# Patient Record
Sex: Female | Born: 1980 | Race: White | Hispanic: No | Marital: Single | Smoking: Never smoker
Health system: Southern US, Community
[De-identification: ages and names within clinical notes are randomized; demographics above are authoritative.]

## PROBLEM LIST (undated history)

## (undated) DIAGNOSIS — Z973 Presence of spectacles and contact lenses: Secondary | ICD-10-CM

## (undated) DIAGNOSIS — T753XXA Motion sickness, initial encounter: Secondary | ICD-10-CM

## (undated) DIAGNOSIS — Z800 Family history of malignant neoplasm of digestive organs: Secondary | ICD-10-CM

## (undated) DIAGNOSIS — Z9889 Other specified postprocedural states: Secondary | ICD-10-CM

## (undated) DIAGNOSIS — E282 Polycystic ovarian syndrome: Secondary | ICD-10-CM

## (undated) DIAGNOSIS — B019 Varicella without complication: Secondary | ICD-10-CM

## (undated) DIAGNOSIS — R519 Headache, unspecified: Secondary | ICD-10-CM

## (undated) DIAGNOSIS — R112 Nausea with vomiting, unspecified: Secondary | ICD-10-CM

## (undated) DIAGNOSIS — J45909 Unspecified asthma, uncomplicated: Secondary | ICD-10-CM

## (undated) DIAGNOSIS — R17 Unspecified jaundice: Secondary | ICD-10-CM

## (undated) DIAGNOSIS — C801 Malignant (primary) neoplasm, unspecified: Secondary | ICD-10-CM

## (undated) DIAGNOSIS — J301 Allergic rhinitis due to pollen: Secondary | ICD-10-CM

## (undated) DIAGNOSIS — T7840XA Allergy, unspecified, initial encounter: Secondary | ICD-10-CM

## (undated) DIAGNOSIS — M719 Bursopathy, unspecified: Secondary | ICD-10-CM

## (undated) DIAGNOSIS — Z8489 Family history of other specified conditions: Secondary | ICD-10-CM

## (undated) HISTORY — DX: Allergic rhinitis due to pollen: J30.1

## (undated) HISTORY — DX: Varicella without complication: B01.9

## (undated) HISTORY — PX: TONSILLECTOMY AND ADENOIDECTOMY: SUR1326

## (undated) HISTORY — DX: Polycystic ovarian syndrome: E28.2

## (undated) HISTORY — PX: MYRINGOTOMY WITH TUBE PLACEMENT: SHX5663

## (undated) HISTORY — DX: Family history of malignant neoplasm of digestive organs: Z80.0

## (undated) HISTORY — DX: Bursopathy, unspecified: M71.9

## (undated) HISTORY — DX: Allergy, unspecified, initial encounter: T78.40XA

## (undated) HISTORY — DX: Unspecified asthma, uncomplicated: J45.909

## (undated) HISTORY — DX: Unspecified jaundice: R17

---

## 2004-02-16 HISTORY — PX: EYE SURGERY: SHX253

## 2009-03-17 ENCOUNTER — Emergency Department: Payer: Self-pay | Admitting: Emergency Medicine

## 2013-01-01 ENCOUNTER — Ambulatory Visit: Payer: Self-pay | Admitting: Unknown Physician Specialty

## 2015-04-16 ENCOUNTER — Ambulatory Visit: Payer: No Typology Code available for payment source | Admitting: Nurse Practitioner

## 2015-04-18 ENCOUNTER — Ambulatory Visit (INDEPENDENT_AMBULATORY_CARE_PROVIDER_SITE_OTHER): Payer: No Typology Code available for payment source | Admitting: Nurse Practitioner

## 2015-04-18 ENCOUNTER — Encounter: Payer: Self-pay | Admitting: Nurse Practitioner

## 2015-04-18 VITALS — BP 102/72 | HR 70 | Temp 97.9°F | Ht 67.25 in | Wt 157.5 lb

## 2015-04-18 DIAGNOSIS — M7071 Other bursitis of hip, right hip: Secondary | ICD-10-CM

## 2015-04-18 DIAGNOSIS — R6889 Other general symptoms and signs: Secondary | ICD-10-CM

## 2015-04-18 DIAGNOSIS — E282 Polycystic ovarian syndrome: Secondary | ICD-10-CM | POA: Diagnosis not present

## 2015-04-18 DIAGNOSIS — R5382 Chronic fatigue, unspecified: Secondary | ICD-10-CM

## 2015-04-18 DIAGNOSIS — Z23 Encounter for immunization: Secondary | ICD-10-CM

## 2015-04-18 DIAGNOSIS — Z7689 Persons encountering health services in other specified circumstances: Secondary | ICD-10-CM

## 2015-04-18 DIAGNOSIS — Z1211 Encounter for screening for malignant neoplasm of colon: Secondary | ICD-10-CM

## 2015-04-18 DIAGNOSIS — J4520 Mild intermittent asthma, uncomplicated: Secondary | ICD-10-CM

## 2015-04-18 DIAGNOSIS — Z7189 Other specified counseling: Secondary | ICD-10-CM

## 2015-04-18 LAB — CBC WITH DIFFERENTIAL/PLATELET
BASOS PCT: 0.7 % (ref 0.0–3.0)
Basophils Absolute: 0.1 10*3/uL (ref 0.0–0.1)
EOS PCT: 0.4 % (ref 0.0–5.0)
Eosinophils Absolute: 0 10*3/uL (ref 0.0–0.7)
HCT: 41.8 % (ref 36.0–46.0)
Hemoglobin: 14.2 g/dL (ref 12.0–15.0)
LYMPHS ABS: 1.6 10*3/uL (ref 0.7–4.0)
Lymphocytes Relative: 18.7 % (ref 12.0–46.0)
MCHC: 34 g/dL (ref 30.0–36.0)
MCV: 87.1 fl (ref 78.0–100.0)
MONOS PCT: 5.4 % (ref 3.0–12.0)
Monocytes Absolute: 0.5 10*3/uL (ref 0.1–1.0)
NEUTROS ABS: 6.4 10*3/uL (ref 1.4–7.7)
NEUTROS PCT: 74.8 % (ref 43.0–77.0)
PLATELETS: 272 10*3/uL (ref 150.0–400.0)
RBC: 4.79 Mil/uL (ref 3.87–5.11)
RDW: 13 % (ref 11.5–15.5)
WBC: 8.5 10*3/uL (ref 4.0–10.5)

## 2015-04-18 LAB — COMPREHENSIVE METABOLIC PANEL
ALT: 12 U/L (ref 0–35)
AST: 15 U/L (ref 0–37)
Albumin: 4.8 g/dL (ref 3.5–5.2)
Alkaline Phosphatase: 41 U/L (ref 39–117)
BUN: 8 mg/dL (ref 6–23)
CHLORIDE: 107 meq/L (ref 96–112)
CO2: 23 meq/L (ref 19–32)
Calcium: 9.4 mg/dL (ref 8.4–10.5)
Creatinine, Ser: 0.72 mg/dL (ref 0.40–1.20)
GFR: 98.77 mL/min (ref >60.00)
GLUCOSE: 69 mg/dL — AB (ref 70–99)
POTASSIUM: 4 meq/L (ref 3.5–5.1)
SODIUM: 139 meq/L (ref 135–145)
Total Bilirubin: 0.9 mg/dL (ref 0.2–1.2)
Total Protein: 6.6 g/dL (ref 6.0–8.3)

## 2015-04-18 LAB — TSH: TSH: 1.48 u[IU]/mL (ref 0.35–4.50)

## 2015-04-18 LAB — VITAMIN B12: Vitamin B-12: 143 pg/mL — ABNORMAL LOW (ref 211–911)

## 2015-04-18 LAB — T4, FREE: Free T4: 0.7 ng/dL (ref 0.60–1.60)

## 2015-04-18 NOTE — Patient Instructions (Addendum)
Welcome to [facility]! Nice to meet you.   We will follow up in 3 months OR sooner if needed after labs.

## 2015-04-18 NOTE — Progress Notes (Signed)
Patient ID: Jackie Miller, female    DOB: 1980-04-11  Age: 35 y.o. MRN: [id number]  CC: No chief complaint on file.   HPI Jackie Miller presents for establishing care CC of fatigue and intolerance to cold.   1) New Pt Info:   Immunizations- Tdap and Flu   Pap- 01/15/2015 Dr. Glennon Mac as [facility]   2) Chronic Problems-  Asthma- uses inhaler as needed  PCOS- OCP  Hay Fever- seasonal   Colon screening 2013 Dr. Vira Agar 5-7 yrs to return, no polyps  Right hip bursitis- meloxicam, had an injection    3) Acute Problems-  Last few years, feels cold all of the time, hates the cold weather  Feels very fatigued 7-9 hours on avg sleep, not a restful sleep   PHQ-2 negative   Steadily gaining weight over last 3 years 20-30 lbs     History Jackie Miller has a past medical history of Asthma; Chicken pox; Jaundice; Hay fever; PCOS (polycystic ovarian syndrome); and Bursitis.   She has past surgical history that includes Tonsillectomy and adenoidectomy; Eye surgery (Bilateral, 2006); and Myringotomy with tube placement.   Her family history includes Breast cancer in her maternal grandmother; Colon cancer (age of onset: 93) in her mother; Hypertension in her father; Lung cancer in her paternal grandfather.She reports that she has never smoked. She has never used smokeless tobacco. She reports that she drinks alcohol. She reports that she does not use illicit drugs.  No outpatient prescriptions prior to visit.   No facility-administered medications prior to visit.    ROS Review of Systems  Constitutional: Positive for appetite change and fatigue. Negative for fever, chills, diaphoresis, activity change and unexpected weight change.  Eyes: Negative for visual disturbance.  Respiratory: Negative for chest tightness and shortness of breath.   Cardiovascular: Negative for chest pain.  Gastrointestinal: Negative for nausea, vomiting and diarrhea.  Endocrine: Positive for cold intolerance.  Neurological:  Negative for headaches.  Psychiatric/Behavioral: Positive for sleep disturbance. Negative for suicidal ideas. The patient is nervous/anxious.     Objective:  BP 102/72 mmHg  Pulse 70  Temp(Src) 97.9 F (36.6 C) (Oral)  Ht 5' 7.25" (1.708 m)  Wt 157 lb 8 oz (71.442 kg)  BMI 24.49 kg/m2  SpO2 99%  LMP 03/25/2015  Physical Exam  Constitutional: She is oriented to person, place, and time. She appears well-developed and well-nourished. No distress.  HENT:  Head: Normocephalic and atraumatic.  Right Ear: External ear normal.  Left Ear: External ear normal.  Eyes: Conjunctivae and EOM are normal. Pupils are equal, round, and reactive to light. Right eye exhibits no discharge. Left eye exhibits no discharge. No scleral icterus.  Cardiovascular: Normal rate, regular rhythm and normal heart sounds.  Exam reveals no gallop and no friction rub.   No murmur heard. Pulmonary/Chest: Effort normal and breath sounds normal. No respiratory distress. She has no wheezes. She has no rales. She exhibits no tenderness.  Neurological: She is alert and oriented to person, place, and time. No cranial nerve deficit. She exhibits normal muscle tone. Coordination normal.  Skin: Skin is warm and dry. No rash noted. She is not diaphoretic.  Psychiatric: She has a normal mood and affect. Her behavior is normal. Judgment and thought content normal.   Assessment & Plan:   Diagnoses and all orders for this visit:  Chronic fatigue -     Comprehensive metabolic panel -     123456 -     CBC with Differential/Platelet -  TSH -     T4, free  Need for prophylactic vaccination with combined diphtheria-tetanus-pertussis (DTP) vaccine -     Tdap vaccine greater than or equal to 7yo IM  Encounter to establish care  PCOS (polycystic ovarian syndrome)  Intolerance to cold -     Comprehensive metabolic panel -     123456 -     CBC with Differential/Platelet -     TSH -     T4, free  Asthma, mild intermittent,  uncomplicated  Special screening for malignant neoplasms, colon  Bursitis of right hip  Other orders -     Flu Vaccine QUAD 36+ mos IM   I am having Jackie Miller maintain her meloxicam, montelukast, PROAIR HFA, and Norgestimate-Eth Estradiol (MONONESSA PO).  Meds ordered this encounter  Medications  . meloxicam (MOBIC) 15 MG tablet    Sig:   . montelukast (SINGULAIR) 10 MG tablet    Sig:   . PROAIR HFA 108 (90 Base) MCG/ACT inhaler    Sig: as needed.  Donnetta Hail Estradiol (MONONESSA PO)    Sig: Take by mouth.     Follow-up: Return in about 3 months (around 07/19/2015) for Follow up.

## 2015-04-18 NOTE — Progress Notes (Signed)
Pre visit review using our clinic review tool, if applicable. No additional management support is needed unless otherwise documented below in the visit note. 

## 2015-04-21 ENCOUNTER — Telehealth: Payer: Self-pay | Admitting: *Deleted

## 2015-04-21 NOTE — Telephone Encounter (Signed)
Patient requested lab results Pt contact [phone]

## 2015-04-22 NOTE — Telephone Encounter (Signed)
Notified pt of test results, pt verbalized understanding and appreciation. Pt was also scheduled for nurse visit for first b 12 injection 04/23/15

## 2015-04-23 ENCOUNTER — Ambulatory Visit (INDEPENDENT_AMBULATORY_CARE_PROVIDER_SITE_OTHER): Payer: No Typology Code available for payment source | Admitting: Surgical

## 2015-04-23 DIAGNOSIS — E538 Deficiency of other specified B group vitamins: Secondary | ICD-10-CM | POA: Diagnosis not present

## 2015-04-23 MED ORDER — CYANOCOBALAMIN 1000 MCG/ML IJ SOLN
1000.0000 ug | Freq: Once | INTRAMUSCULAR | Status: AC
  Administered 2015-04-23: 1000 ug via INTRAMUSCULAR

## 2015-04-23 NOTE — Progress Notes (Signed)
Patient given B 12 injection in left deltoid. Patient tolerated well.

## 2015-04-27 DIAGNOSIS — Z23 Encounter for immunization: Secondary | ICD-10-CM | POA: Insufficient documentation

## 2015-04-27 DIAGNOSIS — R6889 Other general symptoms and signs: Secondary | ICD-10-CM | POA: Insufficient documentation

## 2015-04-27 DIAGNOSIS — M7071 Other bursitis of hip, right hip: Secondary | ICD-10-CM | POA: Insufficient documentation

## 2015-04-27 DIAGNOSIS — E282 Polycystic ovarian syndrome: Secondary | ICD-10-CM | POA: Insufficient documentation

## 2015-04-27 DIAGNOSIS — J45909 Unspecified asthma, uncomplicated: Secondary | ICD-10-CM | POA: Insufficient documentation

## 2015-04-27 DIAGNOSIS — R5382 Chronic fatigue, unspecified: Secondary | ICD-10-CM | POA: Insufficient documentation

## 2015-04-27 DIAGNOSIS — Z1211 Encounter for screening for malignant neoplasm of colon: Secondary | ICD-10-CM | POA: Insufficient documentation

## 2015-04-27 NOTE — Assessment & Plan Note (Signed)
Stable currently. Has not had to use proair HFA recently, uses singulair daily

## 2015-04-27 NOTE — Assessment & Plan Note (Signed)
Recent intolerance to cold  Feels cold often despite others feeling "normal temp"  Checking labs today

## 2015-04-27 NOTE — Assessment & Plan Note (Signed)
New onset, but chronic over the last few years. Getting adequate sleep and not due to depression Pt would like lab work Checking thyroid and B12

## 2015-04-27 NOTE — Assessment & Plan Note (Signed)
Made UTD on Tdap today

## 2015-04-27 NOTE — Assessment & Plan Note (Signed)
Stable currently. Pt was on mobic and had an injection in the past. No recent flare

## 2015-04-27 NOTE — Assessment & Plan Note (Signed)
Colon cancer in family hx Pt has been screened in 2013 by Dr. Vira Agar and will return in 5-7 yrs Denies polyps

## 2015-04-27 NOTE — Assessment & Plan Note (Signed)
Discussed acute and chronic issues. Reviewed health maintenance measures, PFSHx, and immunizations. Obtain records from previous facility.   

## 2015-04-27 NOTE — Assessment & Plan Note (Signed)
Stable. Pt is on OCP. Has Dr. Glennon Mac at [facility].GYN and will keep

## 2015-04-30 ENCOUNTER — Other Ambulatory Visit: Payer: Self-pay

## 2015-04-30 ENCOUNTER — Ambulatory Visit (INDEPENDENT_AMBULATORY_CARE_PROVIDER_SITE_OTHER): Payer: No Typology Code available for payment source

## 2015-04-30 DIAGNOSIS — E538 Deficiency of other specified B group vitamins: Secondary | ICD-10-CM | POA: Diagnosis not present

## 2015-04-30 MED ORDER — CYANOCOBALAMIN 1000 MCG/ML IJ SOLN: 1000.0000 ug | INTRAMUSCULAR | Status: DC

## 2015-04-30 MED ORDER — CYANOCOBALAMIN 1000 MCG/ML IJ SOLN
1000.0000 ug | Freq: Once | INTRAMUSCULAR | Status: AC
  Administered 2015-04-30: 1000 ug via INTRAMUSCULAR

## 2015-04-30 MED ORDER — "SYRINGE/NEEDLE (DISP) 25G X 1"" 3 ML MISC": Status: DC

## 2015-04-30 NOTE — Progress Notes (Signed)
Patient came in for her b12 injection.  Received in her Right deltoid.  Patient tolerated well.   Patient is requesting to have her b12 injections to be given at home with her family member that is a Therapist, sports. Ordered supplies and meds per her request, when would you like her to return for labs?  Please advise.

## 2015-05-01 ENCOUNTER — Other Ambulatory Visit: Payer: Self-pay | Admitting: Nurse Practitioner

## 2015-05-01 DIAGNOSIS — E538 Deficiency of other specified B group vitamins: Secondary | ICD-10-CM

## 2015-05-01 MED ORDER — CYANOCOBALAMIN 1000 MCG/ML IJ SOLN: 1000.0000 ug | Freq: Once | INTRAMUSCULAR | Status: DC

## 2015-05-01 MED ORDER — "SYRINGE/NEEDLE (DISP) 25G X 1"" 3 ML MISC": 1.0000 | Status: DC

## 2015-05-01 MED ORDER — CYANOCOBALAMIN 1000 MCG/ML IJ SOLN: 1000.0000 ug | INTRAMUSCULAR | Status: DC

## 2015-05-01 MED ORDER — "SYRINGE/NEEDLE (DISP) 25G X 1"" 3 ML MISC": Status: DC

## 2015-05-01 NOTE — Addendum Note (Signed)
Addended by: Lurlean Nanny on: 05/01/2015 09:33 AM   Modules accepted: Orders

## 2015-05-01 NOTE — Addendum Note (Signed)
Addended by: Lurlean Nanny on: 05/01/2015 10:08 AM   Modules accepted: Orders

## 2015-05-01 NOTE — Telephone Encounter (Signed)
Rx sent through [facility]  

## 2015-05-01 NOTE — Telephone Encounter (Signed)
Rx faxed

## 2015-05-01 NOTE — Progress Notes (Signed)
Note reviewed for B12 injection by T. Roman, Therapist, sports  Pt's 2nd B12 of 3 weekly. Will start Monthly at home and repeat labs in 12 weeks from start. Will have CMA call and set up labs for that time period.   Lorane Gell, NP-C  05/01/2015 1309

## 2015-05-07 ENCOUNTER — Ambulatory Visit: Payer: No Typology Code available for payment source

## 2015-07-09 ENCOUNTER — Other Ambulatory Visit: Payer: No Typology Code available for payment source

## 2015-08-08 ENCOUNTER — Other Ambulatory Visit (INDEPENDENT_AMBULATORY_CARE_PROVIDER_SITE_OTHER): Payer: Managed Care, Other (non HMO)

## 2015-08-08 ENCOUNTER — Encounter (INDEPENDENT_AMBULATORY_CARE_PROVIDER_SITE_OTHER): Payer: Self-pay

## 2015-08-08 DIAGNOSIS — E538 Deficiency of other specified B group vitamins: Secondary | ICD-10-CM

## 2015-08-08 LAB — VITAMIN B12: VITAMIN B 12: 146 pg/mL — AB (ref 211–911)

## 2015-08-29 ENCOUNTER — Telehealth: Payer: Self-pay | Admitting: *Deleted

## 2015-08-29 MED ORDER — CYANOCOBALAMIN 1000 MCG/ML IJ SOLN: 1000.0000 ug | Freq: Once | INTRAMUSCULAR | Status: DC

## 2015-08-29 NOTE — Telephone Encounter (Signed)
Please advise thanks,

## 2015-08-29 NOTE — Telephone Encounter (Signed)
Patient has been doing monthly at home and had initially done 3 week shots.  What would you like her to do now, she will continue to complete home injections.

## 2015-08-29 NOTE — Telephone Encounter (Signed)
Sent refill to pharmacy, spoke with patient and scheduled recheck of labs, thanks

## 2015-08-29 NOTE — Telephone Encounter (Signed)
Patient requested her b12 lab result from 08-08-15

## 2015-08-29 NOTE — Telephone Encounter (Signed)
Once a week for 4 weeks. Then recheck.

## 2015-08-29 NOTE — Telephone Encounter (Signed)
Result low. May need increase in frequency of B12. Will need appt.

## 2015-09-18 ENCOUNTER — Telehealth: Payer: Self-pay | Admitting: *Deleted

## 2015-09-18 MED ORDER — CYANOCOBALAMIN 1000 MCG/ML IJ SOLN: INTRAMUSCULAR | 11 refills | Status: DC

## 2015-09-18 NOTE — Telephone Encounter (Signed)
Resent rx

## 2015-09-18 NOTE — Telephone Encounter (Signed)
Patient stated that her B12 injection Rx could not be filled by the pharmacy due to incorrect dosage. Pt was advised to have injection once a week for 4 weeks, her Rx was for once a month. She requested a Rx sent over to [facility]  Pt contact [phone]

## 2015-09-24 ENCOUNTER — Other Ambulatory Visit: Payer: Managed Care, Other (non HMO)

## 2015-10-15 ENCOUNTER — Telehealth: Payer: Self-pay

## 2015-10-15 DIAGNOSIS — E538 Deficiency of other specified B group vitamins: Secondary | ICD-10-CM

## 2015-10-15 NOTE — Telephone Encounter (Signed)
Pt coming for labs 10/16/15. Need future orders placed. Looks like maybe for B12 check. 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

## 2015-10-16 ENCOUNTER — Other Ambulatory Visit: Payer: Managed Care, Other (non HMO)

## 2015-10-16 NOTE — Telephone Encounter (Signed)
Lab ordered.

## 2015-10-16 NOTE — Telephone Encounter (Signed)
Place order for B12, Dx B12 deficiency.

## 2015-10-23 ENCOUNTER — Encounter (INDEPENDENT_AMBULATORY_CARE_PROVIDER_SITE_OTHER): Payer: Self-pay

## 2015-10-23 ENCOUNTER — Other Ambulatory Visit (INDEPENDENT_AMBULATORY_CARE_PROVIDER_SITE_OTHER): Payer: Managed Care, Other (non HMO)

## 2015-10-23 DIAGNOSIS — E538 Deficiency of other specified B group vitamins: Secondary | ICD-10-CM

## 2015-10-23 LAB — VITAMIN B12: Vitamin B-12: >1500 pg/mL — ABNORMAL HIGH (ref 211–911)

## 2015-10-30 ENCOUNTER — Telehealth: Payer: Self-pay | Admitting: *Deleted

## 2015-10-30 NOTE — Telephone Encounter (Signed)
Pt was given a B12 injection the day before lab appointment that was the reason it was high. Pt is receiving injections at home.

## 2015-10-30 NOTE — Telephone Encounter (Signed)
Please do not have them test that close to injection.

## 2015-10-30 NOTE — Telephone Encounter (Signed)
Patient requested lab results Pt contact [phone]

## 2015-11-14 ENCOUNTER — Telehealth: Payer: Self-pay | Admitting: Family Medicine

## 2015-11-14 NOTE — Telephone Encounter (Signed)
Pt lvm stating that she wants to be referred to an endocrinologist at [facility].No referral in. Please advise.

## 2015-11-17 NOTE — Telephone Encounter (Signed)
Can you please schedule pt to be seen.

## 2015-11-17 NOTE — Telephone Encounter (Signed)
I am unaware of this. Will need an appt.

## 2015-11-17 NOTE — Telephone Encounter (Signed)
Ok. I left pt a message to call the office. Thank you!

## 2015-11-17 NOTE — Telephone Encounter (Signed)
There is nothing in the last note that shows this was talked about. Recommendations on referral?

## 2015-11-18 NOTE — Telephone Encounter (Signed)
Pt called back and is scheduled. Thank you!

## 2015-11-20 ENCOUNTER — Ambulatory Visit: Payer: Managed Care, Other (non HMO) | Admitting: Family Medicine

## 2015-12-05 ENCOUNTER — Ambulatory Visit (INDEPENDENT_AMBULATORY_CARE_PROVIDER_SITE_OTHER): Payer: Managed Care, Other (non HMO) | Admitting: Family Medicine

## 2015-12-05 ENCOUNTER — Encounter: Payer: Self-pay | Admitting: Family Medicine

## 2015-12-05 VITALS — BP 112/82 | HR 104 | Temp 98.0°F | Wt 155.4 lb

## 2015-12-05 DIAGNOSIS — E538 Deficiency of other specified B group vitamins: Secondary | ICD-10-CM

## 2015-12-05 DIAGNOSIS — R6889 Other general symptoms and signs: Secondary | ICD-10-CM

## 2015-12-05 DIAGNOSIS — R5382 Chronic fatigue, unspecified: Secondary | ICD-10-CM

## 2015-12-05 DIAGNOSIS — E282 Polycystic ovarian syndrome: Secondary | ICD-10-CM

## 2015-12-05 NOTE — Assessment & Plan Note (Addendum)
Establish problem, worsening. Etiology remains unclear. Likely component of vitamin B12 deficiency. Etiology of B12 deficiency is unclear. Differential is very broad. No evidence of pharmacologic issues, cardiopulmonary disease, infectious disease. She denies issues with depression. No signs or symptoms to suggest underlying malignancy or rheumatologic process. Possible underlying endocrine disorder. Sending to Endo.

## 2015-12-05 NOTE — Progress Notes (Signed)
Pre visit review using our clinic review tool, if applicable. No additional management support is needed unless otherwise documented below in the visit note. 

## 2015-12-05 NOTE — Progress Notes (Signed)
Subjective:  Patient ID: Jackie Miller, female    DOB: 01-26-1981  Age: 35 y.o. MRN: [id number]  CC: Fatigue, cold intolerance  HPI:  35 year old female presents with the above complaints.  Patient a two-year history of severe fatigue and cold intolerance. Patient has had laboratory workup which was remarkable for vitamin B12 deficiency. Thyroid studies were normal earlier this year. She states that she has a brief improvement in her fatigue the first few days after B12 injection but then it returns. The etiology of her symptoms have been unclear. No reports of fever, chills. She does report that she's been slowly gaining weight. No night sweats. No changes in her bowel or bladder habits. Normal appetite.  Additionally, patient states that she has never had regular menstrual cycles. She states that she does have some mild hirsutism. She has seen her OB/GYN regarding this and was diagnosed with PCOS (she had a negative ultrasound).   Patient is very concerned about her symptoms. She denies issues with depression. Patient desires to see an endocrinologist. She would like to discuss this today.  Social Hx   Social History   Social History  . Marital status: Single    Spouse name: N/A  . Number of children: N/A  . Years of education: N/A   Social History Main Topics  . Smoking status: Never Smoker  . Smokeless tobacco: Never Used  . Alcohol use 0.0 - 0.6 oz/week  . Drug use: No  . Sexual activity: No   Other Topics Concern  . 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  Endocrine: Positive for cold intolerance.  Genitourinary: Positive for menstrual problem.   Objective:  BP 112/82 (BP Location: Right Arm, Patient Position: Sitting, Cuff Size: Normal)   Pulse (!) 104   Temp 98 F (36.7 C) (Oral)   Wt 155 lb 6 oz (70.5 kg)   SpO2 99%    BMI 24.15 kg/m   BP/Weight A999333 [id number]  Systolic BP [id number]  Diastolic BP 82 72  Wt. (Lbs) 155.38 157.5  BMI 24.15 24.49   Physical Exam  Constitutional: She is oriented to person, place, and time. She appears well-developed. No distress.  Neck: Neck supple. No thyromegaly present.  Cardiovascular: Normal rate and regular rhythm.   Pulmonary/Chest: Effort normal. She has no wheezes. She has no rales.  Neurological: She is alert and oriented to person, place, and time.  Psychiatric:  Flat affect.  Vitals reviewed.  Lab Results  Component Value Date   WBC 8.5 04/18/2015   HGB 14.2 04/18/2015   HCT 41.8 04/18/2015   PLT 272.0 04/18/2015   GLUCOSE 69 (L) 04/18/2015   ALT 12 04/18/2015   AST 15 04/18/2015   NA 139 04/18/2015   K 4.0 04/18/2015   CL 107 04/18/2015   CREATININE 0.72 04/18/2015   BUN 8 04/18/2015   CO2 23 04/18/2015   TSH 1.48 04/18/2015   Assessment & Plan:   Problem List Items Addressed This Visit    PCOS (polycystic ovarian syndrome)   Relevant Orders   Ambulatory referral to Endocrinology   Chronic fatigue - Primary    Establish from, worsening. Etiology remains unclear. Likely component of vitamin B12 deficiency. Etiology of B12 deficiency is unclear. Differential is very broad. No evidence of pharmacologic issues, cardiopulmonary disease, infectious  disease. She denies issues with depression. No signs or symptoms to suggest underlying malignancy or rheumatologic process. Possible underlying endocrine disorder. Sending to Endo.       Other Visit Diagnoses    Cold intolerance       Vitamin B12 deficiency          Follow-up: PRN  15 minutes were spent face-to-face with the patient during this encounter and over half of that time was spent discussing potential etiologies for her symptoms, work up by primary care and referral.  [place]

## 2015-12-05 NOTE — Patient Instructions (Signed)
We will call with the referral.  Call with concerns/issues  Take care  Dr. Lacinda Axon

## 2016-01-28 ENCOUNTER — Ambulatory Visit (INDEPENDENT_AMBULATORY_CARE_PROVIDER_SITE_OTHER): Payer: Managed Care, Other (non HMO) | Admitting: Family Medicine

## 2016-01-28 ENCOUNTER — Encounter: Payer: Self-pay | Admitting: Family Medicine

## 2016-01-28 DIAGNOSIS — J209 Acute bronchitis, unspecified: Secondary | ICD-10-CM | POA: Diagnosis not present

## 2016-01-28 MED ORDER — DOXYCYCLINE HYCLATE 100 MG PO TABS: 100.0000 mg | ORAL_TABLET | Freq: Two times a day (BID) | ORAL | 0 refills | Status: DC

## 2016-01-28 MED ORDER — PREDNISONE 50 MG PO TABS: ORAL_TABLET | ORAL | 0 refills | Status: DC

## 2016-01-28 MED ORDER — HYDROCOD POLST-CPM POLST ER 10-8 MG/5ML PO SUER: 5.0000 mL | Freq: Two times a day (BID) | ORAL | 0 refills | Status: DC | PRN

## 2016-01-28 NOTE — Progress Notes (Signed)
Pre visit review using our clinic review tool, if applicable. No additional management support is needed unless otherwise documented below in the visit note. 

## 2016-01-28 NOTE — Assessment & Plan Note (Signed)
New acute problem. Treating with tussionex and Prednisone. PRN Albuterol. Advised that this is likely viral and she will not likely need antibiotics. I did give her a prescription for doxycycline if she fails to improve or worsens. She is in agreement to not take this unless truly needed.

## 2016-01-28 NOTE — Patient Instructions (Signed)
Acute bronchitis. Likely viral.  Take the medications.  Avoid the antibiotic unless you worsen or fail to improve.  Take care  Dr. Lacinda Axon

## 2016-01-28 NOTE — Progress Notes (Signed)
Subjective:  Patient ID: Jackie Miller, female    DOB: May 02, 1980  Age: 35 y.o. MRN: [id number]  CC: Cough  HPI:  35 year old female with a history of asthma presents with the above complaint.  Patient reports a two-week history of cough. Cough is dry/nonproductive. Cough is severe. She reports associated shortness of breath. She has been using albuterol with some brief improvement. No fevers or chills. No other associated symptoms. No other complaints or concerns at this time. 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  Social Hx   Social History   Social History  . Marital status: Single    Spouse name: N/A  . Number of children: N/A  . Years of education: N/A   Social History Main Topics  . Smoking status: Never Smoker  . Smokeless tobacco: Never Used  . Alcohol use 0.0 - 0.6 oz/week  . Drug use: No  . Sexual activity: No   Other Topics Concern  . 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  Respiratory: Positive for cough and shortness of breath.    Objective:  BP 105/64 (BP Location: Left Arm, Patient Position: Sitting, Cuff Size: Normal)   Pulse 73   Temp 97.8 F (36.6 C) (Oral)   Resp 14   Wt 156 lb 8 oz (71 kg)   SpO2 100%   BMI 24.33 kg/m   BP/Weight 01/28/2016 A999333 [id number]  Systolic BP 123456 [id number] A999333  Diastolic BP 64 82 72  Wt. (Lbs) 156.5 155.38 157.5  BMI 24.33 24.15 24.49    Physical Exam  Constitutional: She is oriented to person, place, and time. She appears well-developed. No distress.  Cardiovascular: Normal rate and regular rhythm.   Pulmonary/Chest: Effort normal and breath sounds normal. She has no wheezes.  Neurological: She is alert and oriented to person, place, and time.  Psychiatric:  Flat affect.  Vitals reviewed.  Lab Results  Component  Value Date   WBC 8.5 04/18/2015   HGB 14.2 04/18/2015   HCT 41.8 04/18/2015   PLT 272.0 04/18/2015   GLUCOSE 69 (L) 04/18/2015   ALT 12 04/18/2015   AST 15 04/18/2015   NA 139 04/18/2015   K 4.0 04/18/2015   CL 107 04/18/2015   CREATININE 0.72 04/18/2015   BUN 8 04/18/2015   CO2 23 04/18/2015   TSH 1.48 04/18/2015    Assessment & Plan:   Problem List Items Addressed This Visit    Acute bronchitis    New acute problem. Treating with tussionex and Prednisone. PRN Albuterol. Advised that this is likely viral and she will not likely need antibiotics. I did give her a prescription for doxycycline if she fails to improve or worsens. She is in agreement to not take this unless truly needed.         Meds ordered this encounter  Medications  . chlorpheniramine-HYDROcodone (TUSSIONEX PENNKINETIC ER) 10-8 MG/5ML SUER    Sig: Take 5 mLs by mouth every 12 (twelve) hours as needed.    Dispense:  115 mL    Refill:  0  . predniSONE (DELTASONE) 50 MG tablet    Sig: 1 tablet daily x 5 days.    Dispense:  5 tablet    Refill:  0  . doxycycline (VIBRA-TABS) 100  MG tablet    Sig: Take 1 tablet (100 mg total) by mouth 2 (two) times daily.    Dispense:  14 tablet    Refill:  0    Follow-up: PRN  [place]

## 2016-02-25 DIAGNOSIS — E282 Polycystic ovarian syndrome: Secondary | ICD-10-CM | POA: Insufficient documentation

## 2016-03-15 ENCOUNTER — Encounter: Payer: Self-pay | Admitting: Emergency Medicine

## 2016-03-15 ENCOUNTER — Emergency Department
Admission: EM | Admit: 2016-03-15 | Discharge: 2016-03-15 | Disposition: A | Discharge status: Home / Self Care | Payer: Managed Care, Other (non HMO) | Attending: Emergency Medicine | Admitting: Emergency Medicine

## 2016-03-15 DIAGNOSIS — J45909 Unspecified asthma, uncomplicated: Secondary | ICD-10-CM | POA: Insufficient documentation

## 2016-03-15 DIAGNOSIS — K529 Noninfective gastroenteritis and colitis, unspecified: Secondary | ICD-10-CM | POA: Diagnosis not present

## 2016-03-15 DIAGNOSIS — R112 Nausea with vomiting, unspecified: Secondary | ICD-10-CM | POA: Diagnosis present

## 2016-03-15 DIAGNOSIS — Z791 Long term (current) use of non-steroidal anti-inflammatories (NSAID): Secondary | ICD-10-CM | POA: Insufficient documentation

## 2016-03-15 LAB — COMPREHENSIVE METABOLIC PANEL
ALBUMIN: 4.3 g/dL (ref 3.5–5.0)
ALT: 13 U/L — ABNORMAL LOW (ref 14–54)
ANION GAP: 7 (ref 5–15)
AST: 20 U/L (ref 15–41)
Alkaline Phosphatase: 44 U/L (ref 38–126)
BILIRUBIN TOTAL: 1.6 mg/dL — AB (ref 0.3–1.2)
BUN: 7 mg/dL (ref 6–20)
CO2: 23 mmol/L (ref 22–32)
Calcium: 8.9 mg/dL (ref 8.9–10.3)
Chloride: 107 mmol/L (ref 101–111)
Creatinine, Ser: 0.73 mg/dL (ref 0.44–1.00)
GFR calc Af Amer: >60 mL/min (ref >60)
GFR calc non Af Amer: >60 mL/min (ref >60)
Glucose, Bld: 109 mg/dL — ABNORMAL HIGH (ref 65–99)
POTASSIUM: 4.1 mmol/L (ref 3.5–5.1)
Sodium: 137 mmol/L (ref 135–145)
TOTAL PROTEIN: 6.8 g/dL (ref 6.5–8.1)

## 2016-03-15 LAB — URINALYSIS, COMPLETE (UACMP) WITH MICROSCOPIC
BILIRUBIN URINE: NEGATIVE
Glucose, UA: NEGATIVE mg/dL
HGB URINE DIPSTICK: NEGATIVE
Ketones, ur: NEGATIVE mg/dL
Leukocytes, UA: NEGATIVE
NITRITE: NEGATIVE
Protein, ur: 30 mg/dL — AB
Specific Gravity, Urine: 1.02 (ref 1.005–1.030)
pH: 9 — ABNORMAL HIGH (ref 5.0–8.0)

## 2016-03-15 LAB — CBC
HEMATOCRIT: 39.8 % (ref 35.0–47.0)
Hemoglobin: 14 g/dL (ref 12.0–16.0)
MCH: 29.9 pg (ref 26.0–34.0)
MCHC: 35.2 g/dL (ref 32.0–36.0)
MCV: 85.1 fL (ref 80.0–100.0)
Platelets: 199 10*3/uL (ref 150–440)
RBC: 4.68 MIL/uL (ref 3.80–5.20)
RDW: 12.8 % (ref 11.5–14.5)
WBC: 10.8 10*3/uL (ref 3.6–11.0)

## 2016-03-15 LAB — LIPASE, BLOOD: Lipase: 27 U/L (ref 11–51)

## 2016-03-15 LAB — POCT PREGNANCY, URINE: PREG TEST UR: NEGATIVE

## 2016-03-15 MED ORDER — ONDANSETRON HCL 4 MG PO TABS: 4.0000 mg | ORAL_TABLET | Freq: Three times a day (TID) | ORAL | 0 refills | Status: DC | PRN

## 2016-03-15 MED ORDER — SODIUM CHLORIDE 0.9 % IV BOLUS (SEPSIS)
1000.0000 mL | Freq: Once | INTRAVENOUS | Status: AC
  Administered 2016-03-15: 1000 mL via INTRAVENOUS

## 2016-03-15 MED ORDER — ONDANSETRON 4 MG PO TBDP
4.0000 mg | ORAL_TABLET | Freq: Once | ORAL | Status: AC | PRN
  Administered 2016-03-15: 4 mg via ORAL
  Filled 2016-03-15: qty 1

## 2016-03-15 MED ORDER — ONDANSETRON HCL 4 MG/2ML IJ SOLN
4.0000 mg | Freq: Once | INTRAMUSCULAR | Status: AC
  Administered 2016-03-15: 4 mg via INTRAVENOUS
  Filled 2016-03-15: qty 2

## 2016-03-15 NOTE — ED Notes (Signed)
Iv dc'ed.  D/c inst to pt 

## 2016-03-15 NOTE — ED Provider Notes (Signed)
[facility] Emergency Department Provider Note  ____________________________________________   I have reviewed the triage vital signs and the nursing notes.   HISTORY  Chief Complaint Emesis and Abdominal Pain   History limited by: Not Limited   HPI Jackie Miller is a 36 y.o. female who presents to the emergency department today because of concerns for vomiting and diarrhea. Her symptoms started this morning. She has had multiple episodes of diarrhea and vomiting. Greater than 10 for both. Both of been nonbloody. It was initially accompanied by some abdominal discomfort which was generalized throughout her abdomen. She denies any current abdominal pain. She denies any fevers.   Past Medical History:  Diagnosis Date  . Asthma   . Bursitis   . Chicken pox   . Hay fever   . Jaundice    as a baby  . PCOS (polycystic ovarian syndrome)     Patient Active Problem List   Diagnosis Date Noted  . Acute bronchitis 01/28/2016  . Chronic fatigue 04/27/2015  . PCOS (polycystic ovarian syndrome) 04/27/2015  . 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 Asthma 04/27/2015    Past Surgical History:  Procedure Laterality Date  . EYE SURGERY Bilateral 2006   lasik's  . MYRINGOTOMY WITH TUBE PLACEMENT    . TONSILLECTOMY AND ADENOIDECTOMY      Prior to Admission medications   Medication Sig Start Date End Date Taking? Authorizing Provider  chlorpheniramine-HYDROcodone (TUSSIONEX PENNKINETIC ER) 10-8 MG/5ML SUER Take 5 mLs by mouth every 12 (twelve) hours as needed. 01/28/16   Coral Spikes, DO  cyanocobalamin (,VITAMIN B-12,) 1000 MCG/ML injection 1000mg  IM once a week for four weeks then every 30days there after. 09/18/15   Coral Spikes, DO  doxycycline (VIBRA-TABS) 100 MG tablet Take 1 tablet (100 mg total) by mouth 2 (two) times daily. 01/28/16   Coral Spikes, DO  meloxicam (MOBIC) 15 MG tablet  10/03/14   Historical Provider, MD  montelukast (SINGULAIR) 10 MG tablet   10/03/14   Historical Provider, MD  Norgestimate-Eth Estradiol (MONONESSA PO) Take by mouth.    Historical Provider, MD  predniSONE (DELTASONE) 50 MG tablet 1 tablet daily x 5 days. 01/28/16   Jayce G Cook, DO  PROAIR HFA 108 [phone] Base) MCG/ACT inhaler as needed. 01/27/15   Historical Provider, MD  SYRINGE-NEEDLE, DISP, 3 ML 25G X 1" 3 ML MISC Inject 1 each into the muscle once a week. 05/01/15   Rubbie Battiest, NP    Allergies Amoxicillin and Bacitracin  Family History  Problem Relation Age of Onset  . Colon cancer Mother 69  . Hypertension Father   . Breast cancer Maternal Grandmother   . Lung cancer Paternal Grandfather     Social History Social History  Substance Use Topics  . Smoking status: Never Smoker  . Smokeless tobacco: Never Used  . Alcohol use 0.0 - 0.6 oz/week    Review of Systems  Constitutional: Negative for fever. Cardiovascular: Negative for chest pain. Respiratory: Negative for shortness of breath. Gastrointestinal: Positive for vomiting and diarrhea. Genitourinary: Negative for dysuria. Musculoskeletal: Negative for back pain. Skin: Negative for rash. Neurological: Negative for headaches, focal weakness or numbness.  10-point ROS otherwise negative.  ____________________________________________   PHYSICAL EXAM:  VITAL SIGNS: ED Triage Vitals  Enc Vitals Group     BP 03/15/16 1451 122/71     Pulse Rate 03/15/16 1451 (!) 113     Resp 03/15/16 1451 18     Temp 03/15/16  1451 99 F (37.2 C)     Temp Source 03/15/16 1451 Oral     SpO2 03/15/16 1451 100 %     Weight 03/15/16 1454 155 lb (70.3 kg)     Height 03/15/16 1454 5\' 7"  (1.702 m)     Head Circumference --      Peak Flow --      Pain Score 03/15/16 1454 4     Pain Loc --      Pain Edu? --      Excl. in [place]? --      Constitutional: Alert and oriented. Well appearing and in no distress. Eyes: Conjunctivae are normal. Normal extraocular movements. ENT   Head: Normocephalic and  atraumatic.   Nose: No congestion/rhinnorhea.   Mouth/Throat: Mucous membranes are moist.   Neck: No stridor. Hematological/Lymphatic/Immunilogical: No cervical lymphadenopathy. Cardiovascular: Normal rate, regular rhythm.  No murmurs, rubs, or gallops.  Respiratory: Normal respiratory effort without tachypnea nor retractions. Breath sounds are clear and equal bilaterally. No wheezes/rales/rhonchi. Gastrointestinal: Soft and non tender. No rebound. No guarding.  Genitourinary: Deferred Musculoskeletal: Normal range of motion in all extremities. No lower extremity edema. Neurologic:  Normal speech and language. No gross focal neurologic deficits are appreciated.  Skin:  Skin is warm, dry and intact. No rash noted. Psychiatric: Mood and affect are normal. Speech and behavior are normal. Patient exhibits appropriate insight and judgment.  ____________________________________________    LABS (pertinent positives/negatives)  Labs Reviewed  COMPREHENSIVE METABOLIC PANEL - Abnormal; Notable for the following:       Result Value   Glucose, Bld 109 (*)    ALT 13 (*)    Total Bilirubin 1.6 (*)    All other components within normal limits  URINALYSIS, COMPLETE (UACMP) WITH MICROSCOPIC - Abnormal; Notable for the following:    Color, Urine YELLOW (*)    APPearance CLEAR (*)    pH 9.0 (*)    Protein, ur 30 (*)    Bacteria, UA RARE (*)    Squamous Epithelial / LPF 0-5 (*)    All other components within normal limits  LIPASE, BLOOD  CBC  POC URINE PREG, ED  POCT PREGNANCY, URINE     ____________________________________________   EKG  None  ____________________________________________    RADIOLOGY  None  ____________________________________________   PROCEDURES  Procedures  ____________________________________________   INITIAL IMPRESSION / ASSESSMENT AND PLAN / ED COURSE  Pertinent labs & imaging results that were available during my care of the patient  were reviewed by me and considered in my medical decision making (see chart for details).  Patient with nausea, vomiting and diarrhea. Blood work without any concerning leukocytosis or elevated creatinine. Think likely gastroenteritis. Will give medication for nausea.   ____________________________________________   FINAL CLINICAL IMPRESSION(S) / ED DIAGNOSES  Final diagnoses:  Gastroenteritis     Note: This dictation was prepared with Dragon dictation. Any transcriptional errors that result from this process are unintentional     Nance Pear, MD 03/15/16 1806

## 2016-03-15 NOTE — ED Triage Notes (Signed)
Patient to ER for c/o N/V/D for last 12 hours.

## 2016-03-15 NOTE — ED Notes (Signed)
Pt states I feel better .  Pt drinking sprite now.  Family with pt.

## 2016-03-15 NOTE — ED Notes (Signed)
Pt reports abd pain with vomiting and diarrhea that began this morning.  Intermittent abd pain.  No back pain.  Denies urinary sx.  No vag bleeding .  Pt alert.  Family with pt.

## 2016-03-15 NOTE — Discharge Instructions (Signed)
Please seek medical attention for any high fevers, chest pain, shortness of breath, change in behavior, persistent vomiting, bloody stool or any other new or concerning symptoms.  

## 2016-05-17 ENCOUNTER — Telehealth: Payer: Self-pay | Admitting: Family Medicine

## 2016-05-18 ENCOUNTER — Ambulatory Visit (INDEPENDENT_AMBULATORY_CARE_PROVIDER_SITE_OTHER): Payer: Managed Care, Other (non HMO) | Admitting: Family

## 2016-05-18 ENCOUNTER — Encounter: Payer: Self-pay | Admitting: Family

## 2016-05-18 VITALS — BP 118/76 | HR 71 | Temp 98.0°F | Ht 67.0 in | Wt 160.2 lb

## 2016-05-18 DIAGNOSIS — J209 Acute bronchitis, unspecified: Secondary | ICD-10-CM | POA: Diagnosis not present

## 2016-05-18 MED ORDER — HYDROCOD POLST-CPM POLST ER 10-8 MG/5ML PO SUER: 5.0000 mL | Freq: Every evening | ORAL | 0 refills | Status: DC | PRN

## 2016-05-18 MED ORDER — PREDNISONE 10 MG PO TABS: ORAL_TABLET | ORAL | 0 refills | Status: DC

## 2016-05-18 MED ORDER — BENZONATATE 100 MG PO CAPS: 100.0000 mg | ORAL_CAPSULE | Freq: Two times a day (BID) | ORAL | 0 refills | Status: DC | PRN

## 2016-05-18 NOTE — Assessment & Plan Note (Signed)
Working diagnosis of viral URI x 10 days. No acute respiratory distress. Afebrile. Patient and I agreed upon conservative therapy at this time with symptom management, close observation, and delayed antibiotic treatment. Due to history of asthma and prior responses to prednisone, short  Prednisone course given as well.

## 2016-05-18 NOTE — Progress Notes (Signed)
Subjective:    Patient ID: Jackie Miller, female    DOB: [birth year], 36 y.o.   MRN: [id number]  CC: Jackie Miller is a 36 y.o. female who presents today for an acute visit.    HPI: CC: dry cough x 10 days, worse.  Cough worse at night. Endorses clear runny nose and chest tightness with cough. No CP, wheezing, sob, fever, body aches, sinus pain, Ha, vision changes.   Tyelonol cold and mucinex without relief. Inhaler doesn't offer relief.    h/o asthma      HISTORY:  Past Medical History:  Diagnosis Date  . Asthma   . Bursitis   . Chicken pox   . Hay fever   . Jaundice    as a baby  . PCOS (polycystic ovarian syndrome)    Past Surgical History:  Procedure Laterality Date  . EYE SURGERY Bilateral 2006   lasik's  . MYRINGOTOMY WITH TUBE PLACEMENT    . TONSILLECTOMY AND ADENOIDECTOMY     Family History  Problem Relation Age of Onset  . Colon cancer Mother 67  . Hypertension Father   . Breast cancer Maternal Grandmother   . Lung cancer Paternal Grandfather     Allergies: Amoxicillin and Bacitracin Current Outpatient Prescriptions on File Prior to Visit  Medication Sig Dispense Refill  . meloxicam (MOBIC) 15 MG tablet     . montelukast (SINGULAIR) 10 MG tablet     . PROAIR HFA 108 (90 Base) MCG/ACT inhaler as needed.     No current facility-administered medications on file prior to visit.     Social History  Substance Use Topics  . Smoking status: Never Smoker  . Smokeless tobacco: Never Used  . Alcohol use 0.0 - 0.6 oz/week    Review of Systems  Constitutional: Negative for chills and fever.  HENT: Positive for congestion.   Respiratory: Positive for cough and chest tightness. Negative for shortness of breath and wheezing.   Cardiovascular: Negative for chest pain and palpitations.  Gastrointestinal: Negative for nausea and vomiting.      Objective:    BP 118/76   Pulse 71   Temp 98 F (36.7 C) (Oral)   Ht 5\' 7"  (1.702 m)   Wt 160 lb 3.2 oz (72.7  kg)   SpO2 99%   BMI 25.09 kg/m    Physical Exam  Constitutional: She appears well-developed and well-nourished.  HENT:  Head: Normocephalic and atraumatic.  Right Ear: Hearing, tympanic membrane, external ear and ear canal normal. No drainage, swelling or tenderness. No foreign bodies. Tympanic membrane is not erythematous and not bulging. No middle ear effusion. No decreased hearing is noted.  Left Ear: Hearing, tympanic membrane, external ear and ear canal normal. No drainage, swelling or tenderness. No foreign bodies. Tympanic membrane is not erythematous and not bulging.  No middle ear effusion. No decreased hearing is noted.  Nose: Nose normal. No rhinorrhea. Right sinus exhibits no maxillary sinus tenderness and no frontal sinus tenderness. Left sinus exhibits no maxillary sinus tenderness and no frontal sinus tenderness.  Mouth/Throat: Uvula is midline, oropharynx is clear and moist and mucous membranes are normal. No oropharyngeal exudate, posterior oropharyngeal edema, posterior oropharyngeal erythema or tonsillar abscesses.  Eyes: Conjunctivae are normal.  Cardiovascular: Regular rhythm, normal heart sounds and normal pulses.   Pulmonary/Chest: Effort normal and breath sounds normal. She has no wheezes. She has no rhonchi. She has no rales.  Lymphadenopathy:       Head (right side): No  submental, no submandibular, no tonsillar, no preauricular, no posterior auricular and no occipital adenopathy present.       Head (left side): No submental, no submandibular, no tonsillar, no preauricular, no posterior auricular and no occipital adenopathy present.    She has no cervical adenopathy.  Neurological: She is alert.  Skin: Skin is warm and dry.  Psychiatric: She has a normal mood and affect. Her speech is normal and behavior is normal. Thought content normal.  Vitals reviewed.  Patient felt significantly better after albuterol treatment. Lung sounds clear and increased       Assessment & Plan:   Problem List Items Addressed This Visit      Respiratory   Acute bronchitis - Primary    Working diagnosis of viral URI x 10 days. No acute respiratory distress. Afebrile. Patient and I agreed upon conservative therapy at this time with symptom management, close observation, and delayed antibiotic treatment. Due to history of asthma and prior responses to prednisone, short  Prednisone course given as well.        Relevant Medications   chlorpheniramine-HYDROcodone (TUSSIONEX PENNKINETIC ER) 10-8 MG/5ML SUER   benzonatate (TESSALON) 100 MG capsule   predniSONE (DELTASONE) 10 MG tablet        I have discontinued Ms. Kreger's Norgestimate-Eth Estradiol (MONONESSA PO), SYRINGE-NEEDLE (DISP) 3 ML, cyanocobalamin, chlorpheniramine-HYDROcodone, predniSONE, doxycycline, and ondansetron. I am also having her maintain her meloxicam, montelukast, PROAIR HFA, metFORMIN, and bromocriptine.   Meds ordered this encounter  Medications  . metFORMIN (GLUCOPHAGE-XR) 500 MG 24 hr tablet    Sig: Take by mouth.  . bromocriptine (PARLODEL) 2.5 MG tablet    Sig: Take by mouth.    Return precautions given.   Risks, benefits, and alternatives of the medications and treatment plan prescribed today were discussed, and patient expressed understanding.   Education regarding symptom management and diagnosis given to patient on AVS.  Continue to follow with Coral Spikes, DO for routine health maintenance.   Jackie Miller and I agreed with plan.   Mable Paris, FNP

## 2016-05-18 NOTE — Patient Instructions (Signed)
Suspect viral  Prednisone taper  Tessalon as needed for DAY time cough  Tussionex as needed for NIGHT time cough  Keep using inhaler  Mucinex if congestion is thick  Let me know if not better   Acute Bronchitis, Adult Acute bronchitis is sudden (acute) swelling of the air tubes (bronchi) in the lungs. Acute bronchitis causes these tubes to fill with mucus, which can make it hard to breathe. It can also cause coughing or wheezing. In adults, acute bronchitis usually goes away within 2 weeks. A cough caused by bronchitis may last up to 3 weeks. Smoking, allergies, and asthma can make the condition worse. Repeated episodes of bronchitis may cause further lung problems, such as chronic obstructive pulmonary disease (COPD). What are the causes? This condition can be caused by germs and by substances that irritate the lungs, including:  Cold and flu viruses. This condition is most often caused by the same virus that causes a cold.  Bacteria.  Exposure to tobacco smoke, dust, fumes, and air pollution. What increases the risk? This condition is more likely to develop in people who:  Have close contact with someone with acute bronchitis.  Are exposed to lung irritants, such as tobacco smoke, dust, fumes, and vapors.  Have a weak immune system.  Have a respiratory condition such as asthma. What are the signs or symptoms? Symptoms of this condition include:  A cough.  Coughing up clear, yellow, or green mucus.  Wheezing.  Chest congestion.  Shortness of breath.  A fever.  Body aches.  Chills.  A sore throat. How is this diagnosed? This condition is usually diagnosed with a physical exam. During the exam, your health care provider may order tests, such as chest X-rays, to rule out other conditions. He or she may also:  Test a sample of your mucus for bacterial infection.  Check the level of oxygen in your blood. This is done to check for pneumonia.  Do a chest X-ray  or lung function testing to rule out pneumonia and other conditions.  Perform blood tests. Your health care provider will also ask about your symptoms and medical history. How is this treated? Most cases of acute bronchitis clear up over time without treatment. Your health care provider may recommend:  Drinking more fluids. Drinking more makes your mucus thinner, which may make it easier to breathe.  Taking a medicine for a fever or cough.  Taking an antibiotic medicine.  Using an inhaler to help improve shortness of breath and to control a cough.  Using a cool mist vaporizer or humidifier to make it easier to breathe. Follow these instructions at home: Medicines   Take over-the-counter and prescription medicines only as told by your health care provider.  If you were prescribed an antibiotic, take it as told by your health care provider. Do not stop taking the antibiotic even if you start to feel better. General instructions   Get plenty of rest.  Drink enough fluids to keep your urine clear or pale yellow.  Avoid smoking and secondhand smoke. Exposure to cigarette smoke or irritating chemicals will make bronchitis worse. If you smoke and you need help quitting, ask your health care provider. Quitting smoking will help your lungs heal faster.  Use an inhaler, cool mist vaporizer, or humidifier as told by your health care provider.  Keep all follow-up visits as told by your health care provider. This is important. How is this prevented? To lower your risk of getting this condition again:  Wash your hands often with soap and water. If soap and water are not available, use hand sanitizer.  Avoid contact with people who have cold symptoms.  Try not to touch your hands to your mouth, nose, or eyes.  Make sure to get the flu shot every year. Contact a health care provider if:  Your symptoms do not improve in 2 weeks of treatment. Get help right away if:  You cough up  blood.  You have chest pain.  You have severe shortness of breath.  You become dehydrated.  You faint or keep feeling like you are going to faint.  You keep vomiting.  You have a severe headache.  Your fever or chills gets worse. This information is not intended to replace advice given to you by your health care provider. Make sure you discuss any questions you have with your health care provider. Document Released: 03/11/2004 Document Revised: 08/27/2015 Document Reviewed: 07/23/2015 Elsevier Interactive Patient Education  2017 [employer].

## 2016-09-20 ENCOUNTER — Ambulatory Visit
Admission: RE | Admit: 2016-09-20 | Payer: Managed Care, Other (non HMO) | Source: Ambulatory Visit | Admitting: Unknown Physician Specialty

## 2016-09-20 ENCOUNTER — Encounter: Admission: RE | Payer: Self-pay | Source: Ambulatory Visit

## 2016-09-20 SURGERY — COLONOSCOPY WITH PROPOFOL
Anesthesia: General

## 2016-09-22 ENCOUNTER — Encounter: Payer: Self-pay | Admitting: Family Medicine

## 2016-09-22 ENCOUNTER — Ambulatory Visit (INDEPENDENT_AMBULATORY_CARE_PROVIDER_SITE_OTHER): Payer: Managed Care, Other (non HMO) | Admitting: Family Medicine

## 2016-09-22 DIAGNOSIS — J209 Acute bronchitis, unspecified: Secondary | ICD-10-CM

## 2016-09-22 MED ORDER — HYDROCOD POLST-CPM POLST ER 10-8 MG/5ML PO SUER: 5.0000 mL | Freq: Two times a day (BID) | ORAL | 0 refills | Status: DC | PRN

## 2016-09-22 MED ORDER — PREDNISONE 50 MG PO TABS: ORAL_TABLET | ORAL | 0 refills | Status: DC

## 2016-09-22 MED ORDER — AZITHROMYCIN 250 MG PO TABS: ORAL_TABLET | ORAL | 0 refills | Status: DC

## 2016-09-22 MED ORDER — PROAIR HFA 108 (90 BASE) MCG/ACT IN AERS: 2.0000 | INHALATION_SPRAY | Freq: Four times a day (QID) | RESPIRATORY_TRACT | 1 refills | Status: DC | PRN

## 2016-09-22 NOTE — Progress Notes (Signed)
Subjective:  Patient ID: Jackie Miller, female    DOB: 04/15/1980  Age: 36 y.o. MRN: [id number]  CC: Cough, chest tightness  HPI:  36 year old female with a history of asthma and bronchitis presents with the above complaints.  Patient reports that she has been sick for the past 2 weeks. She's had severe cough and chest tightness. Worse at night. No associated fever. No chills. No shortness of breath. She is use over-the-counter Mucinex with no improvement. No no relieving factors. No other associated symptoms. No other complaints or concerns this time.  Social Hx   Social History   Social History  . Marital status: Single    Spouse name: N/A  . Number of children: N/A  . Years of education: N/A   Social History Main Topics  . Smoking status: Never Smoker  . Smokeless tobacco: Never Used  . Alcohol use 0.0 - 0.6 oz/week  . Drug use: No  . Sexual activity: No   Other Topics Concern  . None   Social History Narrative   Therapist, nutritional-    Engineering geologist at [employer]   Caffeine- 1 cup soda     Review of Systems  Constitutional: Negative.   Respiratory: Positive for cough and chest tightness.    Objective:  BP 106/74 (BP Location: Left Arm, Patient Position: Sitting, Cuff Size: Normal)   Pulse 63   Temp 98.1 F (36.7 C) (Oral)   Wt 157 lb (71.2 kg)   SpO2 99%   BMI 24.59 kg/m   BP/Weight 09/22/2016 05/18/2016 8/92/1194  Systolic BP 174 081 448  Diastolic BP 74 76 66  Wt. (Lbs) 157 160.2 155  BMI 24.59 25.09 24.28    Physical Exam  Constitutional: She is oriented to person, place, and time. She appears well-developed. No distress.  HENT:  Head: Normocephalic and atraumatic.  Mouth/Throat: Oropharynx is clear and moist.  Eyes: Conjunctivae are normal. Right eye exhibits no discharge. Left eye exhibits no discharge. No scleral icterus.  Cardiovascular: Normal rate and regular rhythm.   No murmur heard. Pulmonary/Chest: Effort normal and breath sounds  normal. She has no wheezes. She has no rales.  Neurological: She is alert and oriented to person, place, and time.  Psychiatric: She has a normal mood and affect.  Vitals reviewed.   Lab Results  Component Value Date   WBC 10.8 03/15/2016   HGB 14.0 03/15/2016   HCT 39.8 03/15/2016   PLT 199 03/15/2016   GLUCOSE 109 (H) 03/15/2016   ALT 13 (L) 03/15/2016   AST 20 03/15/2016   NA 137 03/15/2016   K 4.1 03/15/2016   CL 107 03/15/2016   CREATININE 0.73 03/15/2016   BUN 7 03/15/2016   CO2 23 03/15/2016   TSH 1.48 04/18/2015    Assessment & Plan:   Problem List Items Addressed This Visit    Acute bronchitis    Patient signs and symptoms consistent with acute bronchitis. Given duration of illness, treating with azithromycin, prednisone, Tussionex. Refilled albuterol.          Meds ordered this encounter  Medications  . azithromycin (ZITHROMAX) 250 MG tablet    Sig: 2 tablets on Day 1, then 1 tablet daily on days 2-5.    Dispense:  6 tablet    Refill:  0  . PROAIR HFA 108 (90 Base) MCG/ACT inhaler    Sig: Inhale 2 puffs into the lungs every 6 (six) hours as needed.    Dispense:  18 g    Refill:  1  . chlorpheniramine-HYDROcodone (TUSSIONEX PENNKINETIC ER) 10-8 MG/5ML SUER    Sig: Take 5 mLs by mouth every 12 (twelve) hours as needed.    Dispense:  115 mL    Refill:  0  . predniSONE (DELTASONE) 50 MG tablet    Sig: 1 tablet daily x 5 days.    Dispense:  5 tablet    Refill:  0    Follow-up: PRN  [place]

## 2016-09-22 NOTE — Patient Instructions (Signed)
Medications as prescribed.  Take care  Dr. Lacinda Axon    Acute Bronchitis, Adult Acute bronchitis is sudden (acute) swelling of the air tubes (bronchi) in the lungs. Acute bronchitis causes these tubes to fill with mucus, which can make it hard to breathe. It can also cause coughing or wheezing. In adults, acute bronchitis usually goes away within 2 weeks. A cough caused by bronchitis may last up to 3 weeks. Smoking, allergies, and asthma can make the condition worse. Repeated episodes of bronchitis may cause further lung problems, such as chronic obstructive pulmonary disease (COPD). What are the causes? This condition can be caused by germs and by substances that irritate the lungs, including:  Cold and flu viruses. This condition is most often caused by the same virus that causes a cold.  Bacteria.  Exposure to tobacco smoke, dust, fumes, and air pollution.  What increases the risk? This condition is more likely to develop in people who:  Have close contact with someone with acute bronchitis.  Are exposed to lung irritants, such as tobacco smoke, dust, fumes, and vapors.  Have a weak immune system.  Have a respiratory condition such as asthma.  What are the signs or symptoms? Symptoms of this condition include:  A cough.  Coughing up clear, yellow, or green mucus.  Wheezing.  Chest congestion.  Shortness of breath.  A fever.  Body aches.  Chills.  A sore throat.  How is this diagnosed? This condition is usually diagnosed with a physical exam. During the exam, your health care provider may order tests, such as chest X-rays, to rule out other conditions. He or she may also:  Test a sample of your mucus for bacterial infection.  Check the level of oxygen in your blood. This is done to check for pneumonia.  Do a chest X-ray or lung function testing to rule out pneumonia and other conditions.  Perform blood tests.  Your health care provider will also ask about  your symptoms and medical history. How is this treated? Most cases of acute bronchitis clear up over time without treatment. Your health care provider may recommend:  Drinking more fluids. Drinking more makes your mucus thinner, which may make it easier to breathe.  Taking a medicine for a fever or cough.  Taking an antibiotic medicine.  Using an inhaler to help improve shortness of breath and to control a cough.  Using a cool mist vaporizer or humidifier to make it easier to breathe.  Follow these instructions at home: Medicines  Take over-the-counter and prescription medicines only as told by your health care provider.  If you were prescribed an antibiotic, take it as told by your health care provider. Do not stop taking the antibiotic even if you start to feel better. General instructions  Get plenty of rest.  Drink enough fluids to keep your urine clear or pale yellow.  Avoid smoking and secondhand smoke. Exposure to cigarette smoke or irritating chemicals will make bronchitis worse. If you smoke and you need help quitting, ask your health care provider. Quitting smoking will help your lungs heal faster.  Use an inhaler, cool mist vaporizer, or humidifier as told by your health care provider.  Keep all follow-up visits as told by your health care provider. This is important. How is this prevented? To lower your risk of getting this condition again:  Wash your hands often with soap and water. If soap and water are not available, use hand sanitizer.  Avoid contact with people who  have cold symptoms.  Try not to touch your hands to your mouth, nose, or eyes.  Make sure to get the flu shot every year.  Contact a health care provider if:  Your symptoms do not improve in 2 weeks of treatment. Get help right away if:  You cough up blood.  You have chest pain.  You have severe shortness of breath.  You become dehydrated.  You faint or keep feeling like you are going  to faint.  You keep vomiting.  You have a severe headache.  Your fever or chills gets worse. This information is not intended to replace advice given to you by your health care provider. Make sure you discuss any questions you have with your health care provider. Document Released: 03/11/2004 Document Revised: 08/27/2015 Document Reviewed: 07/23/2015 Elsevier Interactive Patient Education  2017 [employer].

## 2016-09-22 NOTE — Assessment & Plan Note (Signed)
Patient signs and symptoms consistent with acute bronchitis. Given duration of illness, treating with azithromycin, prednisone, Tussionex. Refilled albuterol.

## 2016-11-15 NOTE — Telephone Encounter (Signed)
error 

## 2016-11-18 ENCOUNTER — Ambulatory Visit (INDEPENDENT_AMBULATORY_CARE_PROVIDER_SITE_OTHER): Payer: 59 | Admitting: Obstetrics and Gynecology

## 2016-11-18 ENCOUNTER — Other Ambulatory Visit: Payer: Self-pay | Admitting: Obstetrics and Gynecology

## 2016-11-18 ENCOUNTER — Encounter: Payer: Self-pay | Admitting: Obstetrics and Gynecology

## 2016-11-18 VITALS — Ht 67.0 in | Wt 155.0 lb

## 2016-11-18 DIAGNOSIS — Z01411 Encounter for gynecological examination (general) (routine) with abnormal findings: Secondary | ICD-10-CM

## 2016-11-18 DIAGNOSIS — Z803 Family history of malignant neoplasm of breast: Secondary | ICD-10-CM | POA: Diagnosis not present

## 2016-11-18 DIAGNOSIS — Z800 Family history of malignant neoplasm of digestive organs: Secondary | ICD-10-CM | POA: Diagnosis not present

## 2016-11-18 DIAGNOSIS — N926 Irregular menstruation, unspecified: Secondary | ICD-10-CM

## 2016-11-18 DIAGNOSIS — E282 Polycystic ovarian syndrome: Secondary | ICD-10-CM | POA: Diagnosis not present

## 2016-11-18 MED ORDER — DHEA 25 MG PO TABS: 25.0000 mg | ORAL_TABLET | Freq: Every day | ORAL | 4 refills | Status: DC

## 2016-11-18 MED ORDER — PROGESTERONE MICRONIZED 200 MG PO CAPS: 200.0000 mg | ORAL_CAPSULE | Freq: Every day | ORAL | 4 refills | Status: DC

## 2016-11-18 NOTE — Progress Notes (Addendum)
Subjective:   Jackie Miller is a 36 y.o. G0P0000 Caucasian female here for a routine well-woman exam.  Patient's last menstrual period was 11/16/2016.    Current complaints: none PCP: Lacinda Axon       does desire labs  Social History: Sexual: heterosexual Marital Status: single Living situation: alone Occupation: Theme park manager at [employer] Tobacco/alcohol: no tobacco use Illicit drugs: no history of illicit drug use  The following portions of the patient's history were reviewed and updated as appropriate: allergies, current medications, past family history, past medical history, past social history, past surgical history and problem list.  Past Medical History Past Medical History:  Diagnosis Date  . Asthma   . Asthma   . Bursitis   . Chicken pox   . Hay fever   . Jaundice    as a baby  . PCOS (polycystic ovarian syndrome)   . PCOS (polycystic ovarian syndrome)     Past Surgical History Past Surgical History:  Procedure Laterality Date  . EYE SURGERY Bilateral 2006   lasik's  . MYRINGOTOMY WITH TUBE PLACEMENT    . TONSILLECTOMY AND ADENOIDECTOMY      Gynecologic History G0P0000  Patient's last menstrual period was 11/16/2016. Contraception: abstinence Last Pap: 2015. Results were: normal   Obstetric History OB History  Gravida Para Term Preterm AB Living  0 0 0 0 0 0  SAB TAB Ectopic Multiple Live Births  0 0 0 0 0        Current Medications Current Outpatient Prescriptions on File Prior to Visit  Medication Sig Dispense Refill  . bromocriptine (PARLODEL) 2.5 MG tablet Take by mouth.    . meloxicam (MOBIC) 15 MG tablet     . montelukast (SINGULAIR) 10 MG tablet     . PROAIR HFA 108 (90 Base) MCG/ACT inhaler Inhale 2 puffs into the lungs every 6 (six) hours as needed. 18 g 1  . predniSONE (DELTASONE) 50 MG tablet 1 tablet daily x 5 days. (Patient not taking: Reported on 11/18/2016) 5 tablet 0   No current facility-administered medications on file prior to  visit.     Review of Systems Patient denies any headaches, blurred vision, shortness of breath, chest pain, abdominal pain, problems with bowel movements, urination, or intercourse. Irregular menses due to PCOS- can't tolerate BCP and metformin makes her sick.  Objective:  Ht 5\' 7"  (1.702 m)   Wt 155 lb (70.3 kg)   LMP 11/16/2016   BMI 24.28 kg/m  Physical Exam  General:  Well developed, well nourished, no acute distress. She is alert and oriented x3. Skin:  Warm and dry Neck:  Midline trachea, no thyromegaly or nodules Cardiovascular: Regular rate and rhythm, no murmur heard Lungs:  Effort normal, all lung fields clear to auscultation bilaterally Breasts:  No dominant palpable mass, retraction, or nipple discharge Abdomen:  Soft, non tender, no hepatosplenomegaly or masses Pelvic:  External genitalia is normal in appearance.  The vagina is normal in appearance. The cervix is bulbous, no CMT.  Thin prep pap is done with HR HPV cotesting. Uterus is felt to be normal size, shape, and contour.  No adnexal masses or tenderness noted. Extremities:  No swelling or varicosities noted Psych:  She has a normal mood and affect  Assessment:   Healthy well-woman exam PCOS with irregular menses Family history of breast cancer in MGM and Maternal cousin Family history of colon cancer in mother  Plan:  Labs obtained, will follow up accordingly. Genetic screening  desired- lab obtained today. Will restart progesterone at least every 3rd month if no meses. To add DHEA-s daily too. Baseline MMG orfdered F/U 1 year for AE, or sooner if needed Colonoscopy due next year  Neeya Prigmore [place], [place]

## 2016-11-18 NOTE — Addendum Note (Signed)
Addended by: Joylene Igo on: 11/18/2016 11:53 AM   Modules accepted: Orders

## 2016-11-18 NOTE — Patient Instructions (Addendum)
Preventive Care 18-39 Years, Female Preventive care refers to lifestyle choices and visits with your health care provider that can promote health and wellness. What does preventive care include?  A yearly physical exam. This is also called an annual well check.  Dental exams once or twice a year.  Routine eye exams. Ask your health care provider how often you should have your eyes checked.  Personal lifestyle choices, including: ? Daily care of your teeth and gums. ? Regular physical activity. ? Eating a healthy diet. ? Avoiding tobacco and drug use. ? Limiting alcohol use. ? Practicing safe sex. ? Taking vitamin and mineral supplements as recommended by your health care provider. What happens during an annual well check? The services and screenings done by your health care provider during your annual well check will depend on your age, overall health, lifestyle risk factors, and family history of disease. Counseling Your health care provider may ask you questions about your:  Alcohol use.  Tobacco use.  Drug use.  Emotional well-being.  Home and relationship well-being.  Sexual activity.  Eating habits.  Work and work Statistician.  Method of birth control.  Menstrual cycle.  Pregnancy history.  Screening You may have the following tests or measurements:  Height, weight, and BMI.  Diabetes screening. This is done by checking your blood sugar (glucose) after you have not eaten for a while (fasting).  Blood pressure.  Lipid and cholesterol levels. These may be checked every 5 years starting at age 38.  Skin check.  Hepatitis C blood test.  Hepatitis B blood test.  Sexually transmitted disease (STD) testing.  BRCA-related cancer screening. This may be done if you have a family history of breast, ovarian, tubal, or peritoneal cancers.  Pelvic exam and Pap test. This may be done every 3 years starting at age 38. Starting at age 30, this may be done  every 5 years if you have a Pap test in combination with an HPV test.  Discuss your test results, treatment options, and if necessary, the need for more tests with your health care provider. Vaccines Your health care provider may recommend certain vaccines, such as:  Influenza vaccine. This is recommended every year.  Tetanus, diphtheria, and acellular pertussis (Tdap, Td) vaccine. You may need a Td booster every 10 years.  Varicella vaccine. You may need this if you have not been vaccinated.  HPV vaccine. If you are 39 or younger, you may need three doses over 6 months.  Measles, mumps, and rubella (MMR) vaccine. You may need at least one dose of MMR. You may also need a second dose.  Pneumococcal 13-valent conjugate (PCV13) vaccine. You may need this if you have certain conditions and were not previously vaccinated.  Pneumococcal polysaccharide (PPSV23) vaccine. You may need one or two doses if you smoke cigarettes or if you have certain conditions.  Meningococcal vaccine. One dose is recommended if you are age 68-21 years and a first-year college student living in a residence hall, or if you have one of several medical conditions. You may also need additional booster doses.  Hepatitis A vaccine. You may need this if you have certain conditions or if you travel or work in places where you may be exposed to hepatitis A.  Hepatitis B vaccine. You may need this if you have certain conditions or if you travel or work in places where you may be exposed to hepatitis B.  Haemophilus influenzae type b (Hib) vaccine. You may need this  if you have certain risk factors.  Talk to your health care provider about which screenings and vaccines you need and how often you need them. This information is not intended to replace advice given to you by your health care provider. Make sure you discuss any questions you have with your health care provider. Document Released: 03/30/2001 Document Revised:  10/22/2015 Document Reviewed: 12/03/2014 Elsevier Interactive Patient Education  2017 [place] you for enrolling in [facility]. Please follow the instructions below to securely access your online medical record. MyChart allows you to send messages to your doctor, view your test results, renew your prescriptions, schedule appointments, and more.  How Do I Sign Up? 1. In your Internet browser, go to [email or web address]. 2. Click on the New  User? link in the Sign In box.  3. Enter your MyChart Access Code exactly as it appears below. You will not need to use this code after you have completed the sign-up process. If you do not sign up before the expiration date, you must request a new code. MyChart Access Code: [email or web address] Expires: 01/02/2017 10:57 AM  4. Enter the last four digits of your Social Security Number (xxxx) and Date of Birth (mm/dd/yyyy) as indicated and click Next. You will be taken to the next sign-up page. 5. Create a MyChart ID. This will be your MyChart login ID and cannot be changed, so think of one that is secure and easy to remember. 6. Create a MyChart password. You can change your password at any time. 7. Enter your Password Reset Question and Answer and click Next. This can be used at a later time if you forget your password.  8. Select your communication preference, and if applicable enter your e-mail address. You will receive e-mail notification when new information is available in MyChart by choosing to receive e-mail notifications and filling in your e-mail. 9. Click Sign In. You can now view your medical record.   Additional Information If you have questions, you can email [email or web address] or call [phone] to talk to our [place] staff. Remember, MyChart is NOT to be used for urgent needs. For medical emergencies, dial 911.

## 2016-11-19 LAB — COMPREHENSIVE METABOLIC PANEL
ALK PHOS: 46 IU/L (ref 39–117)
ALT: 8 IU/L (ref 0–32)
AST: 10 IU/L (ref 0–40)
Albumin/Globulin Ratio: 2.5 — ABNORMAL HIGH (ref 1.2–2.2)
Albumin: 4.5 g/dL (ref 3.5–5.5)
BUN/Creatinine Ratio: 9 (ref 9–23)
BUN: 7 mg/dL (ref 6–20)
Bilirubin Total: 1 mg/dL (ref 0.0–1.2)
CHLORIDE: 106 mmol/L (ref 96–106)
CO2: 21 mmol/L (ref 20–29)
Calcium: 9.2 mg/dL (ref 8.7–10.2)
Creatinine, Ser: 0.77 mg/dL (ref 0.57–1.00)
GFR calc Af Amer: 116 mL/min/{1.73_m2} (ref >59)
GFR calc non Af Amer: 100 mL/min/{1.73_m2} (ref >59)
GLUCOSE: 83 mg/dL (ref 65–99)
Globulin, Total: 1.8 g/dL (ref 1.5–4.5)
Potassium: 4.6 mmol/L (ref 3.5–5.2)
Sodium: 142 mmol/L (ref 134–144)
Total Protein: 6.3 g/dL (ref 6.0–8.5)

## 2016-11-19 LAB — ESTRADIOL: Estradiol: 31.4 pg/mL

## 2016-11-19 LAB — CBC
Hematocrit: 39.2 % (ref 34.0–46.6)
Hemoglobin: 13.3 g/dL (ref 11.1–15.9)
MCH: 29.1 pg (ref 26.6–33.0)
MCHC: 33.9 g/dL (ref 31.5–35.7)
MCV: 86 fL (ref 79–97)
PLATELETS: 321 10*3/uL (ref 150–379)
RBC: 4.57 x10E6/uL (ref 3.77–5.28)
RDW: 13.2 % (ref 12.3–15.4)
WBC: 6 10*3/uL (ref 3.4–10.8)

## 2016-11-19 LAB — TESTOSTERONE, FREE, TOTAL, SHBG
Sex Hormone Binding: 47.9 nmol/L (ref 24.6–122.0)
Testosterone, Free: 2.9 pg/mL (ref 0.0–4.2)
Testosterone: 45 ng/dL (ref 8–48)

## 2016-11-19 LAB — LIPID PANEL
CHOL/HDL RATIO: 2.3 ratio (ref 0.0–4.4)
Cholesterol, Total: 147 mg/dL (ref 100–199)
HDL: 65 mg/dL (ref >39)
LDL Calculated: 71 mg/dL (ref 0–99)
Triglycerides: 53 mg/dL (ref 0–149)
VLDL CHOLESTEROL CAL: 11 mg/dL (ref 5–40)

## 2016-11-19 LAB — THYROID PANEL WITH TSH
FREE THYROXINE INDEX: 1.7 (ref 1.2–4.9)
T3 Uptake Ratio: 26 % (ref 24–39)
T4 TOTAL: 6.7 ug/dL (ref 4.5–12.0)
TSH: 2.77 u[IU]/mL (ref 0.450–4.500)

## 2016-11-19 LAB — HEMOGLOBIN A1C
ESTIMATED AVERAGE GLUCOSE: 91 mg/dL
Hgb A1c MFr Bld: 4.8 % (ref 4.8–5.6)

## 2016-11-19 LAB — DHEA-SULFATE: DHEA SO4: 120.1 ug/dL (ref 57.3–279.2)

## 2016-11-19 LAB — PROGESTERONE: Progesterone: 0.4 ng/mL

## 2016-11-19 LAB — FERRITIN: Ferritin: 72 ng/mL (ref 15–150)

## 2016-11-19 LAB — FOLLICLE STIMULATING HORMONE: FSH: 5.4 m[IU]/mL

## 2016-11-22 ENCOUNTER — Telehealth: Payer: Self-pay | Admitting: *Deleted

## 2016-11-22 LAB — CYTOLOGY - PAP

## 2016-11-22 NOTE — Telephone Encounter (Signed)
-----   Message from Joylene Igo, [place] sent at 11/19/2016  3:17 PM EDT ----- Please let her know all labs are normal

## 2016-11-22 NOTE — Telephone Encounter (Signed)
Mailed info to pt 

## 2017-11-24 ENCOUNTER — Encounter: Payer: 59 | Admitting: Obstetrics and Gynecology

## 2018-01-24 ENCOUNTER — Encounter: Payer: 59 | Admitting: Obstetrics and Gynecology

## 2018-03-30 ENCOUNTER — Encounter: Payer: 59 | Admitting: Obstetrics and Gynecology

## 2018-05-18 ENCOUNTER — Encounter: Payer: Self-pay | Admitting: Obstetrics and Gynecology

## 2018-06-02 ENCOUNTER — Encounter: Payer: Self-pay | Admitting: Obstetrics and Gynecology

## 2018-08-01 ENCOUNTER — Encounter: Payer: Self-pay | Admitting: Obstetrics and Gynecology

## 2018-08-04 ENCOUNTER — Encounter: Payer: Self-pay | Admitting: Obstetrics and Gynecology

## 2018-08-23 ENCOUNTER — Telehealth: Payer: Self-pay

## 2018-08-23 NOTE — Telephone Encounter (Signed)
Coronavirus (COVID-19) Are you at risk?  Are you at risk for the Coronavirus (COVID-19)?  To be considered HIGH RISK for Coronavirus (COVID-19), you have to meet the following criteria:  . Traveled to [place], [place], [place], [place] or [place]; or in the [place] to [place], [place], [place], or [place]; and have fever, cough, and shortness of breath within the last 2 weeks of travel OR . Been in close contact with a person diagnosed with COVID-19 within the last 2 weeks and have fever, cough, and shortness of breath . IF YOU DO NOT MEET THESE CRITERIA, YOU ARE CONSIDERED LOW RISK FOR COVID-19.  What to do if you are HIGH RISK for COVID-19?  Marland Kitchen If you are having a medical emergency, call 911. . Seek medical care right away. Before you go to a doctor's office, urgent care or emergency department, call ahead and tell them about your recent travel, contact with someone diagnosed with COVID-19, and your symptoms. You should receive instructions from your physician's office regarding next steps of care.  . When you arrive at healthcare provider, tell the healthcare staff immediately you have returned from visiting [place], [place], [place], [place] or [place]; or traveled in the [place] to [place], [place], [place], or [place]; in the last two weeks or you have been in close contact with a person diagnosed with COVID-19 in the last 2 weeks.   . Tell the health care staff about your symptoms: fever, cough and shortness of breath. . After you have been seen by a medical provider, you will be either: o Tested for (COVID-19) and discharged home on quarantine except to seek medical care if symptoms worsen, and asked to  - Stay home and avoid contact with others until you get your results (4-5 days)  - Avoid travel on public transportation if possible (such as bus, train, or airplane) or o Sent to the Emergency Department by EMS for evaluation, COVID-19 testing, and possible  admission depending on your condition and test results.  What to do if you are LOW RISK for COVID-19?  Reduce your risk of any infection by using the same precautions used for avoiding the common cold or flu:  Marland Kitchen Wash your hands often with soap and warm water for at least 20 seconds.  If soap and water are not readily available, use an alcohol-based hand sanitizer with at least 60% alcohol.  . If coughing or sneezing, cover your mouth and nose by coughing or sneezing into the elbow areas of your shirt or coat, into a tissue or into your sleeve (not your hands). . Avoid shaking hands with others and consider head nods or verbal greetings only. . Avoid touching your eyes, nose, or mouth with unwashed hands.  . Avoid close contact with people who are Jackie Miller. . Avoid places or events with large numbers of people in one location, like concerts or sporting events. . Carefully consider travel plans you have or are making. . If you are planning any travel outside or inside the [place], visit the CDC's Travelers' Health webpage for the latest health notices. . If you have some symptoms but not all symptoms, continue to monitor at home and seek medical attention if your symptoms worsen. . If you are having a medical emergency, call 911.  08/23/18 SCREENING NEG SLS ADDITIONAL HEALTHCARE OPTIONS FOR PATIENTS  [facility] / e-Visit: [email or web address]         [facility]: [phone]  [facility]: [phone]                   [facility]: [phone]  

## 2018-08-24 ENCOUNTER — Encounter: Payer: Self-pay | Admitting: Obstetrics and Gynecology

## 2018-08-24 ENCOUNTER — Other Ambulatory Visit: Payer: Self-pay

## 2018-08-24 ENCOUNTER — Ambulatory Visit (INDEPENDENT_AMBULATORY_CARE_PROVIDER_SITE_OTHER): Payer: 59 | Admitting: Obstetrics and Gynecology

## 2018-08-24 VITALS — BP 130/77 | HR 92 | Ht 66.0 in | Wt 164.1 lb

## 2018-08-24 DIAGNOSIS — E282 Polycystic ovarian syndrome: Secondary | ICD-10-CM | POA: Diagnosis not present

## 2018-08-24 DIAGNOSIS — Z803 Family history of malignant neoplasm of breast: Secondary | ICD-10-CM

## 2018-08-24 DIAGNOSIS — Z01411 Encounter for gynecological examination (general) (routine) with abnormal findings: Secondary | ICD-10-CM

## 2018-08-24 DIAGNOSIS — E229 Hyperfunction of pituitary gland, unspecified: Secondary | ICD-10-CM | POA: Diagnosis not present

## 2018-08-24 DIAGNOSIS — B379 Candidiasis, unspecified: Secondary | ICD-10-CM

## 2018-08-24 DIAGNOSIS — R7989 Other specified abnormal findings of blood chemistry: Secondary | ICD-10-CM

## 2018-08-24 MED ORDER — TERCONAZOLE 0.4 % VA CREA: 1.0000 | TOPICAL_CREAM | Freq: Every day | VAGINAL | 0 refills | Status: DC

## 2018-08-24 MED ORDER — MEDROXYPROGESTERONE ACETATE 10 MG PO TABS: 10.0000 mg | ORAL_TABLET | Freq: Every day | ORAL | 2 refills | Status: DC

## 2018-08-24 NOTE — Patient Instructions (Signed)
 Preventive Care 21-37 Years Old, Female Preventive care refers to visits with your health care provider and lifestyle choices that can promote health and wellness. This includes:  A yearly physical exam. This may also be called an annual well check.  Regular dental visits and eye exams.  Immunizations.  Screening for certain conditions.  Healthy lifestyle choices, such as eating a healthy diet, getting regular exercise, not using drugs or products that contain nicotine and tobacco, and limiting alcohol use. What can I expect for my preventive care visit? Physical exam Your health care provider will check your:  Height and weight. This may be used to calculate body mass index (BMI), which tells if you are at a healthy weight.  Heart rate and blood pressure.  Skin for abnormal spots. Counseling Your health care provider may ask you questions about your:  Alcohol, tobacco, and drug use.  Emotional well-being.  Home and relationship well-being.  Sexual activity.  Eating habits.  Work and work environment.  Method of birth control.  Menstrual cycle.  Pregnancy history. What immunizations do I need?  Influenza (flu) vaccine  This is recommended every year. Tetanus, diphtheria, and pertussis (Tdap) vaccine  You may need a Td booster every 10 years. Varicella (chickenpox) vaccine  You may need this if you have not been vaccinated. Human papillomavirus (HPV) vaccine  If recommended by your health care provider, you may need three doses over 6 months. Measles, mumps, and rubella (MMR) vaccine  You may need at least one dose of MMR. You may also need a second dose. Meningococcal conjugate (MenACWY) vaccine  One dose is recommended if you are age 19-21 years and a first-year college student living in a residence hall, or if you have one of several medical conditions. You may also need additional booster doses. Pneumococcal conjugate (PCV13) vaccine  You may need  this if you have certain conditions and were not previously vaccinated. Pneumococcal polysaccharide (PPSV23) vaccine  You may need one or two doses if you smoke cigarettes or if you have certain conditions. Hepatitis A vaccine  You may need this if you have certain conditions or if you travel or work in places where you may be exposed to hepatitis A. Hepatitis B vaccine  You may need this if you have certain conditions or if you travel or work in places where you may be exposed to hepatitis B. Haemophilus influenzae type b (Hib) vaccine  You may need this if you have certain conditions. You may receive vaccines as individual doses or as more than one vaccine together in one shot (combination vaccines). Talk with your health care provider about the risks and benefits of combination vaccines. What tests do I need?  Blood tests  Lipid and cholesterol levels. These may be checked every 5 years starting at age 20.  Hepatitis C test.  Hepatitis B test. Screening  Diabetes screening. This is done by checking your blood sugar (glucose) after you have not eaten for a while (fasting).  Sexually transmitted disease (STD) testing.  BRCA-related cancer screening. This may be done if you have a family history of breast, ovarian, tubal, or peritoneal cancers.  Pelvic exam and Pap test. This may be done every 3 years starting at age 21. Starting at age 30, this may be done every 5 years if you have a Pap test in combination with an HPV test. Talk with your health care provider about your test results, treatment options, and if necessary, the need for more   tests. Follow these instructions at home: Eating and drinking   Eat a diet that includes fresh fruits and vegetables, whole grains, lean protein, and low-fat dairy.  Take vitamin and mineral supplements as recommended by your health care provider.  Do not drink alcohol if: ? Your health care provider tells you not to drink. ? You are  pregnant, may be pregnant, or are planning to become pregnant.  If you drink alcohol: ? Limit how much you have to 0-1 drink a day. ? Be aware of how much alcohol is in your drink. In the [place] one drink equals one 12 oz bottle of beer (355 mL), one 5 oz glass of wine (148 mL), or one 1 oz glass of hard liquor (44 mL). Lifestyle  Take daily care of your teeth and gums.  Stay active. Exercise for at least 30 minutes on 5 or more days each week.  Do not use any products that contain nicotine or tobacco, such as cigarettes, e-cigarettes, and chewing tobacco. If you need help quitting, ask your health care provider.  If you are sexually active, practice safe sex. Use a condom or other form of birth control (contraception) in order to prevent pregnancy and STIs (sexually transmitted infections). If you plan to become pregnant, see your health care provider for a preconception visit. What's next?  Visit your health care provider once a year for a well check visit.  Ask your health care provider how often you should have your eyes and teeth checked.  Stay up to date on all vaccines. This information is not intended to replace advice given to you by your health care provider. Make sure you discuss any questions you have with your health care provider. Document Released: 03/30/2001 Document Revised: 10/13/2017 Document Reviewed: 10/13/2017 Elsevier Patient Education  2020 [employer].

## 2018-08-24 NOTE — Progress Notes (Signed)
Subjective:   Jackie Miller is a 38 y.o. G0P0000 Caucasian female here for a routine well-woman exam.  Patient's last menstrual period was 07/30/2018 (exact date).    Current complaints: unsure if PCOS dx accurate or if it is Endometriosis. Pt reports when she does have menses that intense abdominal pain 10/10 on day one with dark bleeding and clots. Pt denies having been taking the progesterone Rx if no menses every 3 months. Discussed the need for a menses every 3 months due to hyperplasia and increased risk associated with it. Pt verbalized understanding. Pt reports that when she does bleed it is only the left side of the tampon that is soaked and the right side remains dry.   External vaginal itching, intermittent for the past 2-3 months. From lower vaginal opening to rectum. Denies changes in soaps, detergents, or vaginal hygiene. Has used vaseline , but has not had relief from use. Denies vaginal odor or abnormal d/c.   PCP: none      Patient does not desire labs  Social History: Sexual: heterosexual Marital Status: single Living situation: alone Occupation: Theme park manager at [employer] Tobacco/alcohol: no tobacco use Illicit drugs: no history of illicit drug use  The following portions of the patient's history were reviewed and updated as appropriate: allergies, current medications, past family history, past medical history, past social history, past surgical history and problem list.  Past Medical History Past Medical History:  Diagnosis Date  . Asthma   . Asthma   . Bursitis   . Chicken pox   . Hay fever   . Jaundice    as a baby  . PCOS (polycystic ovarian syndrome)   . PCOS (polycystic ovarian syndrome)     Past Surgical History Past Surgical History:  Procedure Laterality Date  . EYE SURGERY Bilateral 2006   lasik's  . MYRINGOTOMY WITH TUBE PLACEMENT    . TONSILLECTOMY AND ADENOIDECTOMY      Gynecologic History G0P0000  Patient's last menstrual period was  07/30/2018 (exact date). Contraception: abstinence Last Pap: 11/2016. Results were: normal   Obstetric History OB History  Gravida Para Term Preterm AB Living  0 0 0 0 0 0  SAB TAB Ectopic Multiple Live Births  0 0 0 0 0    Current Medications Current Outpatient Medications on File Prior to Visit  Medication Sig Dispense Refill  . meloxicam (MOBIC) 15 MG tablet     . PROAIR HFA 108 (90 Base) MCG/ACT inhaler Inhale 2 puffs into the lungs every 6 (six) hours as needed. 18 g 1  . bromocriptine (PARLODEL) 2.5 MG tablet Take by mouth.     No current facility-administered medications on file prior to visit.     Review of Systems Patient denies any headaches, blurred vision, shortness of breath, chest pain, abdominal pain, problems with bowel movements, urination, or intercourse.  Objective:  BP 130/77   Pulse 92   Ht 5\' 6"  (1.676 m)   Wt 164 lb 1 oz (74.4 kg)   LMP 07/30/2018 (Exact Date)   BMI 26.48 kg/m  Physical Exam  General:  Well developed, well nourished, no acute distress. She is alert and oriented x3. Skin:  Warm and dry Neck:  Midline trachea, no thyromegaly or nodules Cardiovascular: Regular rate and rhythm, no murmur heard Lungs:  Effort normal, all lung fields clear to auscultation bilaterally Breasts:  No dominant palpable mass, retraction, or nipple discharge Abdomen:  Soft, non tender, no hepatosplenomegaly or masses Pelvic:  External genitalia is normal in appearance except for raised red area between vaginal opening toward rectum. The vagina is normal in appearance. The cervix is bulbous, no CMT.  Thin prep pap is not done. Uterus is felt to be normal size, shape, and contour.  No adnexal masses or tenderness noted. Microscopic wet-mount exam shows hyphae. Extremities:  No swelling or varicosities noted Psych:  She has a normal mood and affect  Assessment:   Healthy well-woman exam PCOS Vaginal yeast infection Irregular menses Family hx of breast  cancer   Plan:  Abdominal US ordered  F/U 1 year for AE , or sooner if  Mammogram needs baseline, ordered, pt to call to schedule Fasting labs ordered for PCOS vs endometriosis differential dx Progesterone reordered to take q3 months if no menses Discussed need for mammogram baseline 2/2 heavy family dx of breast cancer.  Tercanazole cream ordered, discussed tx to apply vaginally nightly and put also on reddened itching area up to 7 days.    Silvestre Mesi, [place], CNM

## 2018-08-29 ENCOUNTER — Other Ambulatory Visit: Payer: Self-pay

## 2018-08-29 ENCOUNTER — Other Ambulatory Visit: Payer: 59

## 2018-08-29 ENCOUNTER — Ambulatory Visit (INDEPENDENT_AMBULATORY_CARE_PROVIDER_SITE_OTHER): Payer: 59

## 2018-08-29 DIAGNOSIS — E282 Polycystic ovarian syndrome: Secondary | ICD-10-CM

## 2018-08-29 DIAGNOSIS — R5382 Chronic fatigue, unspecified: Secondary | ICD-10-CM

## 2018-08-29 DIAGNOSIS — R7989 Other specified abnormal findings of blood chemistry: Secondary | ICD-10-CM

## 2018-08-30 LAB — COMPREHENSIVE METABOLIC PANEL
ALT: 8 IU/L (ref 0–32)
AST: 10 IU/L (ref 0–40)
Albumin/Globulin Ratio: 2.6 — ABNORMAL HIGH (ref 1.2–2.2)
Albumin: 4.5 g/dL (ref 3.8–4.8)
Alkaline Phosphatase: 45 IU/L (ref 39–117)
BUN/Creatinine Ratio: 8 — ABNORMAL LOW (ref 9–23)
BUN: 7 mg/dL (ref 6–20)
Bilirubin Total: 0.5 mg/dL (ref 0.0–1.2)
CO2: 20 mmol/L (ref 20–29)
Calcium: 9.3 mg/dL (ref 8.7–10.2)
Chloride: 107 mmol/L — ABNORMAL HIGH (ref 96–106)
Creatinine, Ser: 0.9 mg/dL (ref 0.57–1.00)
GFR calc Af Amer: 94 mL/min/{1.73_m2} (ref >59)
GFR calc non Af Amer: 82 mL/min/{1.73_m2} (ref >59)
Globulin, Total: 1.7 g/dL (ref 1.5–4.5)
Glucose: 80 mg/dL (ref 65–99)
Potassium: 4.6 mmol/L (ref 3.5–5.2)
Sodium: 142 mmol/L (ref 134–144)
Total Protein: 6.2 g/dL (ref 6.0–8.5)

## 2018-08-30 LAB — THYROID PANEL WITH TSH
Free Thyroxine Index: 1.7 (ref 1.2–4.9)
T3 Uptake Ratio: 25 % (ref 24–39)
T4, Total: 6.6 ug/dL (ref 4.5–12.0)
TSH: 2.28 u[IU]/mL (ref 0.450–4.500)

## 2018-08-30 LAB — PROGESTERONE: Progesterone: 8.5 ng/mL

## 2018-08-30 LAB — PROLACTIN: Prolactin: 31 ng/mL — ABNORMAL HIGH (ref 4.8–23.3)

## 2018-08-30 LAB — LIPID PANEL
Chol/HDL Ratio: 2.7 ratio (ref 0.0–4.4)
Cholesterol, Total: 151 mg/dL (ref 100–199)
HDL: 56 mg/dL (ref >39)
LDL Calculated: 86 mg/dL (ref 0–99)
Triglycerides: 47 mg/dL (ref 0–149)
VLDL Cholesterol Cal: 9 mg/dL (ref 5–40)

## 2018-08-30 LAB — CBC
Hematocrit: 40.2 % (ref 34.0–46.6)
Hemoglobin: 13.7 g/dL (ref 11.1–15.9)
MCH: 29.8 pg (ref 26.6–33.0)
MCHC: 34.1 g/dL (ref 31.5–35.7)
MCV: 87 fL (ref 79–97)
Platelets: 305 10*3/uL (ref 150–450)
RBC: 4.6 x10E6/uL (ref 3.77–5.28)
RDW: 12 % (ref 11.7–15.4)
WBC: 7.2 10*3/uL (ref 3.4–10.8)

## 2018-08-30 LAB — HEMOGLOBIN A1C
Est. average glucose Bld gHb Est-mCnc: 94 mg/dL
Hgb A1c MFr Bld: 4.9 % (ref 4.8–5.6)

## 2018-08-30 LAB — ESTRADIOL: Estradiol: 113.1 pg/mL

## 2018-08-30 LAB — DHEA-SULFATE: DHEA-SO4: 132.5 ug/dL (ref 57.3–279.2)

## 2018-08-30 LAB — TESTOSTERONE: Testosterone: 30 ng/dL (ref 8–48)

## 2018-08-30 LAB — INSULIN, RANDOM: INSULIN: 11 u[IU]/mL (ref 2.6–24.9)

## 2018-10-18 ENCOUNTER — Encounter: Payer: Self-pay | Admitting: Ophthalmology

## 2018-10-18 ENCOUNTER — Other Ambulatory Visit
Admission: RE | Admit: 2018-10-18 | Discharge: 2018-10-18 | Disposition: A | Discharge status: Home / Self Care | Payer: 59 | Source: Ambulatory Visit | Attending: Ophthalmology | Admitting: Ophthalmology

## 2018-10-18 ENCOUNTER — Other Ambulatory Visit: Payer: Self-pay

## 2018-10-18 DIAGNOSIS — Z20828 Contact with and (suspected) exposure to other viral communicable diseases: Secondary | ICD-10-CM | POA: Insufficient documentation

## 2018-10-18 DIAGNOSIS — Z01812 Encounter for preprocedural laboratory examination: Secondary | ICD-10-CM | POA: Diagnosis present

## 2018-10-18 LAB — SARS CORONAVIRUS 2 (TAT 6-24 HRS): SARS Coronavirus 2: NEGATIVE

## 2019-02-23 ENCOUNTER — Other Ambulatory Visit: Payer: Self-pay

## 2019-02-23 ENCOUNTER — Ambulatory Visit (INDEPENDENT_AMBULATORY_CARE_PROVIDER_SITE_OTHER): Payer: 59 | Admitting: Internal Medicine

## 2019-02-23 VITALS — Ht 67.0 in | Wt 164.0 lb

## 2019-02-23 DIAGNOSIS — Z800 Family history of malignant neoplasm of digestive organs: Secondary | ICD-10-CM | POA: Diagnosis not present

## 2019-02-23 DIAGNOSIS — Z1211 Encounter for screening for malignant neoplasm of colon: Secondary | ICD-10-CM

## 2019-02-23 DIAGNOSIS — J4520 Mild intermittent asthma, uncomplicated: Secondary | ICD-10-CM | POA: Diagnosis not present

## 2019-02-23 MED ORDER — PROAIR HFA 108 (90 BASE) MCG/ACT IN AERS: 2.0000 | INHALATION_SPRAY | Freq: Four times a day (QID) | RESPIRATORY_TRACT | 12 refills | Status: DC | PRN

## 2019-02-23 MED ORDER — MONTELUKAST SODIUM 10 MG PO TABS: 10.0000 mg | ORAL_TABLET | Freq: Every day | ORAL | 3 refills | Status: DC

## 2019-02-23 MED ORDER — FLUTICASONE-SALMETEROL 250-50 MCG/DOSE IN AEPB: 1.0000 | INHALATION_SPRAY | Freq: Two times a day (BID) | RESPIRATORY_TRACT | 12 refills | Status: DC

## 2019-02-23 NOTE — Progress Notes (Signed)
Virtual Visit via Video Note  I connected with Jackie Miller  on 02/23/19 at  4:20 PM EST by a video enabled telemedicine application and verified that I am speaking with the correct person using two identifiers.  Location patient: home Location provider:work or home office Persons participating in the virtual visit: patient, provider  I discussed the limitations of evaluation and management by telemedicine and the availability of in person appointments. The patient expressed understanding and agreed to proceed.   HPI: New patient  1. Asthma with h/o bronchitis. She normally gets bronchitis 7x/yr c/o increased cough chest tightness at times. Prev had been on Advair, pulmicort, singulair at 1 time and wants to resume and needs Rx proair  She has noticed increased sx's with wearing a mask daily at work  2. FH colon cancer mom died age 41 she is overdue for screening colonoscopy and wants to see Dr. Allen Norris  3. PCOS considering new ob/gyn reviewed labs 08/29/18 cmet, lipid, cbc, tsh per [facility] last seen   ROS: See pertinent positives and negatives per HPI. General: wt stable HEENT: normal hearing  CV: no chest pain  Lungs: no sob  GI: no ab pain  GU: PCOS+ MSK: no issues Skin: no issues currently  Neuro: no h/a Psych: mood normal    Past Medical History:  Diagnosis Date  . Asthma   . Asthma   . Bursitis   . Chicken pox   . FH: colon cancer    mom died age 59 yo  . Hay fever   . Jaundice    as a baby  . PCOS (polycystic ovarian syndrome)   . PCOS (polycystic ovarian syndrome)     Past Surgical History:  Procedure Laterality Date  . EYE SURGERY Bilateral 2006   lasik's  . MYRINGOTOMY WITH TUBE PLACEMENT    . TONSILLECTOMY AND ADENOIDECTOMY      Family History  Problem Relation Age of Onset  . Colon cancer Mother 91  . Hypertension Father   . Breast cancer Maternal Grandmother   . Lung cancer Paternal Grandfather   . 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  medroxyPROGESTERone (PROVERA) 10 MG tablet, Take 1 tablet (10 mg total) by mouth daily. Use for ten days, Disp: 10 tablet, Rfl: 2 .  meloxicam (MOBIC) 15 MG tablet, , Disp: , Rfl:  .  PROAIR HFA 108 (90 Base) MCG/ACT inhaler, Inhale 2 puffs into the lungs every 6 (six) hours as needed., Disp: 18 g, Rfl: 12 .  terconazole (TERAZOL 7) 0.4 % vaginal cream, Place 1 applicator vaginally at bedtime., Disp: 45 g, Rfl: 0 .  bromocriptine (PARLODEL) 2.5 MG tablet, Take by mouth., Disp: , Rfl:  .  Fluticasone-Salmeterol (ADVAIR DISKUS) 250-50 MCG/DOSE AEPB, Inhale 1 puff into the lungs 2 (two) times daily. Rinse mouth, Disp: 60 each, Rfl: 12 .  montelukast (SINGULAIR) 10 MG tablet, Take 1 tablet (10 mg total) by mouth at bedtime., Disp: 90 tablet, Rfl: 3  EXAM:  VITALS per patient if applicable:  GENERAL: alert, oriented, appears well and in no acute distress  HEENT: atraumatic, conjunttiva clear, no obvious abnormalities on inspection of external nose and ears  NECK: normal movements of the  head and neck  LUNGS: on inspection no signs of respiratory distress, breathing rate appears normal, no obvious gross SOB, gasping or wheezing  CV: no obvious cyanosis  MS: moves all visible extremities without noticeable abnormality  PSYCH/NEURO: pleasant and cooperative, no obvious depression or anxiety, speech and thought processing grossly intact  ASSESSMENT AND PLAN:  Discussed the following assessment and plan:  Mild intermittent asthmatic bronchitis without complication - Plan: Fluticasone-Salmeterol (ADVAIR DISKUS) 250-50 MCG/DOSE AEPB, montelukast (SINGULAIR) 10 MG tablet, PROAIR HFA 108 (90 Base) MCG/ACT inhaler  Consider asthma and allergy in future if not stable on  these meds  Encounter for screening colonoscopy - Plan: Ambulatory referral to Gastroenterology Family history of colon cancer requiring screening colonoscopy - Plan: Ambulatory referral to Gastroenterology Dr. Allen Norris  HM Declines flu shot  Declines covid 19 vaccine  Tdap utd Pap 11/18/16 neg neg HPV -will need new OB/GYN in future with h/o pcos  Consider derm in the future Dr. Kellie Moor let me know when ready for referral  GI-referred Dr. Allen Norris overdue screening colonoscopy FH mom died age 37 colon cancer former saw Dr. Tiffany Kocher who stated she had long colon and caution with colonoscopies per pt last colonoscopy per pt in 2014 [facility]   Consider HIV, UA and vitamin D in future with labs   -we discussed possible serious and likely etiologies, options for evaluation and workup, limitations of telemedicine visit vs in person visit, treatment, treatment risks and precautions. Pt prefers to treat via telemedicine empirically rather then risking or undertaking an in person visit at this moment. Patient agrees to seek prompt in person care if worsening, new symptoms arise, or if is not improving with treatment.   I discussed the assessment and treatment plan with the patient. The patient was provided an opportunity to ask questions and all were answered. The patient agreed with the plan and demonstrated an understanding of the instructions.   The patient was advised to call back or seek an in-person evaluation if the symptoms worsen or if the condition fails to improve as anticipated.  Time spent 30 minutes  Delorise Jackson, MD

## 2019-02-26 ENCOUNTER — Encounter: Payer: Self-pay | Admitting: Internal Medicine

## 2019-02-26 DIAGNOSIS — Z800 Family history of malignant neoplasm of digestive organs: Secondary | ICD-10-CM | POA: Insufficient documentation

## 2019-02-27 ENCOUNTER — Other Ambulatory Visit: Payer: Self-pay

## 2019-02-27 ENCOUNTER — Telehealth: Payer: Self-pay

## 2019-02-27 DIAGNOSIS — Z1211 Encounter for screening for malignant neoplasm of colon: Secondary | ICD-10-CM

## 2019-02-27 DIAGNOSIS — Z800 Family history of malignant neoplasm of digestive organs: Secondary | ICD-10-CM

## 2019-02-27 MED ORDER — NA SULFATE-K SULFATE-MG SULF 17.5-3.13-1.6 GM/177ML PO SOLN: 1.0000 | Freq: Once | ORAL | 0 refills | Status: AC

## 2019-02-27 NOTE — Telephone Encounter (Signed)
Gastroenterology Pre-Procedure Review  Request Date: Monday 03/26/19 Requesting Physician: Dr. Allen Norris  PATIENT REVIEW QUESTIONS: The patient responded to the following health history questions as indicated:    1. Are you having any GI issues? no 2. Do you have a personal history of Polyps? no 3. Do you have a family history of Colon Cancer or Polyps? yes (mother colon cancer age 38) 40. Diabetes Mellitus? no 5. Joint replacements in the past 12 months?no 6. Major health problems in the past 3 months?no 7. Any artificial heart valves, MVP, or defibrillator?no    MEDICATIONS & ALLERGIES:    Patient reports the following regarding taking any anticoagulation/antiplatelet therapy:   Plavix, Coumadin, Eliquis, Xarelto, Lovenox, Pradaxa, Brilinta, or Effient? no Aspirin? no  Patient confirms/reports the following medications:  Current Outpatient Medications  Medication Sig Dispense Refill  . bromocriptine (PARLODEL) 2.5 MG tablet Take by mouth.    . Fluticasone-Salmeterol (ADVAIR DISKUS) 250-50 MCG/DOSE AEPB Inhale 1 puff into the lungs 2 (two) times daily. Rinse mouth 60 each 12  . medroxyPROGESTERone (PROVERA) 10 MG tablet Take 1 tablet (10 mg total) by mouth daily. Use for ten days 10 tablet 2  . meloxicam (MOBIC) 15 MG tablet     . montelukast (SINGULAIR) 10 MG tablet Take 1 tablet (10 mg total) by mouth at bedtime. 90 tablet 3  . PROAIR HFA 108 (90 Base) MCG/ACT inhaler Inhale 2 puffs into the lungs every 6 (six) hours as needed. 18 g 12  . terconazole (TERAZOL 7) 0.4 % vaginal cream Place 1 applicator vaginally at bedtime. 45 g 0   No current facility-administered medications for this visit.    Patient confirms/reports the following allergies:  Allergies  Allergen Reactions  . Amoxicillin Swelling  . Bacitracin Itching  . Doxycycline Other (See Comments)    Muscloskeletal pain    No orders of the defined types were placed in this encounter.   AUTHORIZATION  INFORMATION Primary Insurance: 1D#: Group #:  Secondary Insurance: 1D#: Group #:  SCHEDULE INFORMATION: Date: 03/26/19 Time: Location:[facility]

## 2019-03-13 ENCOUNTER — Telehealth: Payer: Self-pay

## 2019-03-13 NOTE — Telephone Encounter (Signed)
Returned patients call in regards to rescheduling 03/26/19 colonoscopy.  LVM for her to call back to reschedule.  I also sent her a Pharmacist, community message.  Thanks [employer]

## 2019-03-14 ENCOUNTER — Telehealth: Payer: Self-pay

## 2019-03-14 NOTE — Telephone Encounter (Signed)
Patients call has been returned.  Her colonoscopy has been rescheduled from 02/08 with Vanga to 04/10/19 with Dr. Allen Norris still at [facility].  Patient advised of new COVID Test date 04/06/19.  Kim at [facility] has been informed of date and doctor change.  Referral updated. New instructions sent via mychart.  Thanks [place], [place]

## 2019-04-04 ENCOUNTER — Telehealth: Payer: Self-pay

## 2019-04-04 ENCOUNTER — Telehealth: Payer: Self-pay | Admitting: Gastroenterology

## 2019-04-04 ENCOUNTER — Encounter: Payer: Self-pay | Admitting: Gastroenterology

## 2019-04-04 ENCOUNTER — Other Ambulatory Visit: Payer: Self-pay

## 2019-04-04 MED ORDER — NA SULFATE-K SULFATE-MG SULF 17.5-3.13-1.6 GM/177ML PO SOLN: 1.0000 | Freq: Once | ORAL | 0 refills | Status: AC

## 2019-04-04 NOTE — Telephone Encounter (Signed)
Patient called asking for prep to be called into [facility] today for her to pick up. Please call her once completed.

## 2019-04-04 NOTE — Telephone Encounter (Signed)
Patient notified Manus Gunning has been sent to pharmacy.  Thanks,  [place], [place]

## 2019-04-04 NOTE — Telephone Encounter (Signed)
Patient has been advised of bowel prep change from [facility] to [place] due to insurance.  Pt has been advised to begin her bowel prep between 5pm and 6pm the evening before procedure.  Drink 8 oz every 30 minutes until she completes the entire contents.  Thanks,  [place], [place]

## 2019-04-06 ENCOUNTER — Other Ambulatory Visit: Payer: Self-pay

## 2019-04-06 ENCOUNTER — Other Ambulatory Visit
Admission: RE | Admit: 2019-04-06 | Discharge: 2019-04-06 | Disposition: A | Discharge status: Home / Self Care | Payer: 59 | Source: Ambulatory Visit | Attending: Gastroenterology | Admitting: Gastroenterology

## 2019-04-06 DIAGNOSIS — Z20822 Contact with and (suspected) exposure to covid-19: Secondary | ICD-10-CM | POA: Diagnosis not present

## 2019-04-06 DIAGNOSIS — Z01812 Encounter for preprocedural laboratory examination: Secondary | ICD-10-CM | POA: Insufficient documentation

## 2019-04-07 LAB — SARS CORONAVIRUS 2 (TAT 6-24 HRS): SARS Coronavirus 2: NEGATIVE

## 2019-04-09 NOTE — Discharge Instructions (Signed)
General Anesthesia, Adult, Care After This sheet gives you information about how to care for yourself after your procedure. Your health care provider may also give you more specific instructions. If you have problems or questions, contact your health care provider. What can I expect after the procedure? After the procedure, the following side effects are common:  Pain or discomfort at the IV site.  Nausea.  Vomiting.  Sore throat.  Trouble concentrating.  Feeling cold or chills.  Weak or tired.  Sleepiness and fatigue.  Soreness and body aches. These side effects can affect parts of the body that were not involved in surgery. Follow these instructions at home:  For at least 24 hours after the procedure:  Have a responsible adult stay with you. It is important to have someone help care for you until you are awake and alert.  Rest as needed.  Do not: ? Participate in activities in which you could fall or become injured. ? Drive. ? Use heavy machinery. ? Drink alcohol. ? Take sleeping pills or medicines that cause drowsiness. ? Make important decisions or sign legal documents. ? Take care of children on your own. Eating and drinking  Follow any instructions from your health care provider about eating or drinking restrictions.  When you feel hungry, start by eating small amounts of foods that are soft and easy to digest (bland), such as toast. Gradually return to your regular diet.  Drink enough fluid to keep your urine pale yellow.  If you vomit, rehydrate by drinking water, juice, or clear broth. General instructions  If you have sleep apnea, surgery and certain medicines can increase your risk for breathing problems. Follow instructions from your health care provider about wearing your sleep device: ? Anytime you are sleeping, including during daytime naps. ? While taking prescription pain medicines, sleeping medicines, or medicines that make you drowsy.  Return to  your normal activities as told by your health care provider. Ask your health care provider what activities are safe for you.  Take over-the-counter and prescription medicines only as told by your health care provider.  If you smoke, do not smoke without supervision.  Keep all follow-up visits as told by your health care provider. This is important. Contact a health care provider if:  You have nausea or vomiting that does not get better with medicine.  You cannot eat or drink without vomiting.  You have pain that does not get better with medicine.  You are unable to pass urine.  You develop a skin rash.  You have a fever.  You have redness around your IV site that gets worse. Get help right away if:  You have difficulty breathing.  You have chest pain.  You have blood in your urine or stool, or you vomit blood. Summary  After the procedure, it is common to have a sore throat or nausea. It is also common to feel tired.  Have a responsible adult stay with you for the first 24 hours after general anesthesia. It is important to have someone help care for you until you are awake and alert.  When you feel hungry, start by eating small amounts of foods that are soft and easy to digest (bland), such as toast. Gradually return to your regular diet.  Drink enough fluid to keep your urine pale yellow.  Return to your normal activities as told by your health care provider. Ask your health care provider what activities are safe for you. This information is not   intended to replace advice given to you by your health care provider. Make sure you discuss any questions you have with your health care provider. Document Revised: 02/04/2017 Document Reviewed: 09/17/2016 Elsevier Patient Education  2020 Elsevier Inc.  

## 2019-04-10 ENCOUNTER — Other Ambulatory Visit: Payer: Self-pay

## 2019-04-10 ENCOUNTER — Encounter: Payer: Self-pay | Admitting: Gastroenterology

## 2019-04-10 ENCOUNTER — Encounter
Admission: RE | Disposition: A | Discharge status: Home / Self Care | Payer: Self-pay | Source: Home / Self Care | Attending: Gastroenterology

## 2019-04-10 ENCOUNTER — Ambulatory Visit
Admission: RE | Admit: 2019-04-10 | Discharge: 2019-04-10 | Disposition: A | Discharge status: Home / Self Care | Payer: No Typology Code available for payment source | Attending: Gastroenterology | Admitting: Gastroenterology

## 2019-04-10 ENCOUNTER — Ambulatory Visit: Payer: No Typology Code available for payment source | Admitting: Anesthesiology

## 2019-04-10 DIAGNOSIS — J45909 Unspecified asthma, uncomplicated: Secondary | ICD-10-CM | POA: Diagnosis not present

## 2019-04-10 DIAGNOSIS — Z791 Long term (current) use of non-steroidal anti-inflammatories (NSAID): Secondary | ICD-10-CM | POA: Diagnosis not present

## 2019-04-10 DIAGNOSIS — Z800 Family history of malignant neoplasm of digestive organs: Secondary | ICD-10-CM

## 2019-04-10 DIAGNOSIS — Z1211 Encounter for screening for malignant neoplasm of colon: Secondary | ICD-10-CM | POA: Diagnosis not present

## 2019-04-10 DIAGNOSIS — K648 Other hemorrhoids: Secondary | ICD-10-CM | POA: Insufficient documentation

## 2019-04-10 DIAGNOSIS — Z793 Long term (current) use of hormonal contraceptives: Secondary | ICD-10-CM | POA: Insufficient documentation

## 2019-04-10 DIAGNOSIS — Z881 Allergy status to other antibiotic agents status: Secondary | ICD-10-CM | POA: Insufficient documentation

## 2019-04-10 DIAGNOSIS — Z7951 Long term (current) use of inhaled steroids: Secondary | ICD-10-CM | POA: Diagnosis not present

## 2019-04-10 HISTORY — DX: Headache, unspecified: R51.9

## 2019-04-10 HISTORY — PX: COLONOSCOPY WITH PROPOFOL: SHX5780

## 2019-04-10 HISTORY — DX: Motion sickness, initial encounter: T75.3XXA

## 2019-04-10 HISTORY — DX: Family history of other specified conditions: Z84.89

## 2019-04-10 HISTORY — DX: Presence of spectacles and contact lenses: Z97.3

## 2019-04-10 LAB — POCT PREGNANCY, URINE: Preg Test, Ur: NEGATIVE

## 2019-04-10 SURGERY — COLONOSCOPY WITH PROPOFOL
Anesthesia: General | Site: Rectum

## 2019-04-10 MED ORDER — PROPOFOL 10 MG/ML IV BOLUS
INTRAVENOUS | Status: DC | PRN
  Administered 2019-04-10: 40 mg via INTRAVENOUS
  Administered 2019-04-10 (×2): 30 mg via INTRAVENOUS
  Administered 2019-04-10 (×2): 40 mg via INTRAVENOUS
  Administered 2019-04-10: 30 mg via INTRAVENOUS
  Administered 2019-04-10: 140 mg via INTRAVENOUS

## 2019-04-10 MED ORDER — STERILE WATER FOR IRRIGATION IR SOLN
Status: DC | PRN
  Administered 2019-04-10: 50 mL

## 2019-04-10 MED ORDER — LACTATED RINGERS IV SOLN
100.0000 mL/h | INTRAVENOUS | Status: DC
  Administered 2019-04-10: 07:00:00 100 mL/h via INTRAVENOUS

## 2019-04-10 MED ORDER — LIDOCAINE HCL (CARDIAC) PF 100 MG/5ML IV SOSY
PREFILLED_SYRINGE | INTRAVENOUS | Status: DC | PRN
  Administered 2019-04-10: 30 mg via INTRAVENOUS

## 2019-04-10 SURGICAL SUPPLY — 5 items
CANISTER SUCT 1200ML W/VALVE (MISCELLANEOUS) ×3 IMPLANT
GOWN CVR UNV OPN BCK APRN NK (MISCELLANEOUS) ×2 IMPLANT
GOWN ISOL THUMB LOOP REG UNIV (MISCELLANEOUS) ×4
KIT ENDO PROCEDURE OLY (KITS) ×3 IMPLANT
WATER STERILE IRR 250ML POUR (IV SOLUTION) ×3 IMPLANT

## 2019-04-10 NOTE — H&P (Signed)
Lucilla Lame, MD [address] [place], [place] [address] Phone:[phone] Fax : [phone]  Primary Care Physician:  McLean-Scocuzza, Nino Glow, MD Primary Gastroenterologist:  Dr. Allen Norris  Pre-Procedure History & Physical: HPI:  Jackie Miller is a 39 y.o. female is here for an colonoscopy.   Past Medical History:  Diagnosis Date  . Asthma   . Asthma   . Bursitis    right hip  . Chicken pox   . Family history of adverse reaction to anesthesia    Father - slow to wake  . FH: colon cancer    mom died age 79 yo  . Hay fever   . Headache    tension/cluster  . Jaundice    as a baby  . Motion sickness    moving vehicles  . PCOS (polycystic ovarian syndrome)   . Wears contact lenses     Past Surgical History:  Procedure Laterality Date  . EYE SURGERY Bilateral 2006   lasik's  . MYRINGOTOMY WITH TUBE PLACEMENT    . TONSILLECTOMY AND ADENOIDECTOMY      Prior to Admission medications   Medication Sig Start Date End Date Taking? Authorizing Provider  Fluticasone-Salmeterol (ADVAIR DISKUS) 250-50 MCG/DOSE AEPB Inhale 1 puff into the lungs 2 (two) times daily. Rinse mouth 02/23/19  Yes McLean-Scocuzza, Nino Glow, MD  medroxyPROGESTERone (PROVERA) 10 MG tablet Take 1 tablet (10 mg total) by mouth daily. Use for ten days 08/24/18  Yes Shambley, Melody N, CNM  meloxicam (MOBIC) 15 MG tablet  10/03/14  Yes [provider]  montelukast (SINGULAIR) 10 MG tablet Take 1 tablet (10 mg total) by mouth at bedtime. 02/23/19  Yes McLean-Scocuzza, Nino Glow, MD  PROAIR HFA 108 [id number] Base) MCG/ACT inhaler Inhale 2 puffs into the lungs every 6 (six) hours as needed. 02/23/19  Yes McLean-Scocuzza, Nino Glow, MD  terconazole (TERAZOL 7) 0.4 % vaginal cream Place 1 applicator vaginally at bedtime. 08/24/18  Yes Shambley, Melody N, CNM    Allergies as of 02/27/2019 - Review Complete 02/23/2019  Allergen Reaction Noted  . Amoxicillin Swelling 04/18/2015  . Bacitracin Itching 04/18/2015  .  Doxycycline Other (See Comments) 05/18/2016    Family History  Problem Relation Age of Onset  . Colon cancer Mother 52  . Hypertension Father   . Breast cancer Maternal Grandmother   . Lung cancer Paternal Grandfather   . Breast cancer Cousin     Social History   Socioeconomic History  . Marital status: Single    Spouse name: Not on file  . Number of children: Not on file  . Years of education: Not on file  . Highest education level: Not on file  Occupational History  . Not on file  Tobacco Use  . Smoking status: Never Smoker  . Smokeless tobacco: Never Used  Substance and Sexual Activity  . Alcohol use: No    Alcohol/week: 0.0 - 1.0 standard drinks  . Drug use: No  . Sexual activity: Not Currently  Other Topics Concern  . 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 Difficulty of Paying Living Expenses:  Not on file  Food Insecurity:   . Worried About Charity fundraiser in the Last Year: Not on file  . Ran Out of Food in the Last Year: Not on file  Transportation Needs:   . Lack of Transportation (Medical): Not on file  . Lack of Transportation (Non-Medical): Not on file  Physical Activity:   . Days of Exercise per Week: Not on file  . Minutes of Exercise per Session: Not on file  Stress:   . Feeling of Stress : Not on file  Social Connections:   . Frequency of Communication with Friends and Family: Not on file  . Frequency of Social Gatherings with Friends and Family: Not on file  . Attends Religious Services: Not on file  . Active Member of Clubs or Organizations: Not on file  . Attends Archivist Meetings: Not on file  . Marital Status: Not on file  Intimate Partner Violence:   . Fear of  Current or Ex-Partner: Not on file  . Emotionally Abused: Not on file  . Physically Abused: Not on file  . Sexually Abused: Not on file    Review of Systems: See HPI, otherwise negative ROS  Physical Exam: Ht 5\' 7"  (1.702 m)   Wt 75.3 kg   LMP 03/06/2019 (Approximate) Comment: preg test negative  BMI 26.00 kg/m  General:   Alert,  pleasant and cooperative in NAD Head:  Normocephalic and atraumatic. Neck:  Supple; no masses or thyromegaly. Lungs:  Clear throughout to auscultation.    Heart:  Regular rate and rhythm. Abdomen:  Soft, nontender and nondistended. Normal bowel sounds, without guarding, and without rebound.   Neurologic:  Alert and  oriented x4;  grossly normal neurologically.  Impression/Plan: Jackie Miller is here for an colonoscopy to be performed for mother with colon cancer at 35  Risks, benefits, limitations, and alternatives regarding  colonoscopy have been reviewed with the patient.  Questions have been answered.  All parties agreeable.   Lucilla Lame, MD  04/10/2019, 7:13 AM

## 2019-04-10 NOTE — Anesthesia Postprocedure Evaluation (Signed)
Anesthesia Post Note  Patient: Jackie Miller  Procedure(s) Performed: COLONOSCOPY WITH PROPOFOL (N/A Rectum)     Patient location during evaluation: PACU Anesthesia Type: General Level of consciousness: awake and alert Pain management: pain level controlled Vital Signs Assessment: post-procedure vital signs reviewed and stable Respiratory status: spontaneous breathing, nonlabored ventilation, respiratory function stable and patient connected to nasal cannula oxygen Cardiovascular status: blood pressure returned to baseline and stable Postop Assessment: no apparent nausea or vomiting Anesthetic complications: no    Jud Fanguy

## 2019-04-10 NOTE — Anesthesia Preprocedure Evaluation (Signed)
Anesthesia Evaluation  Patient identified by MRN, date of birth, ID band Patient awake    Reviewed: NPO status   History of Anesthesia Complications Negative for: history of anesthetic complications  Airway Mallampati: II  TM Distance: >3 FB Neck ROM: full    Dental  (+) Chipped,    Pulmonary asthma ,    Pulmonary exam normal        Cardiovascular Exercise Tolerance: Good negative cardio ROS Normal cardiovascular exam     Neuro/Psych  Headaches, negative psych ROS   GI/Hepatic negative GI ROS, Neg liver ROS,   Endo/Other  negative endocrine ROS  Renal/GU negative Renal ROS   PCOS    Musculoskeletal Hip busitis   Abdominal   Peds  Hematology negative hematology ROS (+)   Anesthesia Other Findings Covid: NEG.  HCG: NEG.  Reproductive/Obstetrics negative OB ROS                             Anesthesia Physical Anesthesia Plan  ASA: II  Anesthesia Plan: General   Post-op Pain Management:    Induction:   PONV Risk Score and Plan: 2 and TIVA and Propofol infusion  Airway Management Planned:   Additional Equipment:   Intra-op Plan:   Post-operative Plan:   Informed Consent: I have reviewed the patients History and Physical, chart, labs and discussed the procedure including the risks, benefits and alternatives for the proposed anesthesia with the patient or authorized representative who has indicated his/her understanding and acceptance.       Plan Discussed with: CRNA  Anesthesia Plan Comments:         Anesthesia Quick Evaluation

## 2019-04-10 NOTE — Op Note (Signed)
[facility] Patient Name: Jackie Miller Procedure Date: 04/10/2019 7:16 AM MRN: [id number] Account #: [id number] Date of Birth: 1980-07-08 Admit Type: Outpatient Age: 39 Room: [facility] OR ROOM 01 Gender: Female Note Status: Finalized Procedure:             Colonoscopy Indications:           Family history of colon cancer in a first-degree                         relative before age 46 years Providers:             Lucilla Lame MD, MD Referring MD:          Nino Glow Mclean-Scocuzza MD, MD (Referring MD) Medicines:             Propofol per Anesthesia Complications:         No immediate complications. Procedure:             Pre-Anesthesia Assessment:                        - Prior to the procedure, a History and Physical was                         performed, and patient medications and allergies were                         reviewed. The patient's tolerance of previous                         anesthesia was also reviewed. The risks and benefits                         of the procedure and the sedation options and risks                         were discussed with the patient. All questions were                         answered, and informed consent was obtained. Prior                         Anticoagulants: The patient has taken no previous                         anticoagulant or antiplatelet agents. ASA Grade                         Assessment: II - A patient with mild systemic disease.                         After reviewing the risks and benefits, the patient                         was deemed in satisfactory condition to undergo the                         procedure.  After obtaining informed consent, the colonoscope was                         passed under direct vision. Throughout the procedure,                         the patient's blood pressure, pulse, and oxygen                         saturations were monitored continuously.  The                         Colonoscope was introduced through the anus and                         advanced to the the cecum, identified by appendiceal                         orifice and ileocecal valve. The colonoscopy was                         performed without difficulty. The patient tolerated                         the procedure well. The quality of the bowel                         preparation was excellent. Findings:      The perianal and digital rectal examinations were normal.      Non-bleeding internal hemorrhoids were found during retroflexion. The       hemorrhoids were Grade I (internal hemorrhoids that do not prolapse). Impression:            - Non-bleeding internal hemorrhoids.                        - No specimens collected. Recommendation:        - Repeat colonoscopy in 5 years for surveillance. Procedure Code(s):     --- Professional ---                        [phone], Colonoscopy, flexible; diagnostic, including                         collection of specimen(s) by brushing or washing, when                         performed (separate procedure) Diagnosis Code(s):     --- Professional ---                        Z80.0, Family history of malignant neoplasm of                         digestive organs CPT copyright 2019 American Medical Association. All rights reserved. The codes documented in this report are preliminary and upon coder review may  be revised to meet current compliance requirements. Lucilla Lame MD, MD 04/10/2019 7:54:34 AM This report has been signed electronically. Number of Addenda: 0 Note Initiated On: 04/10/2019 7:16 AM Scope Withdrawal  Time: 0 hours 10 minutes 42 seconds  Total Procedure Duration: 0 hours 14 minutes 12 seconds  Estimated Blood Loss:  Estimated blood loss: none.      [facility]

## 2019-04-10 NOTE — Transfer of Care (Signed)
Immediate Anesthesia Transfer of Care Note  Patient: Jackie Miller  Procedure(s) Performed: COLONOSCOPY WITH PROPOFOL (N/A Rectum)  Patient Location: PACU  Anesthesia Type: General  Level of Consciousness: awake, alert  and patient cooperative  Airway and Oxygen Therapy: Patient Spontanous Breathing and Patient connected to supplemental oxygen  Post-op Assessment: Post-op Vital signs reviewed, Patient's Cardiovascular Status Stable, Respiratory Function Stable, Patent Airway and No signs of Nausea or vomiting  Post-op Vital Signs: Reviewed and stable  Complications: No apparent anesthesia complications

## 2019-04-11 ENCOUNTER — Encounter: Payer: Self-pay | Admitting: *Deleted

## 2019-07-11 ENCOUNTER — Other Ambulatory Visit: Payer: Self-pay

## 2019-07-11 ENCOUNTER — Encounter: Payer: Self-pay | Admitting: Family Medicine

## 2019-07-11 ENCOUNTER — Ambulatory Visit: Payer: No Typology Code available for payment source | Admitting: Family Medicine

## 2019-07-11 DIAGNOSIS — M546 Pain in thoracic spine: Secondary | ICD-10-CM

## 2019-07-11 DIAGNOSIS — M549 Dorsalgia, unspecified: Secondary | ICD-10-CM | POA: Insufficient documentation

## 2019-07-11 MED ORDER — CYCLOBENZAPRINE HCL 10 MG PO TABS: 10.0000 mg | ORAL_TABLET | Freq: Three times a day (TID) | ORAL | 0 refills | Status: DC | PRN

## 2019-07-11 MED ORDER — NAPROXEN 500 MG PO TABS: 500.0000 mg | ORAL_TABLET | Freq: Two times a day (BID) | ORAL | 0 refills | Status: DC | PRN

## 2019-07-11 NOTE — Progress Notes (Signed)
Tommi Rumps, MD Phone: [phone]  Jackie Miller is a 39 y.o. female who presents today for same day visit.   Right back pain: Patient notes this started on Saturday.  She woke up with it and then she went to work out and that made it worse.  She notes no specific injury though she did lift her lawnmower on Friday evening and that may have contributed.  No radiation.  No numbness, weakness, or incontinence.  Moving precipitates it the last night did start to hurt when she was lying down.  Hurts to take deep breaths.  She has been taking ibuprofen 400 mg every 4 hours alternating with Tylenol 2 tablets every 4 hours.  Social History   Tobacco Use  Smoking Status Never Smoker  Smokeless Tobacco Never Used     ROS see history of present illness  Objective  Physical Exam Vitals:   07/11/19 1113  BP: 95/60  Pulse: 70  Temp: (!) 97.4 F (36.3 C)  SpO2: 99%    BP Readings from Last 3 Encounters:  07/11/19 95/60  04/10/19 106/74  08/24/18 130/77   Wt Readings from Last 3 Encounters:  07/11/19 151 lb 3.2 oz (68.6 kg)  04/10/19 162 lb (73.5 kg)  02/23/19 164 lb (74.4 kg)    Physical Exam Constitutional:      General: She is not in acute distress.    Appearance: She is not diaphoretic.  Cardiovascular:     Rate and Rhythm: Normal rate and regular rhythm.     Heart sounds: Normal heart sounds.  Pulmonary:     Effort: Pulmonary effort is normal.     Breath sounds: Normal breath sounds.  Musculoskeletal:       Back:     Comments: No midline spine tenderness, no midline spine step-off  Skin:    General: Skin is warm and dry.  Neurological:     Mental Status: She is alert.      Assessment/Plan: Please see individual problem list.  Back pain Suspect muscular strain though given that she is tender over the ribs I did discuss the possibility of rib fracture though that would seem unlikely given lack of specific injury.  Offered x-ray though she defers at this  time.  We will treat with a prescription strength anti-inflammatory and a muscle relaxer as outlined below.  Discussed taking the anti-inflammatory with food as it could irritate her stomach.  Discussed monitoring for drowsiness with the muscle relaxer.  Advised not to drive and take a muscle relaxer.  Advised not to take the muscle relaxer and go to work.  Given return precautions.    No orders of the defined types were placed in this encounter.   Meds ordered this encounter  Medications  . naproxen (NAPROSYN) 500 MG tablet    Sig: Take 1 tablet (500 mg total) by mouth 2 (two) times daily as needed for mild pain. Take with food    Dispense:  30 tablet    Refill:  0  . cyclobenzaprine (FLEXERIL) 10 MG tablet    Sig: Take 1 tablet (10 mg total) by mouth 3 (three) times daily as needed for muscle spasms.    Dispense:  30 tablet    Refill:  0    This visit occurred during the SARS-CoV-2 public health emergency.  Safety protocols were in place, including screening questions prior to the visit, additional usage of staff PPE, and extensive cleaning of exam room while observing appropriate contact time as indicated  for disinfecting solutions.    Tommi Rumps, MD [place]

## 2019-07-11 NOTE — Patient Instructions (Signed)
Nice to see you. We will treat you with a prescription strength anti-inflammatory called Naprosyn.  Please take this with food as it can irritate your stomach. We will also have you try a muscle relaxer.  This could make you drowsy so please do not drive and take this.  Please do not take this and go to work.  If you get excessively drowsy with this please discontinue it. If you develop numbness, weakness, bowel or bladder incontinence, significantly worsening pain, or new symptoms please seek medical attention immediately.

## 2019-07-11 NOTE — Assessment & Plan Note (Signed)
Suspect muscular strain though given that she is tender over the ribs I did discuss the possibility of rib fracture though that would seem unlikely given lack of specific injury.  Offered x-ray though she defers at this time.  We will treat with a prescription strength anti-inflammatory and a muscle relaxer as outlined below.  Discussed taking the anti-inflammatory with food as it could irritate her stomach.  Discussed monitoring for drowsiness with the muscle relaxer.  Advised not to drive and take a muscle relaxer.  Advised not to take the muscle relaxer and go to work.  Given return precautions.

## 2019-08-29 ENCOUNTER — Ambulatory Visit: Payer: 59 | Admitting: Internal Medicine

## 2019-10-04 ENCOUNTER — Other Ambulatory Visit: Payer: Self-pay

## 2019-10-04 ENCOUNTER — Encounter: Payer: Self-pay | Admitting: Internal Medicine

## 2019-10-04 ENCOUNTER — Ambulatory Visit (INDEPENDENT_AMBULATORY_CARE_PROVIDER_SITE_OTHER): Payer: 59 | Admitting: Internal Medicine

## 2019-10-04 VITALS — BP 118/78 | HR 87 | Temp 98.7°F | Ht 67.01 in | Wt 146.0 lb

## 2019-10-04 DIAGNOSIS — Z0000 Encounter for general adult medical examination without abnormal findings: Secondary | ICD-10-CM

## 2019-10-04 DIAGNOSIS — E559 Vitamin D deficiency, unspecified: Secondary | ICD-10-CM

## 2019-10-04 DIAGNOSIS — E538 Deficiency of other specified B group vitamins: Secondary | ICD-10-CM

## 2019-10-04 DIAGNOSIS — Z1329 Encounter for screening for other suspected endocrine disorder: Secondary | ICD-10-CM | POA: Diagnosis not present

## 2019-10-04 DIAGNOSIS — Z1283 Encounter for screening for malignant neoplasm of skin: Secondary | ICD-10-CM

## 2019-10-04 DIAGNOSIS — Z1389 Encounter for screening for other disorder: Secondary | ICD-10-CM | POA: Diagnosis not present

## 2019-10-04 DIAGNOSIS — J4520 Mild intermittent asthma, uncomplicated: Secondary | ICD-10-CM

## 2019-10-04 NOTE — Patient Instructions (Addendum)
Add fiber benefiber or metamucil  [facility] pap 11/19/19  Debrox ear wax drops    Constipation, Adult Constipation is when a person has fewer bowel movements in a week than normal, has difficulty having a bowel movement, or has stools that are dry, hard, or larger than normal. Constipation may be caused by an underlying condition. It may become worse with age if a person takes certain medicines and does not take in enough fluids. Follow these instructions at home: Eating and drinking   Eat foods that have a lot of fiber, such as fresh fruits and vegetables, whole grains, and beans.  Limit foods that are high in fat, low in fiber, or overly processed, such as french fries, hamburgers, cookies, candies, and soda.  Drink enough fluid to keep your urine clear or pale yellow. General instructions  Exercise regularly or as told by your health care provider.  Go to the restroom when you have the urge to go. Do not hold it in.  Take over-the-counter and prescription medicines only as told by your health care provider. These include any fiber supplements.  Practice pelvic floor retraining exercises, such as deep breathing while relaxing the lower abdomen and pelvic floor relaxation during bowel movements.  Watch your condition for any changes.  Keep all follow-up visits as told by your health care provider. This is important. Contact a health care provider if:  You have pain that gets worse.  You have a fever.  You do not have a bowel movement after 4 days.  You vomit.  You are not hungry.  You lose weight.  You are bleeding from the anus.  You have thin, pencil-like stools. Get help right away if:  You have a fever and your symptoms suddenly get worse.  You leak stool or have blood in your stool.  Your abdomen is bloated.  You have severe pain in your abdomen.  You feel dizzy or you faint. This information is not intended to replace advice given  to you by your health care provider. Make sure you discuss any questions you have with your health care provider. Document Revised: 01/14/2017 Document Reviewed: 07/23/2015 Elsevier Patient Education  2020 [employer].

## 2019-10-04 NOTE — Progress Notes (Signed)
Chief Complaint  Patient presents with  . Follow-up   Annual  1 doing well back pain better resolved injured while leaning while moving the lawn 06/2019  2. Wants derm referral Dr. Kellie Moor only for tbse  3. Weight down 30 pounds drinking 1 gallon of water daily agreeable to labs today. Weight loss healthy diet and exercise and reducing sugars feels better 4. Asthma controlled w/o using advair 250/50 prn proair and singular will use    Review of Systems  Constitutional: Positive for weight loss.       Wt loss trying   HENT: Negative for hearing loss.        +ear wax  Eyes: Negative for blurred vision.  Respiratory: Negative for shortness of breath.   Cardiovascular: Negative for chest pain.  Gastrointestinal: Positive for constipation. Negative for abdominal pain.  Musculoskeletal: Negative for back pain.  Skin: Negative for rash.  Neurological: Negative for headaches.  Psychiatric/Behavioral: Negative for depression.   Past Medical History:  Diagnosis Date  . Asthma   . Asthma   . Bursitis    right hip  . Chicken pox   . Family history of adverse reaction to anesthesia    Father - slow to wake  . FH: colon cancer    mom died age 53 yo  . Hay fever   . Headache    tension/cluster  . Jaundice    as a baby  . Motion sickness    moving vehicles  . PCOS (polycystic ovarian syndrome)   . Wears contact lenses    Past Surgical History:  Procedure Laterality Date  . COLONOSCOPY WITH PROPOFOL N/A 04/10/2019   Procedure: COLONOSCOPY WITH PROPOFOL;  Surgeon: Lucilla Lame, MD;  Location: [facility];  Service: Endoscopy;  Laterality: N/A;  . EYE SURGERY Bilateral 2006   lasik's  . MYRINGOTOMY WITH TUBE PLACEMENT    . TONSILLECTOMY AND ADENOIDECTOMY     Family History  Problem Relation Age of Onset  . Colon cancer Mother 73  . Hypertension Father   . Breast cancer Maternal Grandmother   . Lung cancer Paternal Grandfather   . Breast cancer Cousin    Social  History   Socioeconomic History  . Marital status: Single    Spouse name: Not on file  . Number of children: Not on file  . Years of education: Not on file  . Highest education level: Not on file  Occupational History  . Not on file  Tobacco Use  . Smoking status: Never Smoker  . Smokeless tobacco: Never Used  Vaping Use  . Vaping Use: Never used  Substance and Sexual Activity  . Alcohol use: No    Alcohol/week: 0.0 - 1.0 standard drinks  . Drug use: No  . Sexual activity: Not Currently  Other Topics Concern  . 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 Difficulty of Paying Living Expenses: Not on file  Food Insecurity:   . Worried About Charity fundraiser in the Last Year: Not on file  . Ran Out of Food in the Last Year: Not on file  Transportation  Needs:   . Lack of Transportation (Medical): Not on file  . Lack of Transportation (Non-Medical): Not on file  Physical Activity:   . Days of Exercise per Week: Not on file  . Minutes of Exercise per Session: Not on file  Stress:   . Feeling of Stress : Not on file  Social Connections:   . Frequency of Communication with Friends and Family: Not on file  . Frequency of Social Gatherings with Friends and Family: Not on file  . Attends Religious Services: Not on file  . Active Member of Clubs or Organizations: Not on file  . Attends Archivist Meetings: Not on file  . Marital Status: Not on file  Intimate Partner Violence:   . Fear of Current or Ex-Partner: Not on file  . Emotionally Abused: Not on file  . Physically Abused: Not on file  . Sexually Abused: Not on file   Current Meds  Medication Sig  . medroxyPROGESTERone (PROVERA) 10 MG tablet Take  1 tablet (10 mg total) by mouth daily. Use for ten days  . montelukast (SINGULAIR) 10 MG tablet Take 1 tablet (10 mg total) by mouth at bedtime.  Marland Kitchen PROAIR HFA 108 (90 Base) MCG/ACT inhaler Inhale 2 puffs into the lungs every 6 (six) hours as needed.  . [DISCONTINUED] Fluticasone-Salmeterol (ADVAIR DISKUS) 250-50 MCG/DOSE AEPB Inhale 1 puff into the lungs 2 (two) times daily. Rinse mouth   Allergies  Allergen Reactions  . Amoxicillin Swelling  . Bacitracin Itching  . Doxycycline Other (See Comments)    Muscloskeletal pain   Recent Results (from the past 2160 hour(s))  Vitamin B12     Status: None   Collection Time: 10/04/19  4:23 PM  Result Value Ref Range   Vitamin B-12 247 211 - 911 pg/mL  Vitamin D (25 hydroxy)     Status: Abnormal   Collection Time: 10/04/19  4:23 PM  Result Value Ref Range   VITD 25.36 (L) 30.00 - 100.00 ng/mL  TSH     Status: None   Collection Time: 10/04/19  4:23 PM  Result Value Ref Range   TSH 1.88 0.35 - 4.50 uIU/mL  CBC with Differential/Platelet     Status: None   Collection Time: 10/04/19  4:23 PM  Result Value Ref Range   WBC 9.1 4.0 - 10.5 K/uL   RBC 4.46 3.87 - 5.11 Mil/uL   Hemoglobin 13.2 12.0 - 15.0 g/dL   HCT 39.2 36 - 46 %   MCV 87.9 78.0 - 100.0 fl   MCHC 33.6 30.0 - 36.0 g/dL   RDW 12.0 11.5 - 15.5 %   Platelets 329.0 150 - 400 K/uL   Neutrophils Relative % 64.7 43 - 77 %   Lymphocytes Relative 24.3 12 - 46 %   Monocytes Relative 8.5 3 - 12 %   Eosinophils Relative 1.4 0 - 5 %   Basophils Relative 1.1 0 - 3 %   Neutro Abs 5.9 1.4 - 7.7 K/uL   Lymphs Abs 2.2 0.7 - 4.0 K/uL   Monocytes Absolute 0.8 0 - 1 K/uL   Eosinophils Absolute 0.1 0 - 0 K/uL   Basophils Absolute 0.1 0 - 0 K/uL  Comprehensive metabolic panel     Status: None   Collection Time: 10/04/19  4:23 PM  Result Value Ref Range   Sodium 140 135 - 145 mEq/L   Potassium 4.3 3.5 - 5.1 mEq/L   Chloride 107 96 - 112 mEq/L   CO2 26  19 - 32 mEq/L   Glucose, Bld 90 70 - 99  mg/dL   BUN 12 6 - 23 mg/dL   Creatinine, Ser 0.87 0.40 - 1.20 mg/dL   Total Bilirubin 0.4 0.2 - 1.2 mg/dL   Alkaline Phosphatase 40 39 - 117 U/L   AST 12 0 - 37 U/L   ALT 8 0 - 35 U/L   Total Protein 6.6 6.0 - 8.3 g/dL   Albumin 4.4 3.5 - 5.2 g/dL   GFR 72.83 >60.00 mL/min   Calcium 9.4 8.4 - 10.5 mg/dL  Urinalysis, Routine w reflex microscopic     Status: None   Collection Time: 10/04/19  4:24 PM  Result Value Ref Range   Color, Urine YELLOW YELLOW   APPearance CLEAR CLEAR   Specific Gravity, Urine 1.008 1.001 - 1.03   pH 6.0 5.0 - 8.0   Glucose, UA NEGATIVE NEGATIVE   Bilirubin Urine NEGATIVE NEGATIVE   Ketones, ur NEGATIVE NEGATIVE   Hgb urine dipstick NEGATIVE NEGATIVE   Protein, ur NEGATIVE NEGATIVE   Nitrite NEGATIVE NEGATIVE   Leukocytes,Ua NEGATIVE NEGATIVE   Objective  Body mass index is 22.86 kg/m. Wt Readings from Last 3 Encounters:  10/04/19 146 lb (66.2 kg)  07/11/19 151 lb 3.2 oz (68.6 kg)  04/10/19 162 lb (73.5 kg)   Temp Readings from Last 3 Encounters:  10/04/19 98.7 F (37.1 C)  07/11/19 (!) 97.4 F (36.3 C) (Temporal)  04/10/19 (!) 97.5 F (36.4 C)   BP Readings from Last 3 Encounters:  10/04/19 118/78  07/11/19 95/60  04/10/19 106/74   Pulse Readings from Last 3 Encounters:  10/04/19 87  07/11/19 70  04/10/19 69    Physical Exam Vitals and nursing note reviewed.  Constitutional:      Appearance: Normal appearance. She is well-developed and well-groomed.  HENT:     Head: Normocephalic and atraumatic.  Eyes:     Conjunctiva/sclera: Conjunctivae normal.     Pupils: Pupils are equal, round, and reactive to light.  Cardiovascular:     Rate and Rhythm: Normal rate and regular rhythm.     Heart sounds: Normal heart sounds. No murmur heard.   Pulmonary:     Effort: Pulmonary effort is normal.     Breath sounds: Normal breath sounds.  Abdominal:     General: Abdomen is flat. Bowel sounds are normal.     Tenderness: There is no  abdominal tenderness.  Skin:    General: Skin is warm and dry.  Neurological:     General: No focal deficit present.     Mental Status: She is alert and oriented to person, place, and time. Mental status is at baseline.     Gait: Gait normal.  Psychiatric:        Attention and Perception: Attention and perception normal.        Mood and Affect: Mood and affect normal.        Speech: Speech normal.        Behavior: Behavior normal. Behavior is cooperative.        Thought Content: Thought content normal.        Cognition and Memory: Cognition and memory normal.        Judgment: Judgment normal.     Assessment  Plan  Annual physical exam -  Non Fasting labs today  Declines flu shot  Declines covid 19 vaccine  Tdap utd  Pap 11/18/16 neg neg HPV -will need new OB/GYN in future with h/o pcos  disc [facility] in [place], Dr. Blima Rich [facility] vs [facility]  referred derm in the future Dr. Kellie Moor GI-referred Dr. Allen Norris overdue screening colonoscopy FH mom died age 25 colon cancer former saw Dr. Tiffany Kocher who stated she had long colon and caution with colonoscopies per pt last colonoscopy per pt in [facility]  04/10/19 colonoscopy Dr. Allen Norris hemorrhoids otherwise negative f/u 5 years +FH mom colon cancer and died of this Consider hep C/HIV in future will disc lab check with pt before doing  Cont healthy diet and exercise congrats on weight loss   Mild intermittent asthma without complication  Controlled with singulair and prn Proair  For now d/c advair 250/50 not using   Provider: Dr. Olivia Mackie McLean-Scocuzza-Internal Medicine

## 2019-10-05 LAB — VITAMIN D 25 HYDROXY (VIT D DEFICIENCY, FRACTURES): VITD: 25.36 ng/mL — ABNORMAL LOW (ref 30.00–100.00)

## 2019-10-05 LAB — CBC WITH DIFFERENTIAL/PLATELET
Basophils Absolute: 0.1 10*3/uL (ref 0.0–0.1)
Basophils Relative: 1.1 % (ref 0.0–3.0)
Eosinophils Absolute: 0.1 10*3/uL (ref 0.0–0.7)
Eosinophils Relative: 1.4 % (ref 0.0–5.0)
HCT: 39.2 % (ref 36.0–46.0)
Hemoglobin: 13.2 g/dL (ref 12.0–15.0)
Lymphocytes Relative: 24.3 % (ref 12.0–46.0)
Lymphs Abs: 2.2 10*3/uL (ref 0.7–4.0)
MCHC: 33.6 g/dL (ref 30.0–36.0)
MCV: 87.9 fl (ref 78.0–100.0)
Monocytes Absolute: 0.8 10*3/uL (ref 0.1–1.0)
Monocytes Relative: 8.5 % (ref 3.0–12.0)
Neutro Abs: 5.9 10*3/uL (ref 1.4–7.7)
Neutrophils Relative %: 64.7 % (ref 43.0–77.0)
Platelets: 329 10*3/uL (ref 150.0–400.0)
RBC: 4.46 Mil/uL (ref 3.87–5.11)
RDW: 12 % (ref 11.5–15.5)
WBC: 9.1 10*3/uL (ref 4.0–10.5)

## 2019-10-05 LAB — URINALYSIS, ROUTINE W REFLEX MICROSCOPIC
Bilirubin Urine: NEGATIVE
Glucose, UA: NEGATIVE
Hgb urine dipstick: NEGATIVE
Ketones, ur: NEGATIVE
Leukocytes,Ua: NEGATIVE
Nitrite: NEGATIVE
Protein, ur: NEGATIVE
Specific Gravity, Urine: 1.008 (ref 1.001–1.03)
pH: 6 (ref 5.0–8.0)

## 2019-10-05 LAB — COMPREHENSIVE METABOLIC PANEL
ALT: 8 U/L (ref 0–35)
AST: 12 U/L (ref 0–37)
Albumin: 4.4 g/dL (ref 3.5–5.2)
Alkaline Phosphatase: 40 U/L (ref 39–117)
BUN: 12 mg/dL (ref 6–23)
CO2: 26 mEq/L (ref 19–32)
Calcium: 9.4 mg/dL (ref 8.4–10.5)
Chloride: 107 mEq/L (ref 96–112)
Creatinine, Ser: 0.87 mg/dL (ref 0.40–1.20)
GFR: 72.83 mL/min (ref >60.00)
Glucose, Bld: 90 mg/dL (ref 70–99)
Potassium: 4.3 mEq/L (ref 3.5–5.1)
Sodium: 140 mEq/L (ref 135–145)
Total Bilirubin: 0.4 mg/dL (ref 0.2–1.2)
Total Protein: 6.6 g/dL (ref 6.0–8.3)

## 2019-10-05 LAB — TSH: TSH: 1.88 u[IU]/mL (ref 0.35–4.50)

## 2019-10-05 LAB — VITAMIN B12: Vitamin B-12: 247 pg/mL (ref 211–911)

## 2019-10-08 DIAGNOSIS — Z0000 Encounter for general adult medical examination without abnormal findings: Secondary | ICD-10-CM | POA: Insufficient documentation

## 2020-07-01 ENCOUNTER — Telehealth: Payer: Self-pay | Admitting: Internal Medicine

## 2020-07-01 NOTE — Telephone Encounter (Signed)
Pt is scheduled for 8am 5/18  Pt tested positive for covid on 5/15, she is having a cough,headache and chest congestion

## 2020-07-01 NOTE — Telephone Encounter (Signed)
Noted  

## 2020-07-02 ENCOUNTER — Telehealth (INDEPENDENT_AMBULATORY_CARE_PROVIDER_SITE_OTHER): Payer: 59 | Admitting: Adult Health

## 2020-07-02 ENCOUNTER — Encounter: Payer: Self-pay | Admitting: Adult Health

## 2020-07-02 VITALS — Ht 67.01 in | Wt 146.0 lb

## 2020-07-02 DIAGNOSIS — R059 Cough, unspecified: Secondary | ICD-10-CM | POA: Diagnosis not present

## 2020-07-02 DIAGNOSIS — J4520 Mild intermittent asthma, uncomplicated: Secondary | ICD-10-CM | POA: Diagnosis not present

## 2020-07-02 DIAGNOSIS — U071 COVID-19: Secondary | ICD-10-CM | POA: Diagnosis not present

## 2020-07-02 MED ORDER — PROAIR HFA 108 (90 BASE) MCG/ACT IN AERS: 2.0000 | INHALATION_SPRAY | Freq: Four times a day (QID) | RESPIRATORY_TRACT | 1 refills | Status: AC | PRN

## 2020-07-02 MED ORDER — PREDNISONE 10 MG (21) PO TBPK: ORAL_TABLET | ORAL | 0 refills | Status: DC

## 2020-07-02 NOTE — Progress Notes (Signed)
Virtual Visit via Video Note  I connected with Jackie Miller on 07/02/20 at  8:00 AM EDT by a video enabled telemedicine application and verified that I am speaking with the correct person using two identifiers. Parties involved in visit as below:   Location: Patient: at home  Provider: Provider: Provider's office at  [facility], [place].   I discussed the limitations of evaluation and management by telemedicine and the availability of in person appointments. The patient expressed understanding and agreed to proceed.  History of Present Illness:  Patient is covid positive tested on 06/29/20.  She started on 06/28/20.   She has chest tightness, bronchial cough.  She has mildly productive cough. Clear sputum.  She reports she feels well otherwise and is just having some mild chest tightness and wheezing.  Denies any distress. She has been taking Mucinex and using albuterol and is helping.   No LMP recorded.- LMP was 06/05/20. Denies any chance of pregnancy .Afebrile.   Patient  denies any fever, body aches,chills, rash, chest pain, shortness of breath, nausea, vomiting, or diarrhea.    Observations/Objective:  Patient is alert and oriented and responsive to questions Engages in conversation with provider. Speaks in full sentences without any pauses without any shortness of breath or distress.   Assessment and Plan: 1. COVID-19 Discussed likely course and over the counter symptom management with over the counter medication per package instructions.   2. Cough Declined cough medication prescription. She is taking Mucinex and also over the counter cough medication and doing well.  - predniSONE (STERAPRED UNI-PAK 21 TAB) 10 MG (21) TBPK tablet; PO: Take 6 tablets on day 1:Take 5 tablets day 2:Take 4 tablets day 3: Take 3 tablets day 4:Take 2 tablets day five: 5 Take 1 tablet day 6  Dispense: 21 tablet; Refill: 0  3. Mild intermittent asthmatic bronchitis  without complication Refill sent of Pro air only use as directed.  - predniSONE (STERAPRED UNI-PAK 21 TAB) 10 MG (21) TBPK tablet; PO: Take 6 tablets on day 1:Take 5 tablets day 2:Take 4 tablets day 3: Take 3 tablets day 4:Take 2 tablets day five: 5 Take 1 tablet day 6  Dispense: 21 tablet; Refill: 0 - PROAIR HFA 108 (90 Base) MCG/ACT inhaler; Inhale 2 puffs into the lungs every 6 (six) hours as needed.  Dispense: 18 g; Refill: 1  Follow Up Instructions:   Advised in person evaluation at anytime is advised if any symptoms do not improve, worsen or change at any given time.  Red Flags discussed. The patient was given clear instructions to go to ER or return to medical center if any red flags develop, symptoms do not improve, worsen or new problems develop. They verbalized understanding.   I discussed the assessment and treatment plan with the patient. The patient was provided an opportunity to ask questions and all were answered. The patient agreed with the plan and demonstrated an understanding of the instructions.   The patient was advised to call back or seek an in-person evaluation if the symptoms worsen or if the condition fails to improve as anticipated.    Jackie Buffy, FNP

## 2020-07-04 ENCOUNTER — Telehealth: Payer: Self-pay

## 2020-07-04 NOTE — Telephone Encounter (Signed)
Submitted a prior authorization for Proair HFA oral inhaler 8.5 G Key- [id number]

## 2020-09-25 ENCOUNTER — Encounter: Payer: Self-pay | Admitting: Internal Medicine

## 2020-10-07 ENCOUNTER — Encounter: Payer: 59 | Admitting: Internal Medicine

## 2020-11-26 ENCOUNTER — Encounter: Payer: 59 | Admitting: Internal Medicine

## 2021-01-28 DIAGNOSIS — M2241 Chondromalacia patellae, right knee: Secondary | ICD-10-CM | POA: Insufficient documentation

## 2021-03-03 ENCOUNTER — Other Ambulatory Visit: Payer: Self-pay

## 2021-03-03 ENCOUNTER — Ambulatory Visit
Admission: EM | Admit: 2021-03-03 | Discharge: 2021-03-03 | Disposition: A | Discharge status: Home / Self Care | Payer: 59

## 2021-03-03 DIAGNOSIS — J45901 Unspecified asthma with (acute) exacerbation: Secondary | ICD-10-CM | POA: Diagnosis not present

## 2021-03-03 DIAGNOSIS — R051 Acute cough: Secondary | ICD-10-CM | POA: Diagnosis not present

## 2021-03-03 DIAGNOSIS — B349 Viral infection, unspecified: Secondary | ICD-10-CM | POA: Diagnosis not present

## 2021-03-03 MED ORDER — PREDNISONE 20 MG PO TABS: 40.0000 mg | ORAL_TABLET | Freq: Every day | ORAL | 0 refills | Status: AC

## 2021-03-03 MED ORDER — HYDROCOD POLST-CPM POLST ER 10-8 MG/5ML PO SUER: 5.0000 mL | Freq: Every evening | ORAL | 0 refills | Status: DC | PRN

## 2021-03-03 NOTE — Discharge Instructions (Addendum)
-  You have a virus.  I think this is exacerbated your asthma.  I sent prednisone and cough medicine to pharmacy. - Increase rest and fluids. - You should feel better over the next 1 to 2 weeks. - You should be seen again if you develop a fever or more breathing difficulty. - Use your inhaler as needed. - Since you did not get COVID tested, you should wait at least 3 days to return to work and then wear a mask for 5 days.

## 2021-03-03 NOTE — ED Provider Notes (Signed)
[facility]    CSN: [id number] Arrival date & time: 03/03/21  0818      History   Chief Complaint Chief Complaint  Patient presents with   Cough   Nasal Congestion    HPI Jackie Miller is a 41 y.o. female with history of asthma.  Patient presents today for cough and congestion x2 days.  She says the cough is productive and she has chest tightness, wheezing and shortness of breath.  Has been using inhaler more than normal.  Reports she believes she has bronchitis.  No sick contacts or known exposure to COVID or flu.  Has not been tested for COVID.  Has been taking Mucinex DM over-the-counter which has helped some but she states she is having a lot of problems sleeping at night.  Refuses to be COVID tested.  No other complaints.  HPI  Past Medical History:  Diagnosis Date   Asthma    Asthma    Bursitis    right hip   Chicken pox    Family history of adverse reaction to anesthesia    Father - slow to wake   FH: colon cancer    mom died age 35 yo   Hay fever    Headache    tension/cluster   Jaundice    as a baby   Motion sickness    moving vehicles   PCOS (polycystic ovarian syndrome)    Wears contact lenses     Patient Active Problem List   Diagnosis Date Noted   COVID-19 07/02/2020   Cough 07/02/2020   Annual physical exam 10/08/2019   Back pain 07/11/2019   Family history of malignant neoplasm of gastrointestinal tract    Family history of colon cancer requiring screening colonoscopy 02/26/2019   Acute bronchitis 01/28/2016   Chronic fatigue 04/27/2015   PCOS (polycystic ovarian syndrome) 04/27/2015   Intolerance to cold 04/27/2015   Asthmatic bronchitis 04/27/2015    Past Surgical History:  Procedure Laterality Date   COLONOSCOPY WITH PROPOFOL N/A 04/10/2019   Procedure: COLONOSCOPY WITH PROPOFOL;  Surgeon: Lucilla Lame, MD;  Location: [facility];  Service: Endoscopy;  Laterality: N/A;   EYE SURGERY Bilateral 2006   lasik's    MYRINGOTOMY WITH TUBE PLACEMENT     TONSILLECTOMY AND ADENOIDECTOMY      OB History     Gravida  0   Para  0   Term  0   Preterm  0   AB  0   Living  0      SAB  0   IAB  0   Ectopic  0   Multiple  0   Live Births  0            Home Medications    Prior to Admission medications   Medication Sig Start Date End Date Taking? Authorizing Provider  chlorpheniramine-HYDROcodone (TUSSIONEX PENNKINETIC ER) 10-8 MG/5ML SUER Take 5 mLs by mouth at bedtime as needed for cough. 03/03/21  Yes Danton Clap, PA-C  predniSONE (DELTASONE) 20 MG tablet Take 2 tablets (40 mg total) by mouth daily for 5 days. 03/03/21 03/08/21 Yes Danton Clap, PA-C  medroxyPROGESTERone (PROVERA) 10 MG tablet Take 1 tablet (10 mg total) by mouth daily. Use for ten days 08/24/18   Shambley, Melody N, CNM  meloxicam (MOBIC) 7.5 MG tablet meloxicam 7.5 mg tablet  TAKE 1 TABLET BY MOUTH TWICE DAILY    [provider]  montelukast (SINGULAIR) 10 MG tablet  Take 1 tablet (10 mg total) by mouth at bedtime. 02/23/19   McLean-Scocuzza, Nino Glow, MD  PROAIR HFA 108 [phone] Base) MCG/ACT inhaler Inhale 2 puffs into the lungs every 6 (six) hours as needed. 07/02/20   Flinchum, Kelby Aline, FNP    Family History Family History  Problem Relation Age of Onset   Colon cancer Mother 23   Hypertension Father    Breast cancer Maternal Grandmother    Lung cancer Paternal Grandfather    Breast cancer Cousin     Social History Social History   Tobacco Use   Smoking status: Never   Smokeless tobacco: Never  Vaping Use   Vaping Use: Never used  Substance Use Topics   Alcohol use: No    Alcohol/week: 0.0 - 1.0 standard drinks   Drug use: No     Allergies   Amoxicillin, Bacitracin, and Doxycycline   Review of Systems Review of Systems  Constitutional:  Negative for chills, diaphoresis, fatigue and fever.  HENT:  Positive for congestion and rhinorrhea. Negative for ear pain, sinus pressure, sinus  pain and sore throat.   Respiratory:  Positive for cough, chest tightness, shortness of breath and wheezing.   Gastrointestinal:  Negative for abdominal pain, nausea and vomiting.  Musculoskeletal:  Negative for arthralgias and myalgias.  Skin:  Negative for rash.  Neurological:  Negative for weakness and headaches.  Hematological:  Negative for adenopathy.    Physical Exam Triage Vital Signs ED Triage Vitals  Enc Vitals Group     BP 03/03/21 0845 109/76     Pulse Rate 03/03/21 0845 83     Resp 03/03/21 0845 16     Temp 03/03/21 0845 98.2 F (36.8 C)     Temp Source 03/03/21 0845 Oral     SpO2 03/03/21 0845 100 %     Weight --      Height --      Head Circumference --      Peak Flow --      Pain Score 03/03/21 0843 0     Pain Loc --      Pain Edu? --      Excl. in [place]? --    No data found.  Updated Vital Signs BP 109/76 (BP Location: Left Arm)    Pulse 83    Temp 98.2 F (36.8 C) (Oral)    Resp 16    LMP 01/30/2021    SpO2 100%      Physical Exam Vitals and nursing note reviewed.  Constitutional:      General: She is not in acute distress.    Appearance: Normal appearance. She is not ill-appearing or toxic-appearing.  HENT:     Head: Normocephalic and atraumatic.     Nose: Congestion present.     Mouth/Throat:     Mouth: Mucous membranes are moist.     Pharynx: Oropharynx is clear.  Eyes:     General: No scleral icterus.       Right eye: No discharge.        Left eye: No discharge.     Conjunctiva/sclera: Conjunctivae normal.  Cardiovascular:     Rate and Rhythm: Normal rate and regular rhythm.     Heart sounds: Normal heart sounds.  Pulmonary:     Effort: Pulmonary effort is normal. No respiratory distress.     Breath sounds: Normal breath sounds.  Musculoskeletal:     Cervical back: Neck supple.  Skin:    General: Skin is dry.  Neurological:     General: No focal deficit present.     Mental Status: She is alert. Mental status is at baseline.      Motor: No weakness.     Gait: Gait normal.  Psychiatric:        Mood and Affect: Mood normal.        Behavior: Behavior normal.        Thought Content: Thought content normal.     UC Treatments / Results  Labs (all labs ordered are listed, but only abnormal results are displayed) Labs Reviewed - No data to display  EKG   Radiology No results found.  Procedures Procedures (including critical care time)  Medications Ordered in UC Medications - No data to display  Initial Impression / Assessment and Plan / UC Course  I have reviewed the triage vital signs and the nursing notes.  Pertinent labs & imaging results that were available during my care of the patient were reviewed by me and considered in my medical decision making (see chart for details).  41 year old female presenting for cough and congestion x2 days.  Also chest tightness and shortness of breath.  History of asthma.  Vitals all normal and stable patient is overall well-appearing.  She is afebrile.  She does have nasal current health exam that is mild.  Chest clear to auscultation and heart regular rate and rhythm.  Advise COVID testing but she declines.  States she will stay home for the next couple of days.  Advised her symptoms consistent with a viral illness and flareup of her asthma.  Advised increasing rest and fluids and continuing Mucinex DM.  We will add prednisone and Tussionex at bedtime only.  Use inhaler as needed.  Work note provided.  Reviewed return and ER precautions.   Final Clinical Impressions(s) / UC Diagnoses   Final diagnoses:  Viral illness  Acute cough  Asthma with acute exacerbation, unspecified asthma severity, unspecified whether persistent     Discharge Instructions      -You have a virus.  I think this is exacerbated your asthma.  I sent prednisone and cough medicine to pharmacy. - Increase rest and fluids. - You should feel better over the next 1 to 2 weeks. - You should be  seen again if you develop a fever or more breathing difficulty. - Use your inhaler as needed. - Since you did not get COVID tested, you should wait at least 3 days to return to work and then wear a mask for 5 days.     ED Prescriptions     Medication Sig Dispense Auth. Provider   predniSONE (DELTASONE) 20 MG tablet Take 2 tablets (40 mg total) by mouth daily for 5 days. 10 tablet Danton Clap, PA-C   chlorpheniramine-HYDROcodone (TUSSIONEX PENNKINETIC ER) 10-8 MG/5ML SUER Take 5 mLs by mouth at bedtime as needed for cough. 50 mL Danton Clap, PA-C      I have reviewed the PDMP during this encounter.   Danton Clap, PA-C 03/03/21 [phone]

## 2021-03-03 NOTE — ED Triage Notes (Signed)
Patient presents to Urgent Care with complaints of cough, congestion x 2 days ago. Treating symptoms with mucinex and inhaler.   Denies fever.

## 2021-03-24 ENCOUNTER — Other Ambulatory Visit: Payer: Self-pay | Admitting: Obstetrics and Gynecology

## 2021-03-24 DIAGNOSIS — R928 Other abnormal and inconclusive findings on diagnostic imaging of breast: Secondary | ICD-10-CM

## 2021-04-13 ENCOUNTER — Ambulatory Visit
Admission: RE | Admit: 2021-04-13 | Discharge: 2021-04-13 | Disposition: A | Discharge status: Home / Self Care | Payer: 59 | Source: Ambulatory Visit | Attending: Obstetrics and Gynecology | Admitting: Obstetrics and Gynecology

## 2021-04-13 ENCOUNTER — Other Ambulatory Visit: Payer: Self-pay | Admitting: Obstetrics and Gynecology

## 2021-04-13 DIAGNOSIS — N6310 Unspecified lump in the right breast, unspecified quadrant: Secondary | ICD-10-CM

## 2021-04-13 DIAGNOSIS — R921 Mammographic calcification found on diagnostic imaging of breast: Secondary | ICD-10-CM

## 2021-04-13 DIAGNOSIS — R928 Other abnormal and inconclusive findings on diagnostic imaging of breast: Secondary | ICD-10-CM

## 2021-04-13 IMAGING — MG MM DIGITAL DIAGNOSTIC UNILAT*R* W/ TOMO W/ CAD
8 of 10 series · 8 of 22 positions shown · non-contrast
Comparison: Previous exam(s).

CLINICAL DATA: 40-year-old female presenting for recall from
screening mammogram for a right breast distortion and
calcifications.

EXAM:
DIGITAL DIAGNOSTIC UNILATERAL RIGHT MAMMOGRAM WITH TOMOSYNTHESIS AND
CAD; ULTRASOUND RIGHT BREAST LIMITED
TECHNIQUE: Right digital diagnostic mammography and breast tomosynthesis was
performed. The images were evaluated with computer-aided detection.;
Targeted ultrasound examination of the right breast was performed

[R ML (1 of 2)]
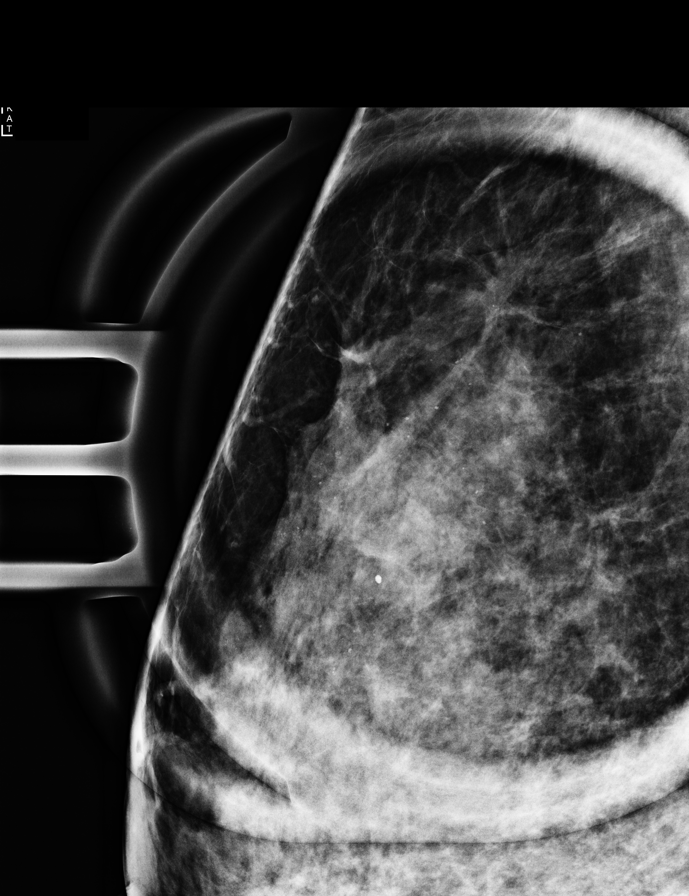

[R CC (1 of 2)]
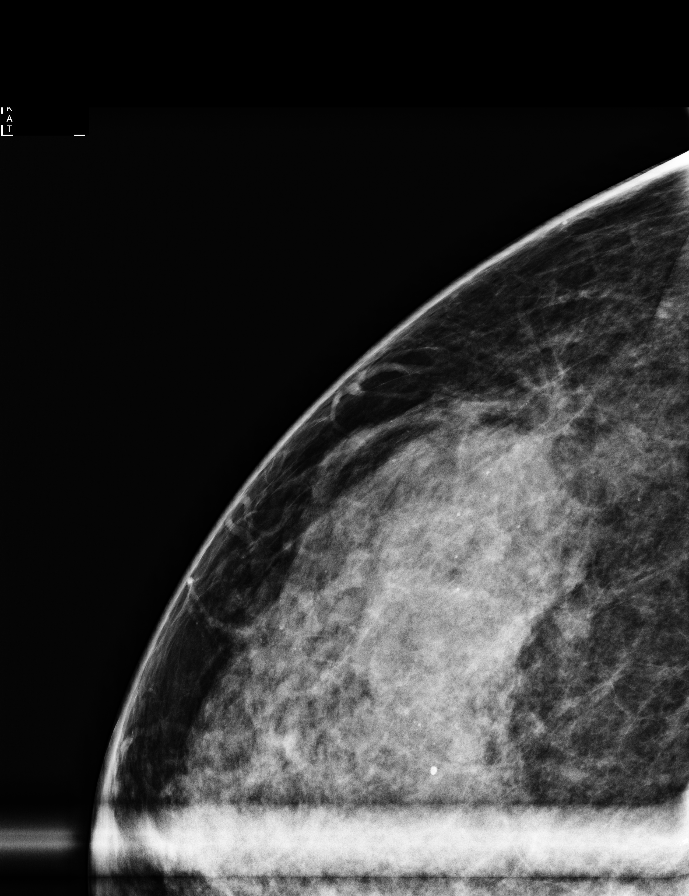

[R CC (2 of 2)]
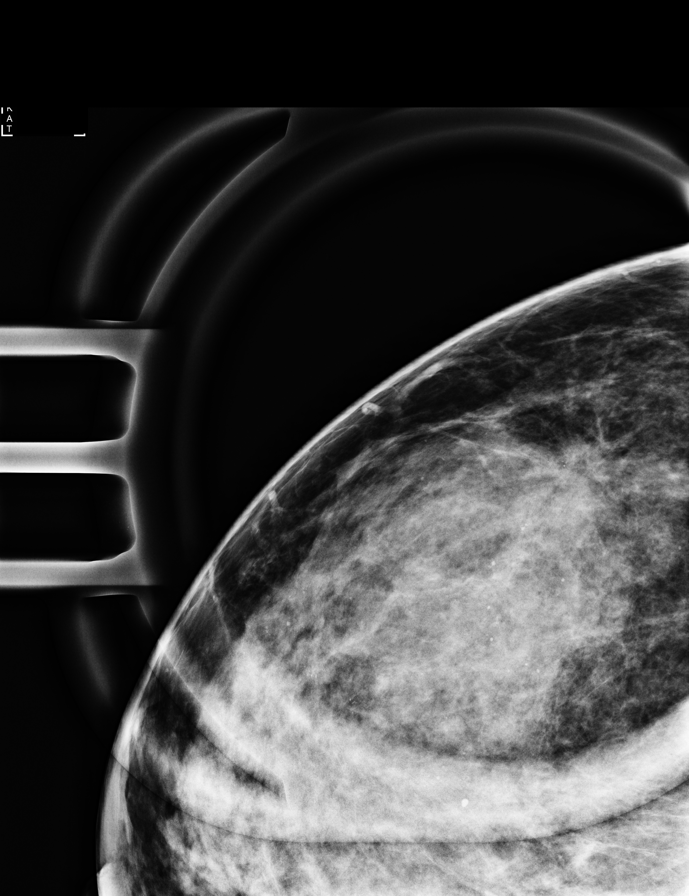

[R ML (2 of 2)]
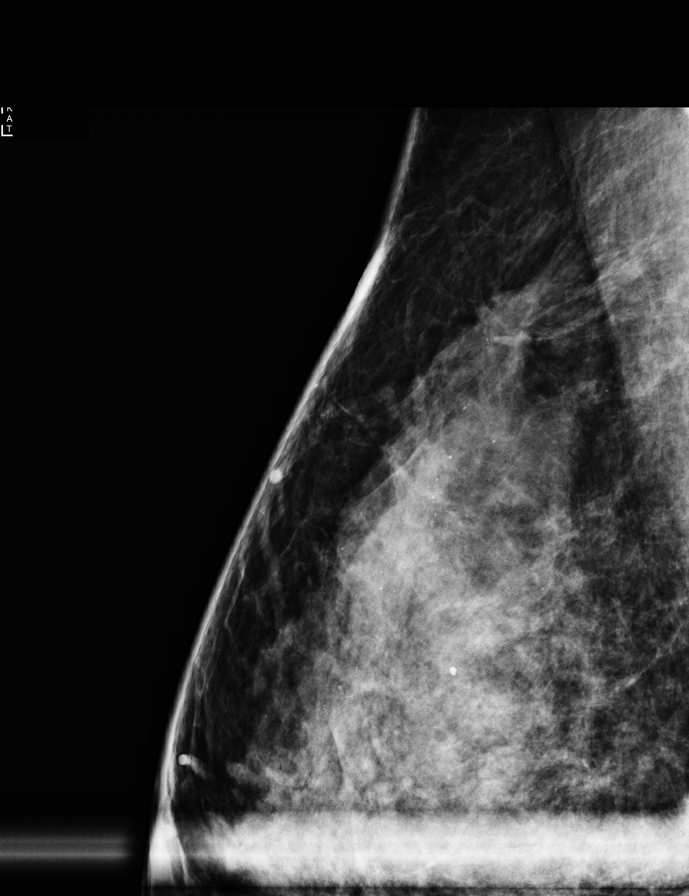

[R ML synth-2D]
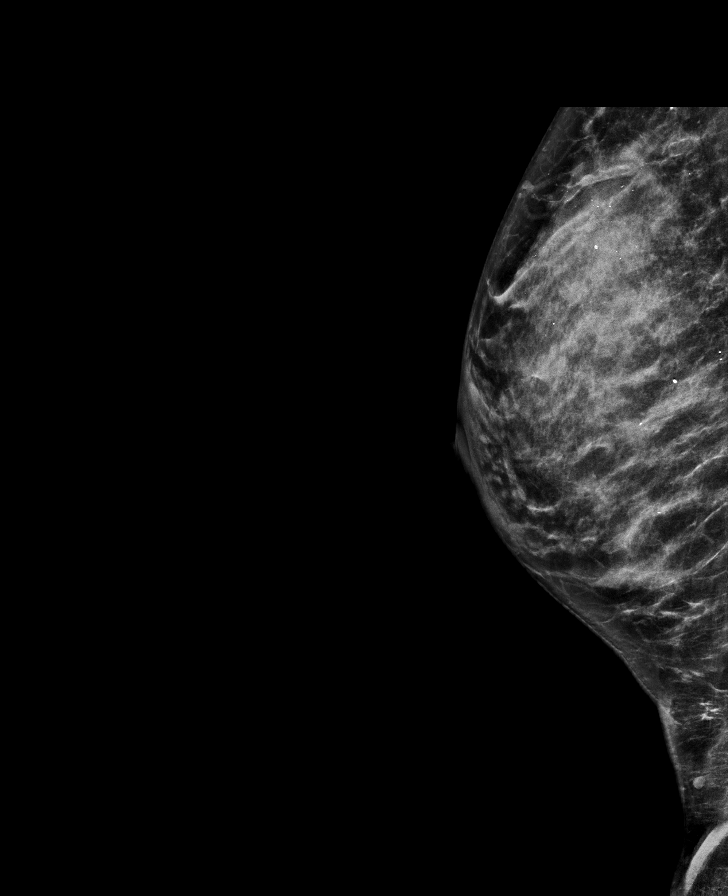

[R MLO synth-2D]
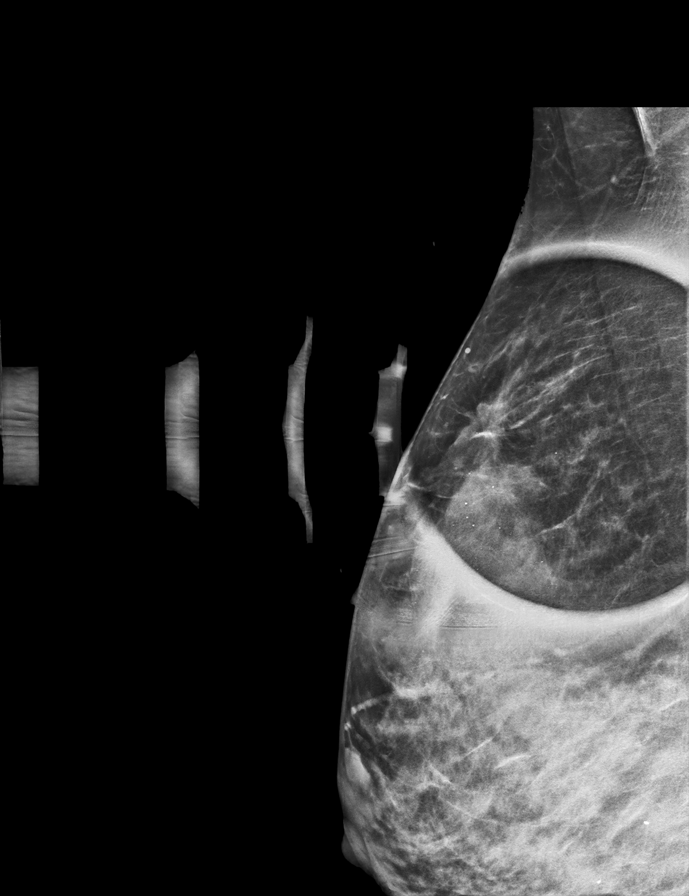

[R CC synth-2D]
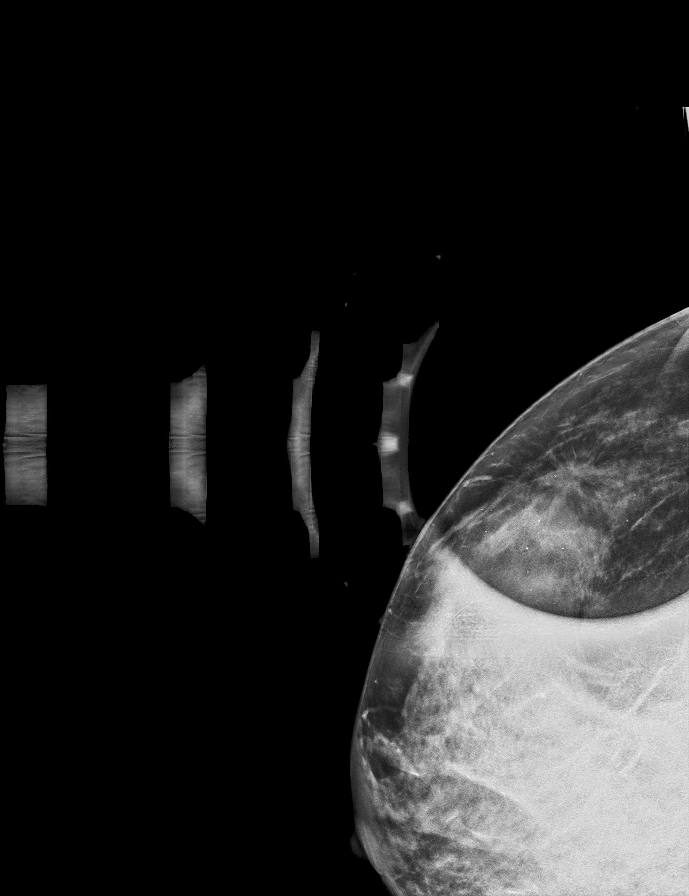

[R ML tomo · tomo slice 35/69.0]
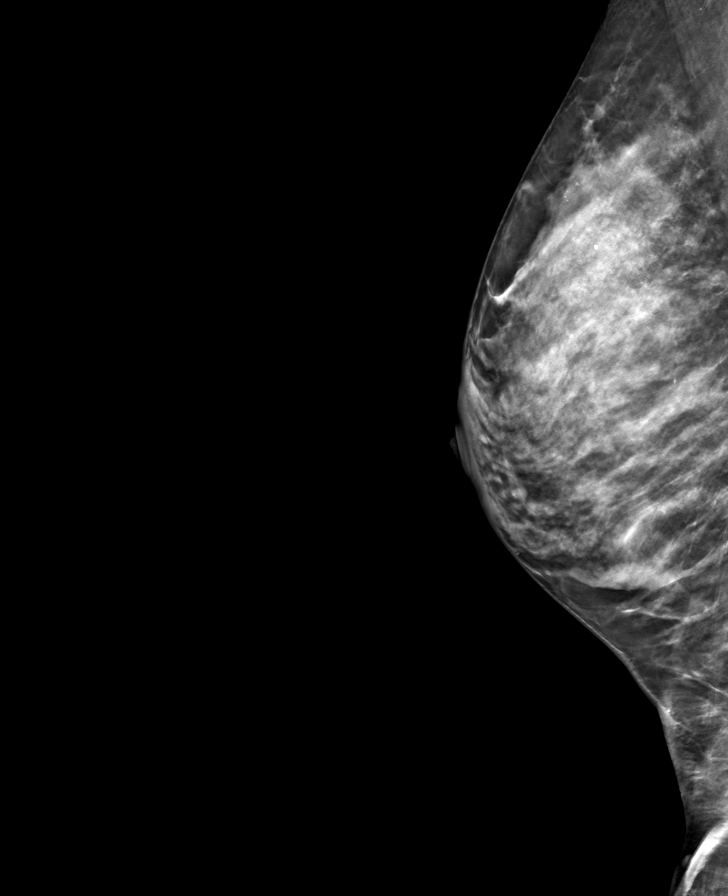

[8 of 22 positions shown; findings below may reference images not displayed]

ACR Breast Density Category d: The breast tissue is extremely dense,
which lowers the sensitivity of mammography.
FINDINGS: Spot compression tomosynthesis images through the upper outer
posterior right breast demonstrates a persistent suspicious area of
distortion. There are round and amorphous calcifications extending
medial and inferior to the region of distortion spanning about 7 cm.

Ultrasound targeted to the right breast at 10 o'clock, 9 cm from the
nipple demonstrates an irregular hypoechoic shadowing mass measuring
approximately 1.6 x 0.9 x 1.2 cm.

Ultrasound of the right axilla demonstrates multiple
normal-appearing lymph nodes.
IMPRESSION: 1. There is a suspicious 1.6 cm mass in the right breast at 10
o'clock with associated distortion.

2. There is a 7 cm span of indeterminate calcifications in the
upper-outer quadrant of the right breast.

3.  No evidence of right axillary lymphadenopathy.

RECOMMENDATION:
1. Ultrasound-guided biopsy is recommended for the right breast mass
at 10 o'clock.

2. Stereotactic biopsy is recommended for a group of distinct
calcifications in the upper-outer right breast. The procedures have
been scheduled for [DATE] at [DATE] a.m.

3. If malignancy is identified, MRI is recommended for this patient
given her age, breast tissue density and extent of imaging findings.

I have discussed the findings and recommendations with the patient.
If applicable, a reminder letter will be sent to the patient
regarding the next appointment.

BI-RADS CATEGORY  5: Highly suggestive of malignancy.

## 2021-04-13 IMAGING — US US BREAST*R* LIMITED INC AXILLA
1 series · 11 of 11 positions shown · non-contrast
Comparison: Previous exam(s).

CLINICAL DATA: 40-year-old female presenting for recall from
screening mammogram for a right breast distortion and
calcifications.

EXAM:
DIGITAL DIAGNOSTIC UNILATERAL RIGHT MAMMOGRAM WITH TOMOSYNTHESIS AND
CAD; ULTRASOUND RIGHT BREAST LIMITED
TECHNIQUE: Right digital diagnostic mammography and breast tomosynthesis was
performed. The images were evaluated with computer-aided detection.;
Targeted ultrasound examination of the right breast was performed

[Series 1: us breast*right* limited inc axilla · 0.06mm/px · 11 of 11 slices shown]
[im 1/11]
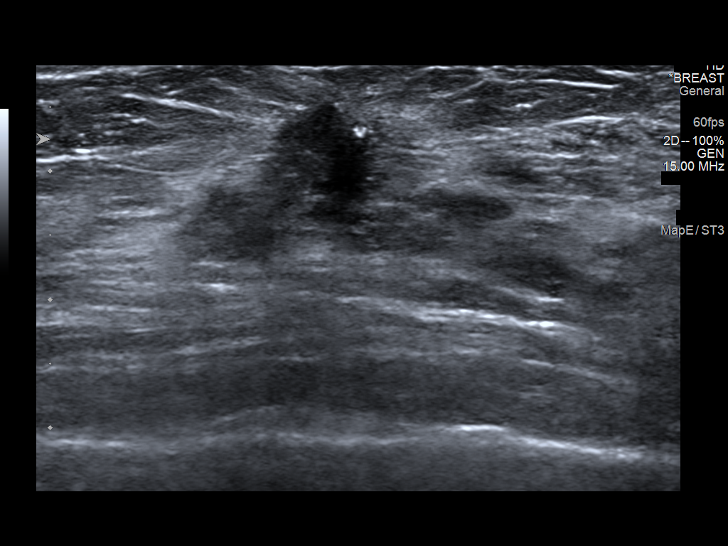
[im 2/11]
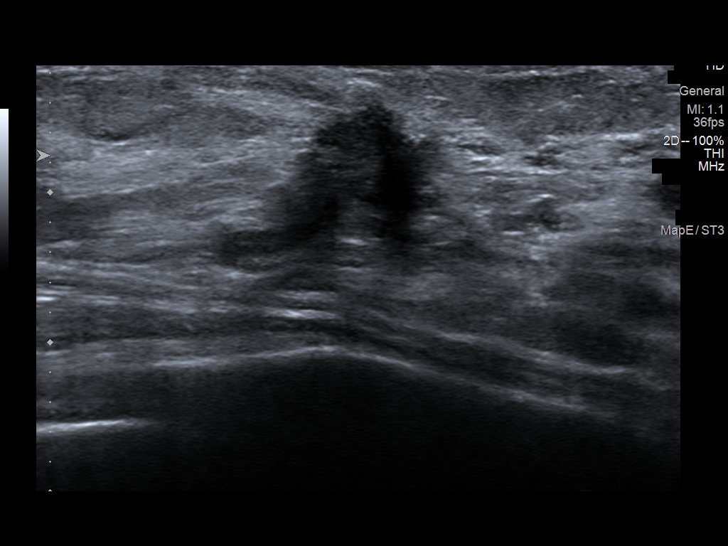
[im 3/11]
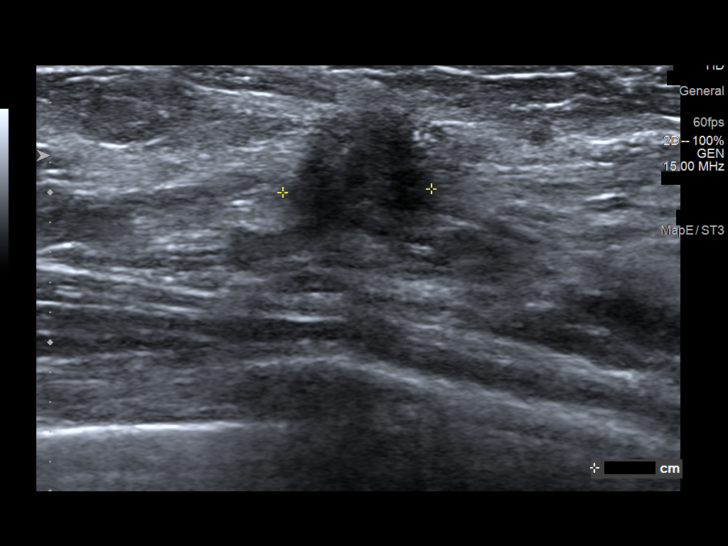
[im 4/11]
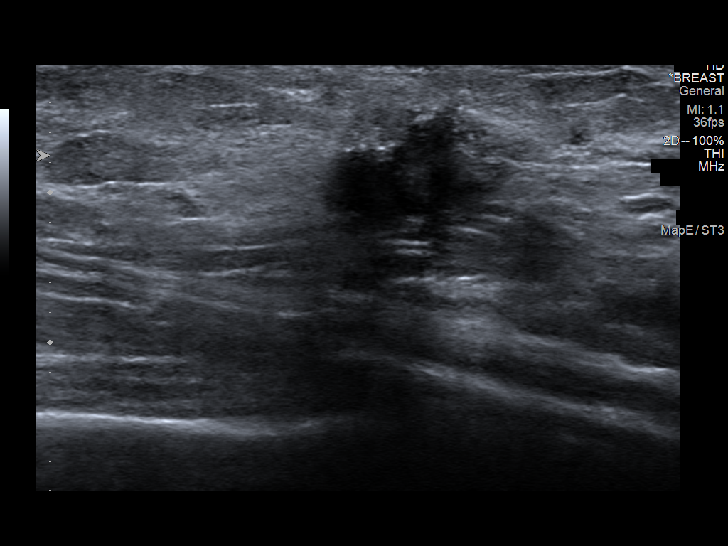
[im 5/11]
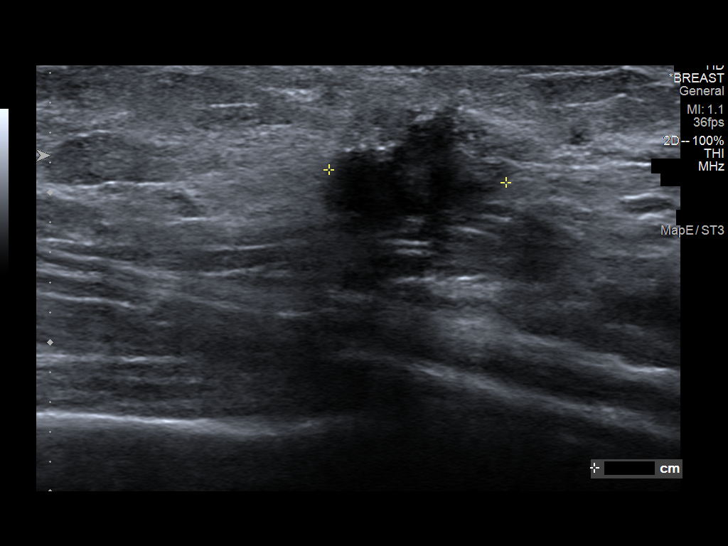
[im 6/11]
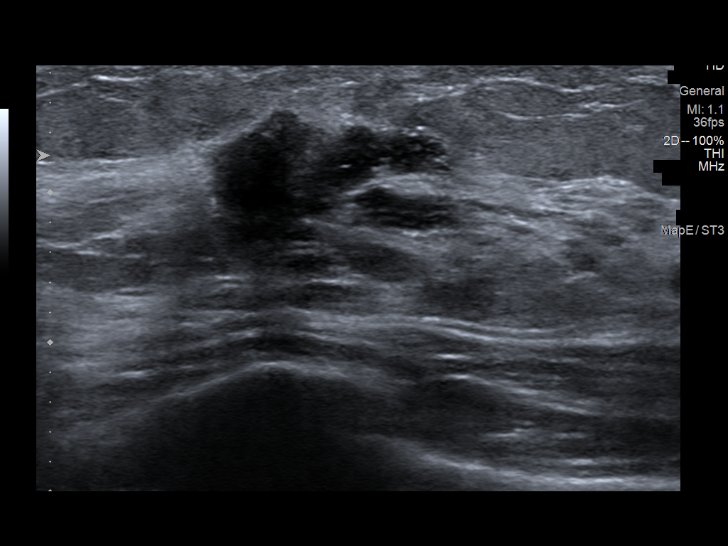
[im 7/11]
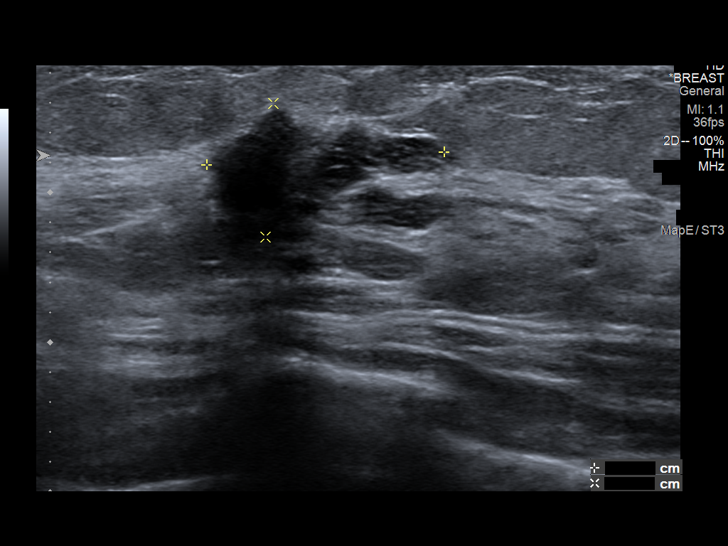
[im 8/11]
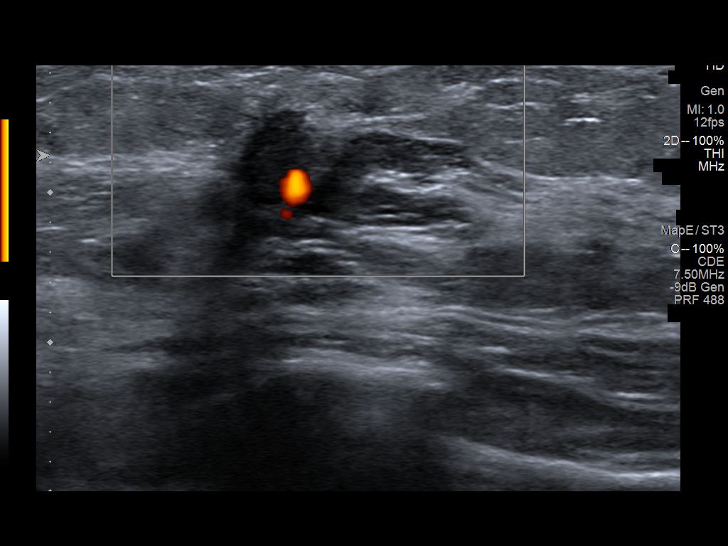
[im 9/11]
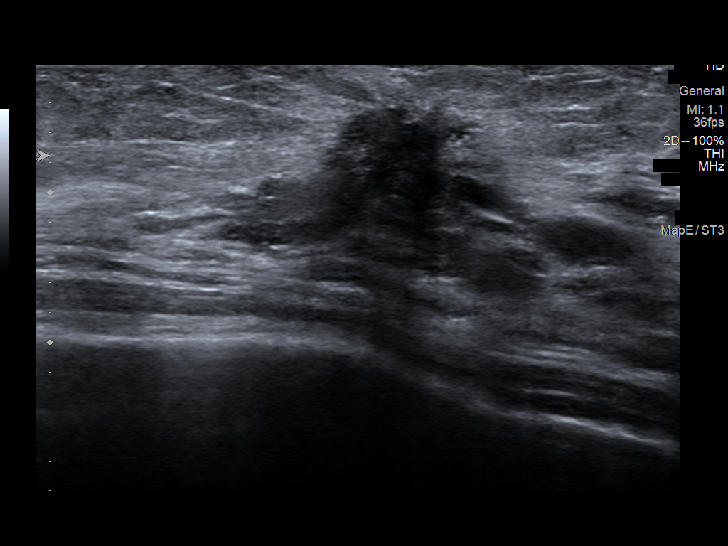
[im 10/11]
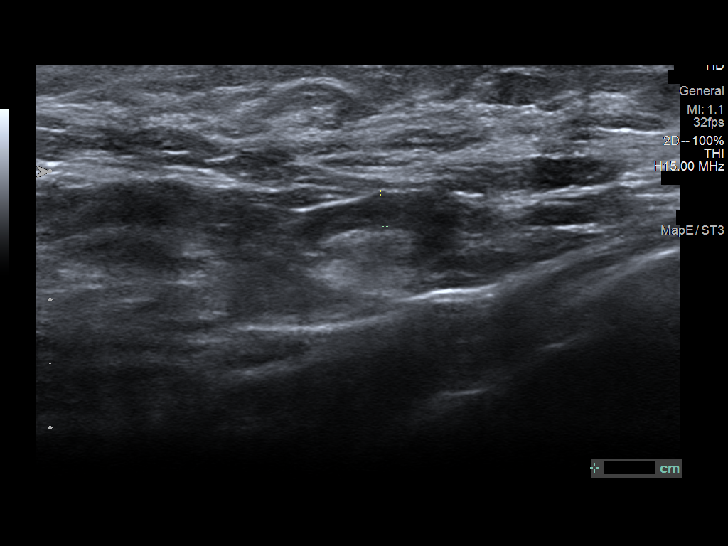
[im 11/11]
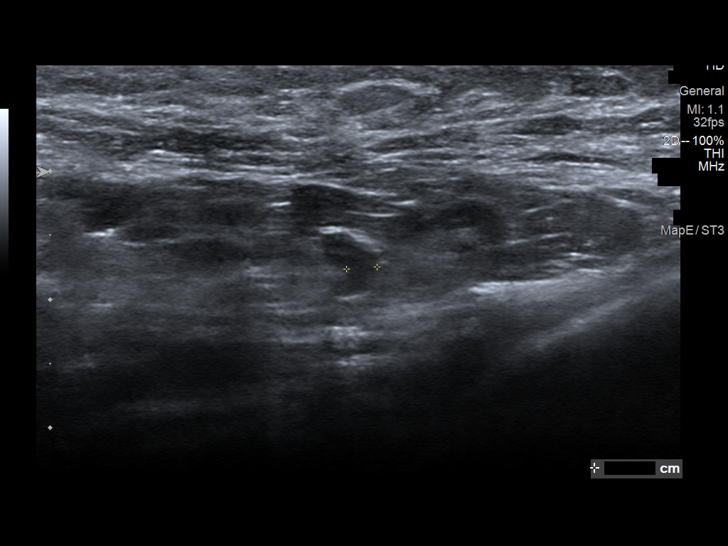

[11 of 11 positions shown; findings below may reference images not displayed]

ACR Breast Density Category d: The breast tissue is extremely dense,
which lowers the sensitivity of mammography.
FINDINGS: Spot compression tomosynthesis images through the upper outer
posterior right breast demonstrates a persistent suspicious area of
distortion. There are round and amorphous calcifications extending
medial and inferior to the region of distortion spanning about 7 cm.

Ultrasound targeted to the right breast at 10 o'clock, 9 cm from the
nipple demonstrates an irregular hypoechoic shadowing mass measuring
approximately 1.6 x 0.9 x 1.2 cm.

Ultrasound of the right axilla demonstrates multiple
normal-appearing lymph nodes.
IMPRESSION: 1. There is a suspicious 1.6 cm mass in the right breast at 10
o'clock with associated distortion.

2. There is a 7 cm span of indeterminate calcifications in the
upper-outer quadrant of the right breast.

3.  No evidence of right axillary lymphadenopathy.

RECOMMENDATION:
1. Ultrasound-guided biopsy is recommended for the right breast mass
at 10 o'clock.

2. Stereotactic biopsy is recommended for a group of distinct
calcifications in the upper-outer right breast. The procedures have
been scheduled for [DATE] at [DATE] a.m.

3. If malignancy is identified, MRI is recommended for this patient
given her age, breast tissue density and extent of imaging findings.

I have discussed the findings and recommendations with the patient.
If applicable, a reminder letter will be sent to the patient
regarding the next appointment.

BI-RADS CATEGORY  5: Highly suggestive of malignancy.

## 2021-04-24 ENCOUNTER — Ambulatory Visit
Admission: RE | Admit: 2021-04-24 | Discharge: 2021-04-24 | Disposition: A | Discharge status: Home / Self Care | Payer: 59 | Source: Ambulatory Visit | Attending: Obstetrics and Gynecology | Admitting: Obstetrics and Gynecology

## 2021-04-24 DIAGNOSIS — N6310 Unspecified lump in the right breast, unspecified quadrant: Secondary | ICD-10-CM

## 2021-04-24 DIAGNOSIS — R921 Mammographic calcification found on diagnostic imaging of breast: Secondary | ICD-10-CM

## 2021-04-24 HISTORY — PX: BREAST BIOPSY: SHX20

## 2021-04-24 IMAGING — US US  BREAST BX W/ LOC DEV 1ST LESION IMG BX SPEC US GUIDE*R*
1 series · 12 of 12 positions shown · non-contrast
Comparison: Previous exam(s).
COMPARISON: Previous exam(s).

Addendum:
CLINICAL DATA: Patient with suspicious right breast mass 10 o'clock
position.

EXAM:
ULTRASOUND GUIDED RIGHT BREAST CORE NEEDLE BIOPSY

[Series 2: us breast bx w/ loc dev 1st lesion img bx spec us  · 0.06mm/px · 12 of 12 slices shown]
[im 1/12]
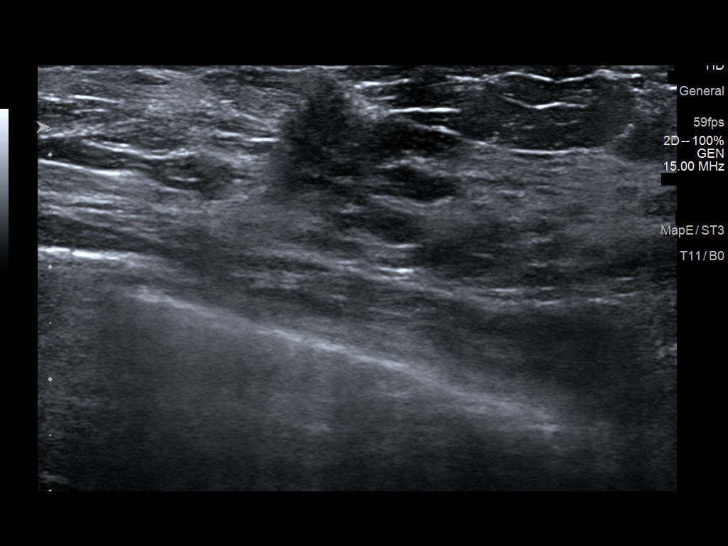
[im 2/12]
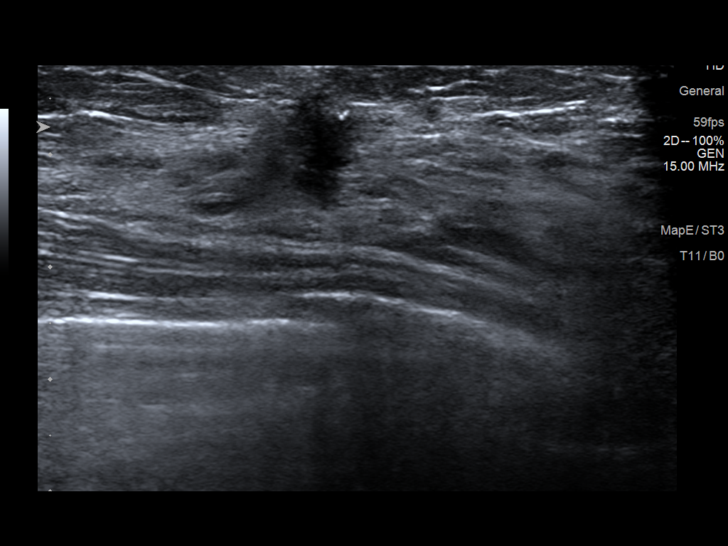
[im 3/12]
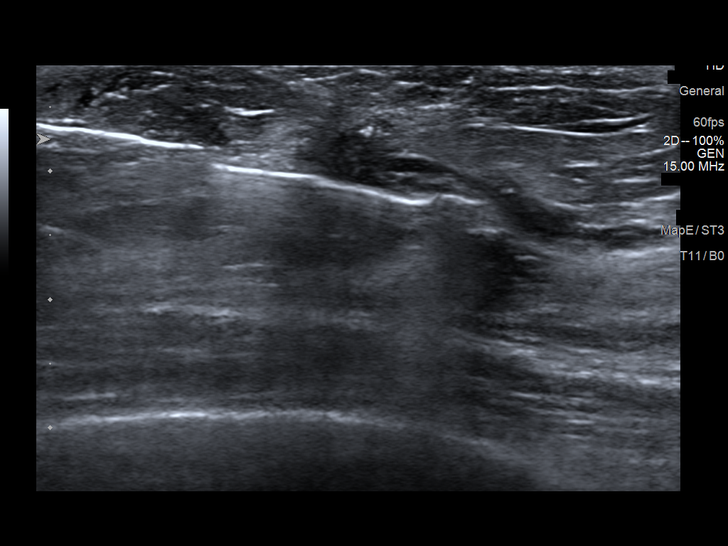
[im 4/12]
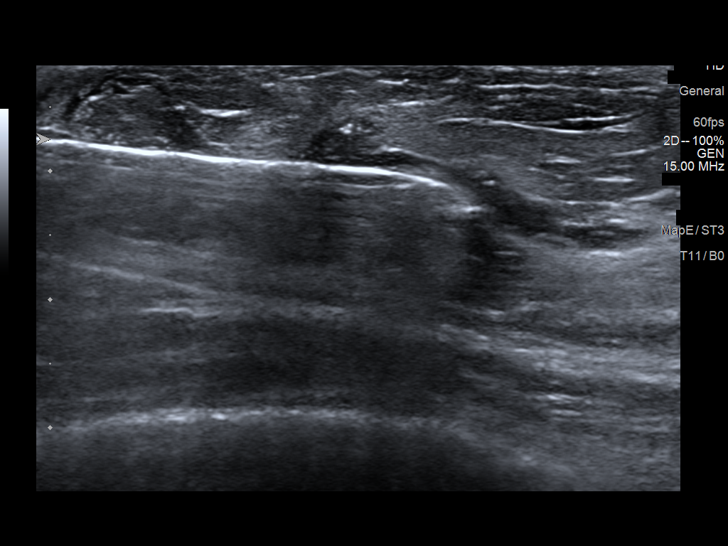
[im 5/12]
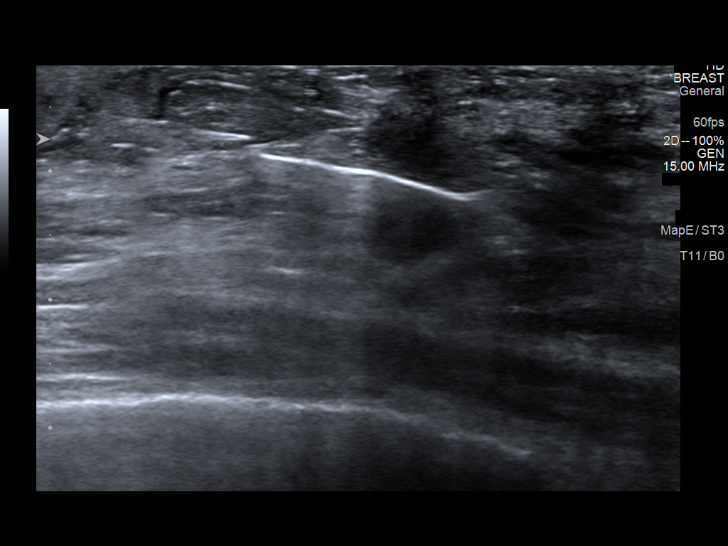
[im 6/12]
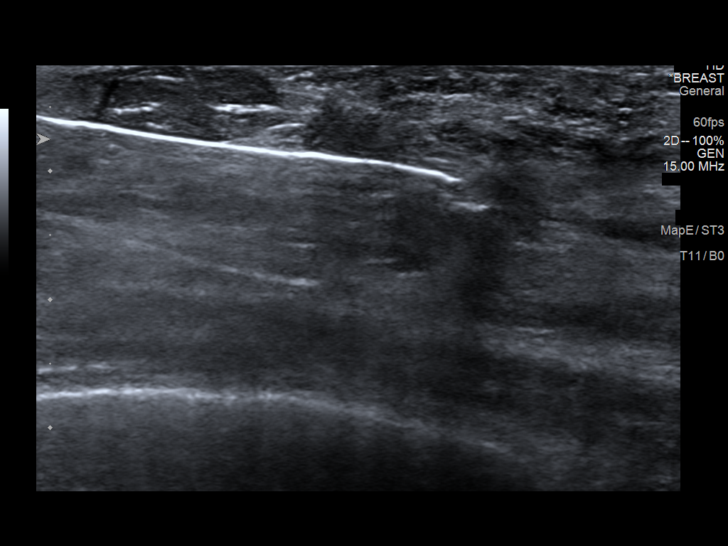
[im 7/12]
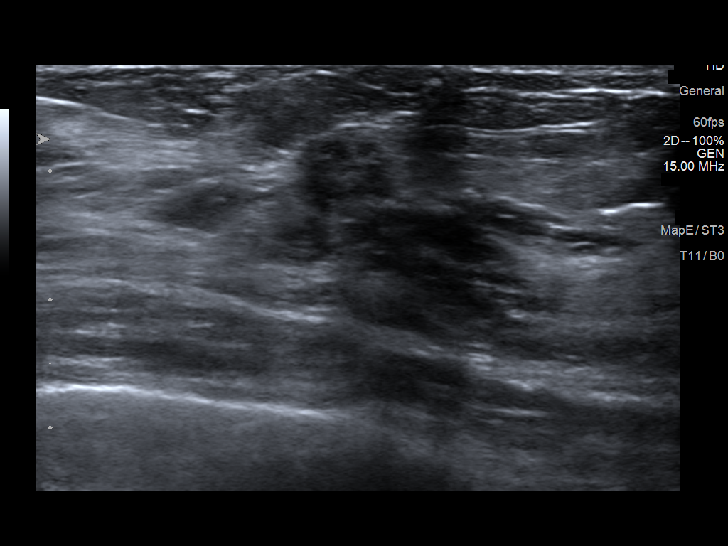
[im 8/12]
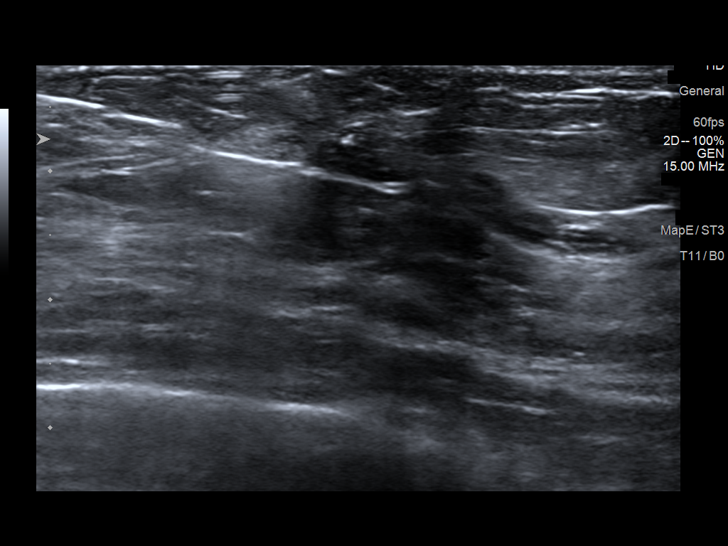
[im 9/12]
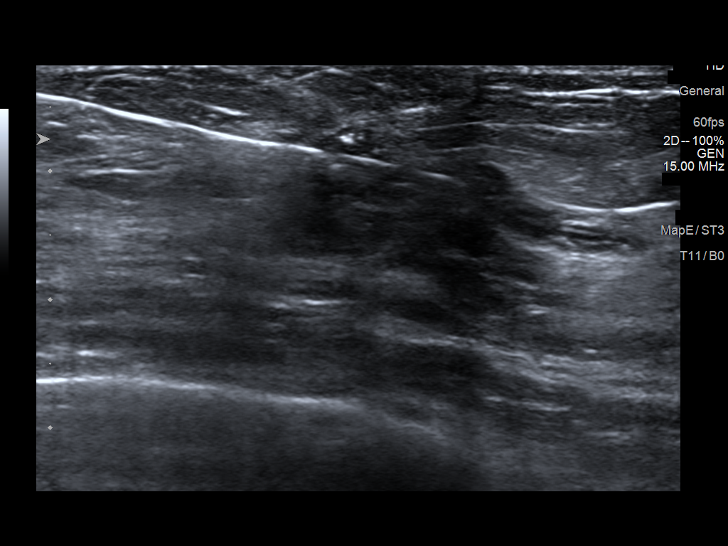
[im 10/12]
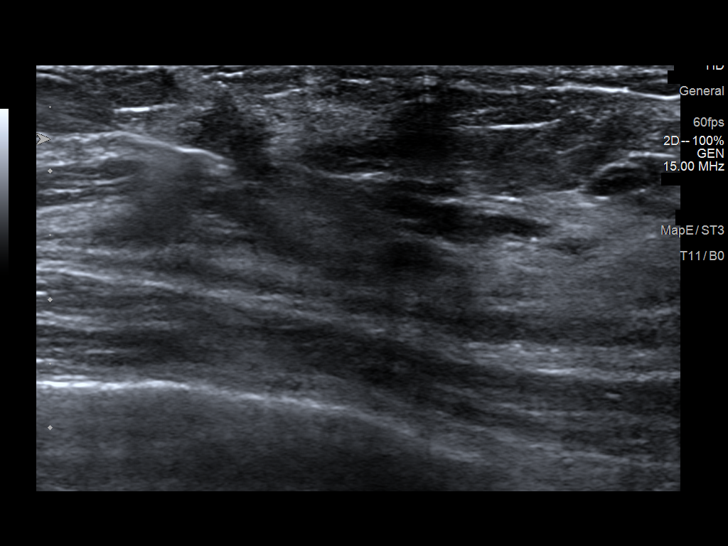
[im 11/12]
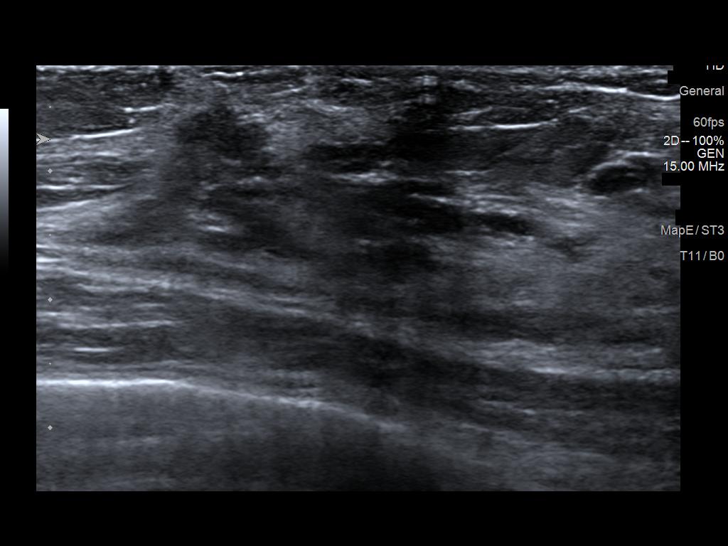
[im 12/12]
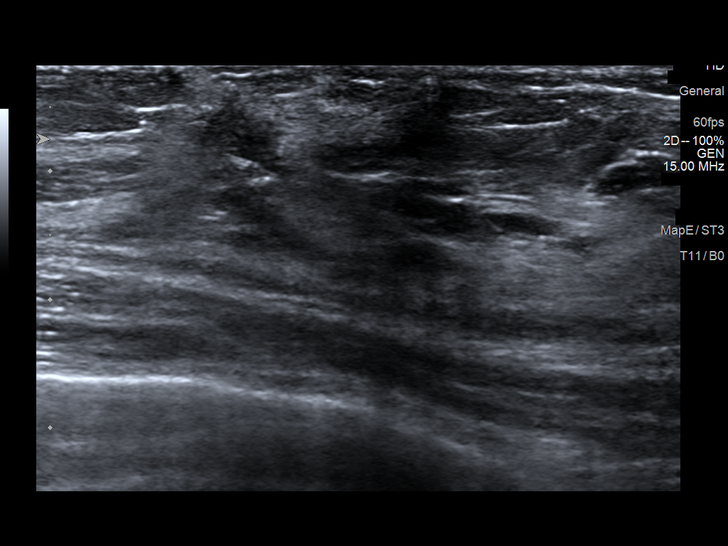

[12 of 12 positions shown; findings below may reference images not displayed]



Lesion quadrant: Upper outer quadrant

Using sterile technique and 1% Lidocaine as local anesthetic, under
direct ultrasound visualization, a 14 gauge SANCHO device was
used to perform biopsy of right breast mass 10 o'clock position
using a lateral approach. At the conclusion of the procedure ribbon
tissue marker clip was deployed into the biopsy cavity. Follow up 2
view mammogram was performed and dictated separately.
IMPRESSION: Ultrasound guided biopsy of right breast mass 10 o'clock. No
apparent complications.

ADDENDUM:
Pathology revealed INTERMEDIATE GRADE DUCTAL CARCINOMA IN SITU WITH
MICROCALCIFICATIONS of the RIGHT breast, upper outer, (x clip). This
was found to be concordant by Dr. SANCHO.

Pathology revealed GRADE II INVASIVE DUCTAL CARCINOMA, DUCTAL
CARCINOMA IN SITU of the RIGHT breast, 10 o'clock, (ribbon clip).
This was found to be concordant by Dr. SANCHO.

Pathology results were discussed with the patient and her SANCHO by telephone, per request. The patient reported doing well
after the biopsies with tenderness at the sites. Post biopsy
instructions and care were reviewed and questions were answered. The
patient was encouraged to call [REDACTED] for any additional concerns. My direct phone number was
provided.

The patient was referred to [REDACTED]
[REDACTED] at [REDACTED] on
[DATE].

Given the breast density as well as scattered calcifications in the
outer right breast, breast MRI is needed for extent of disease
assessment.

Pathology results reported by SANCHO, RN on [DATE].



Lesion quadrant: Upper outer quadrant

Using sterile technique and 1% Lidocaine as local anesthetic, under
direct ultrasound visualization, a 14 gauge SANCHO device was
used to perform biopsy of right breast mass 10 o'clock position
using a lateral approach. At the conclusion of the procedure ribbon
tissue marker clip was deployed into the biopsy cavity. Follow up 2
view mammogram was performed and dictated separately.
IMPRESSION: Ultrasound guided biopsy of right breast mass 10 o'clock. No
apparent complications.

## 2021-04-24 IMAGING — MG MM BREAST BX W/ LOC DEV 1ST LESION IMAGE BX SPEC STEREO GUIDE*R*
8 of 9 series · 8 of 17 positions shown · non-contrast
Comparison: Previous exams.
COMPARISON: Previous exams.

Addendum:
CLINICAL DATA: Patient with suspicious right breast calcifications.

EXAM:
RIGHT BREAST STEREOTACTIC CORE NEEDLE BIOPSY

[R (1 of 6)]
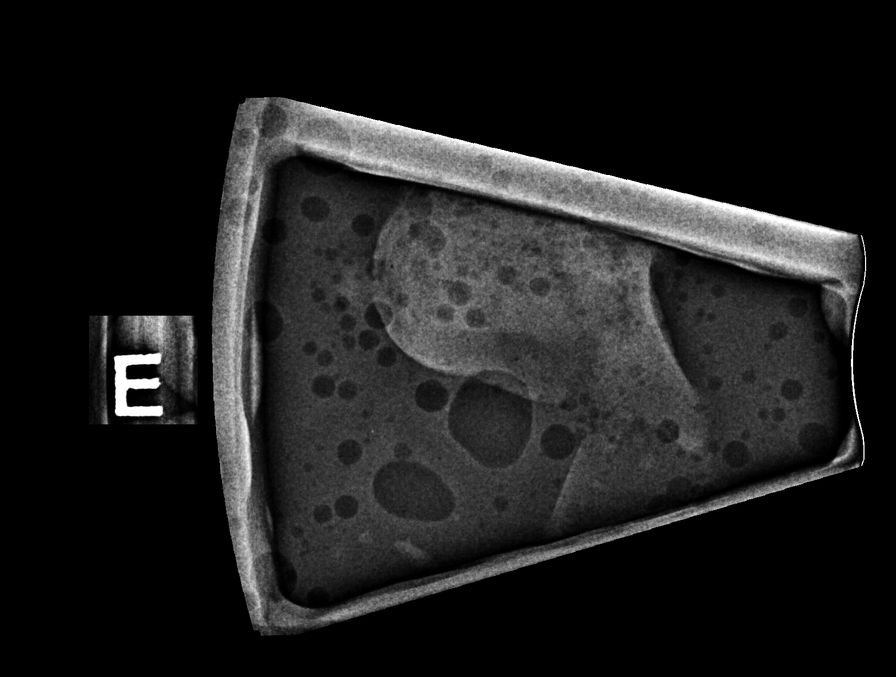

[R (2 of 6)]
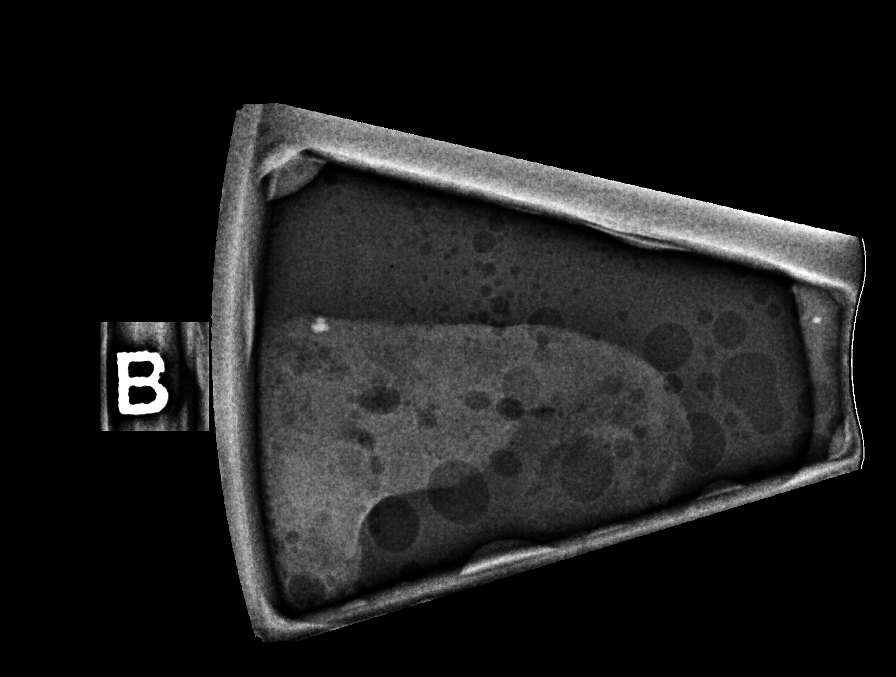

[R (3 of 6)]
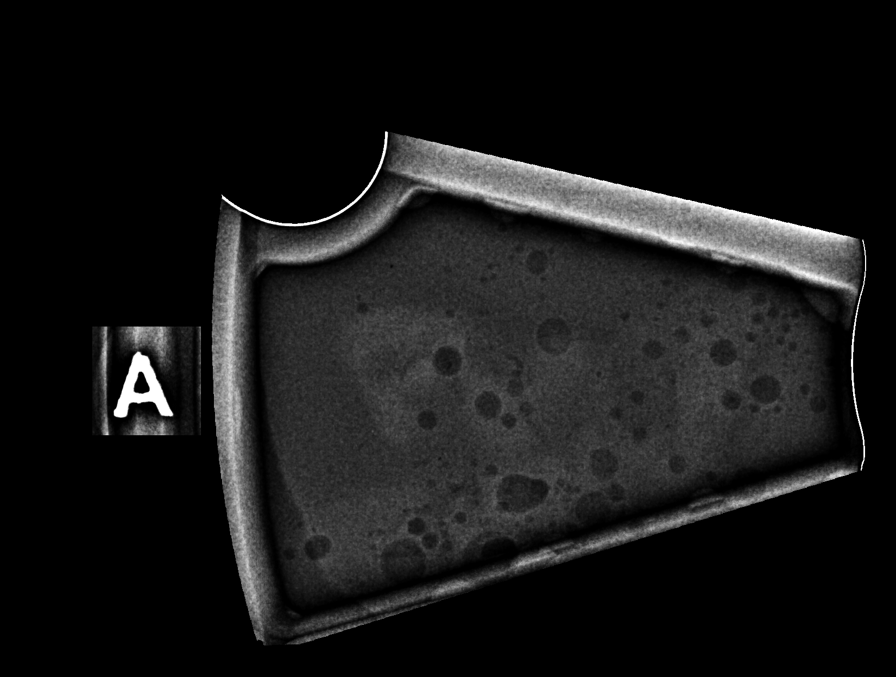

[R (4 of 6)]
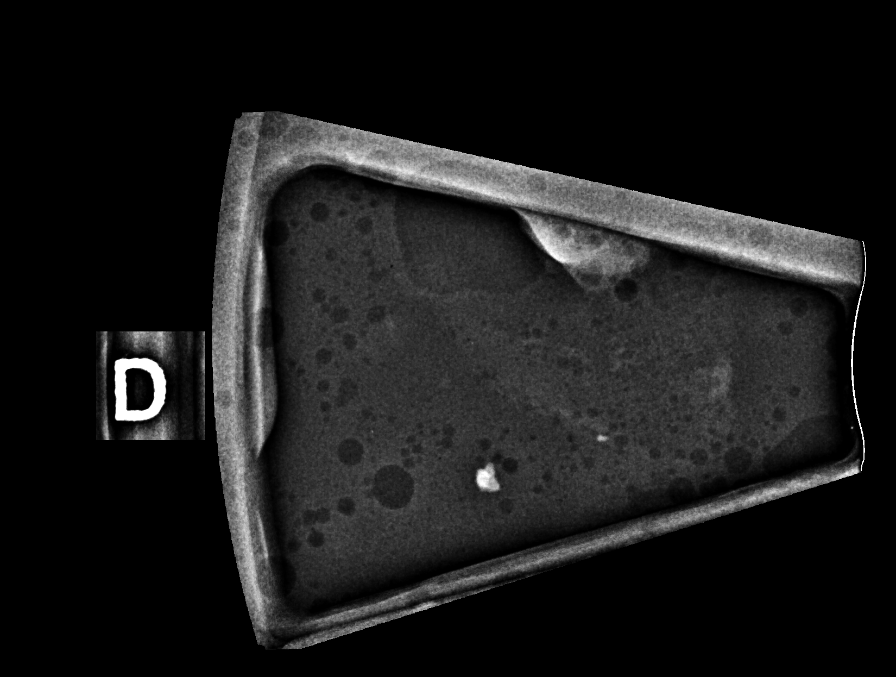

[R (5 of 6)]
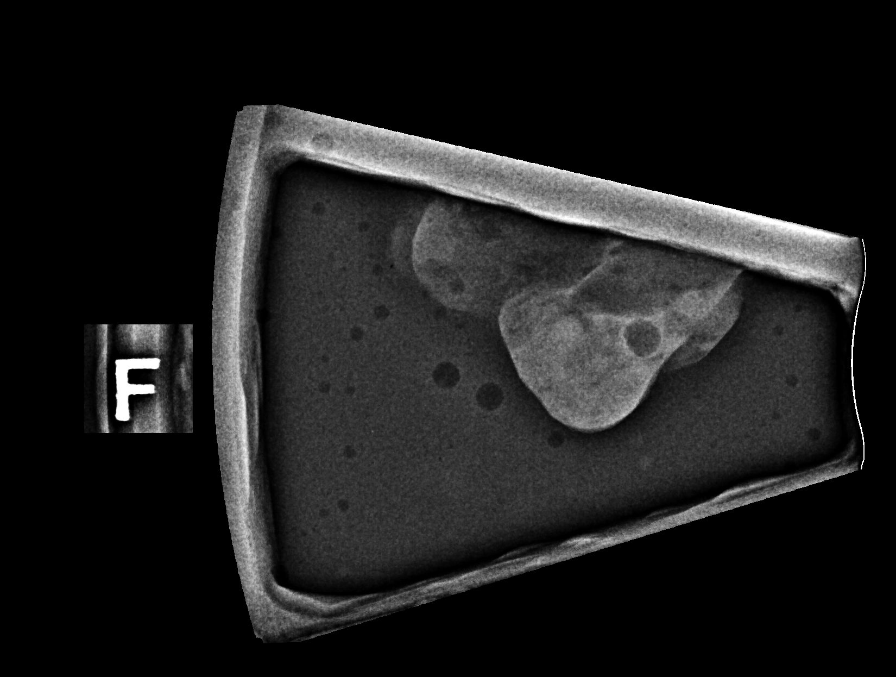

[R (6 of 6)]
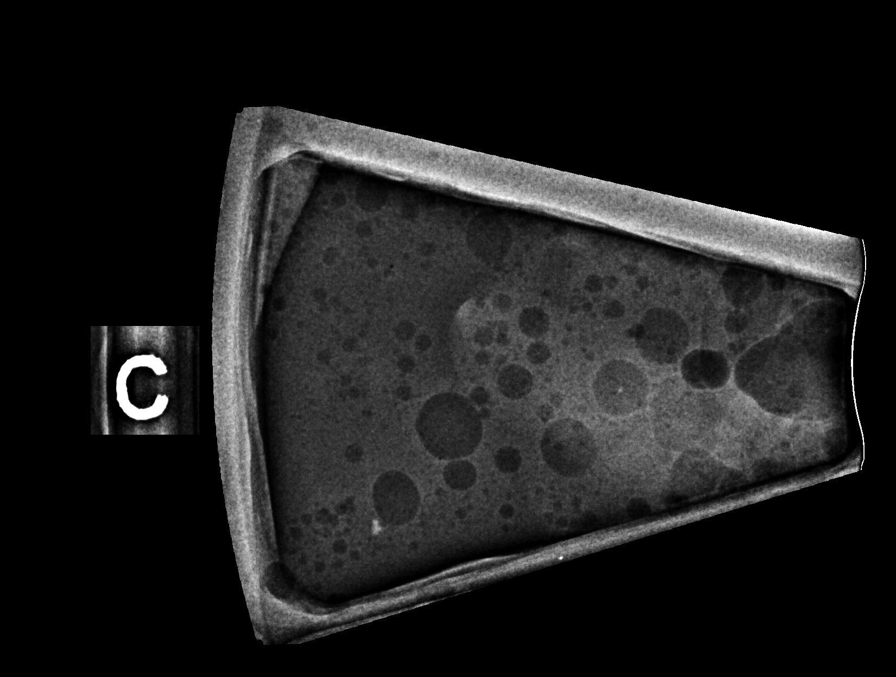

[R CC]
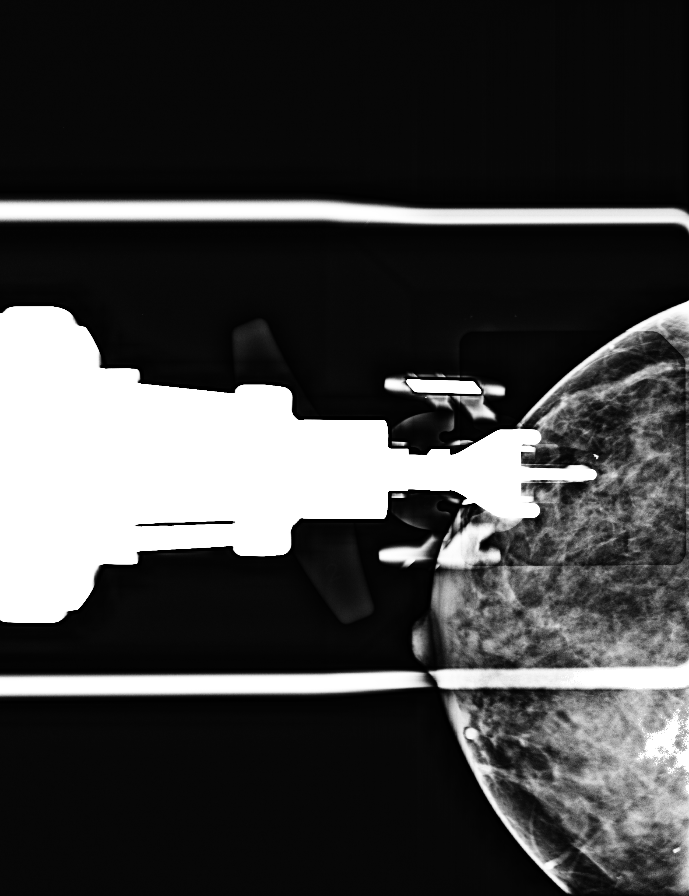

[R CC tomo · tomo slice 29/58.0]
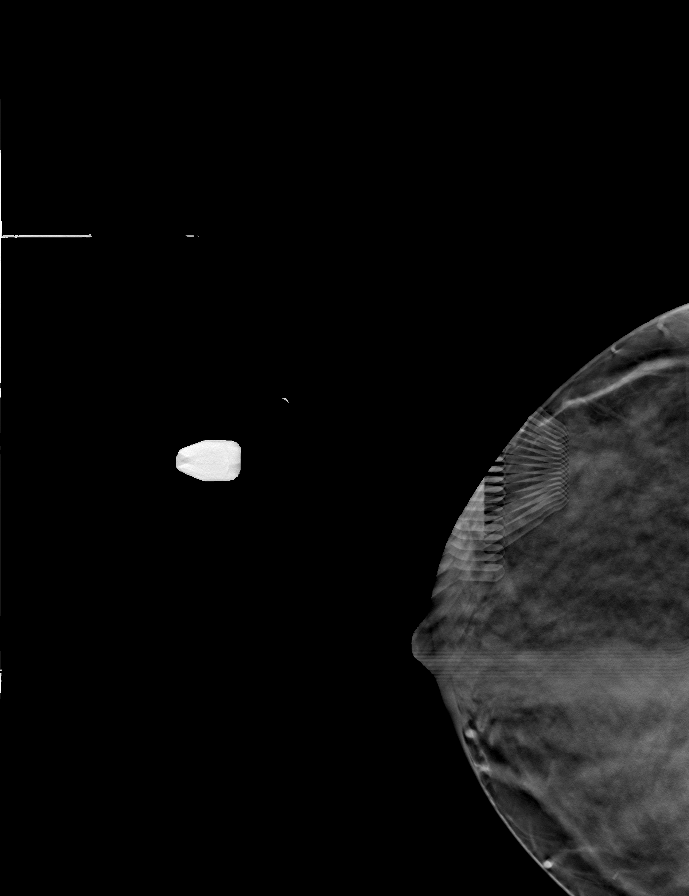

[8 of 17 positions shown; findings below may reference images not displayed]



Using sterile technique and 1% Lidocaine as local anesthetic, under
stereotactic guidance, a 9 gauge vacuum assisted device was used to
perform core needle biopsy of calcifications upper-outer right
breast using a cranial approach. Specimen radiograph was performed
showing calcifications. Specimens with calcifications are identified
for pathology.

Lesion quadrant: Upper outer quadrant

At the conclusion of the procedure, X shaped tissue marker clip was
deployed into the biopsy cavity. Follow-up 2-view mammogram was
performed and dictated separately.
IMPRESSION: Stereotactic-guided biopsy of right breast calcifications. No
apparent complications.

ADDENDUM:
Pathology revealed INTERMEDIATE GRADE DUCTAL CARCINOMA IN SITU WITH
MICROCALCIFICATIONS of the RIGHT breast, upper outer, (x clip). This
was found to be concordant by Dr. MAYDHAX.

Pathology revealed GRADE II INVASIVE DUCTAL CARCINOMA, DUCTAL
CARCINOMA IN SITU of the RIGHT breast, 10 o'clock, (ribbon clip).
This was found to be concordant by Dr. MAYDHAX.

Pathology results were discussed with the patient and her MAYDHAX by telephone, per request. The patient reported doing well
after the biopsies with tenderness at the sites. Post biopsy
instructions and care were reviewed and questions were answered. The
patient was encouraged to call [REDACTED] for any additional concerns. My direct phone number was
provided.

The patient was referred to [REDACTED]
[REDACTED] at [REDACTED] on
[DATE].

MRI is recommended for this patient given her age, breast tissue
density and extent of imaging findings.

Pathology results reported by MAYDHAX, RN on [DATE].



Using sterile technique and 1% Lidocaine as local anesthetic, under
stereotactic guidance, a 9 gauge vacuum assisted device was used to
perform core needle biopsy of calcifications upper-outer right
breast using a cranial approach. Specimen radiograph was performed
showing calcifications. Specimens with calcifications are identified
for pathology.

Lesion quadrant: Upper outer quadrant

At the conclusion of the procedure, X shaped tissue marker clip was
deployed into the biopsy cavity. Follow-up 2-view mammogram was
performed and dictated separately.
IMPRESSION: Stereotactic-guided biopsy of right breast calcifications. No
apparent complications.

## 2021-04-24 IMAGING — MG MM BREAST LOCALIZATION CLIP
4 series · 4 of 12 positions shown · non-contrast
Comparison: Previous exam(s).

CLINICAL DATA: Patient status post ultrasound-guided biopsy right
breast mass and stereotactic guided biopsy right breast
calcifications.

EXAM:
3D DIAGNOSTIC RIGHT MAMMOGRAM POST ULTRASOUND AND STEREOTACTIC
BIOPSIES

[R ML synth-2D]
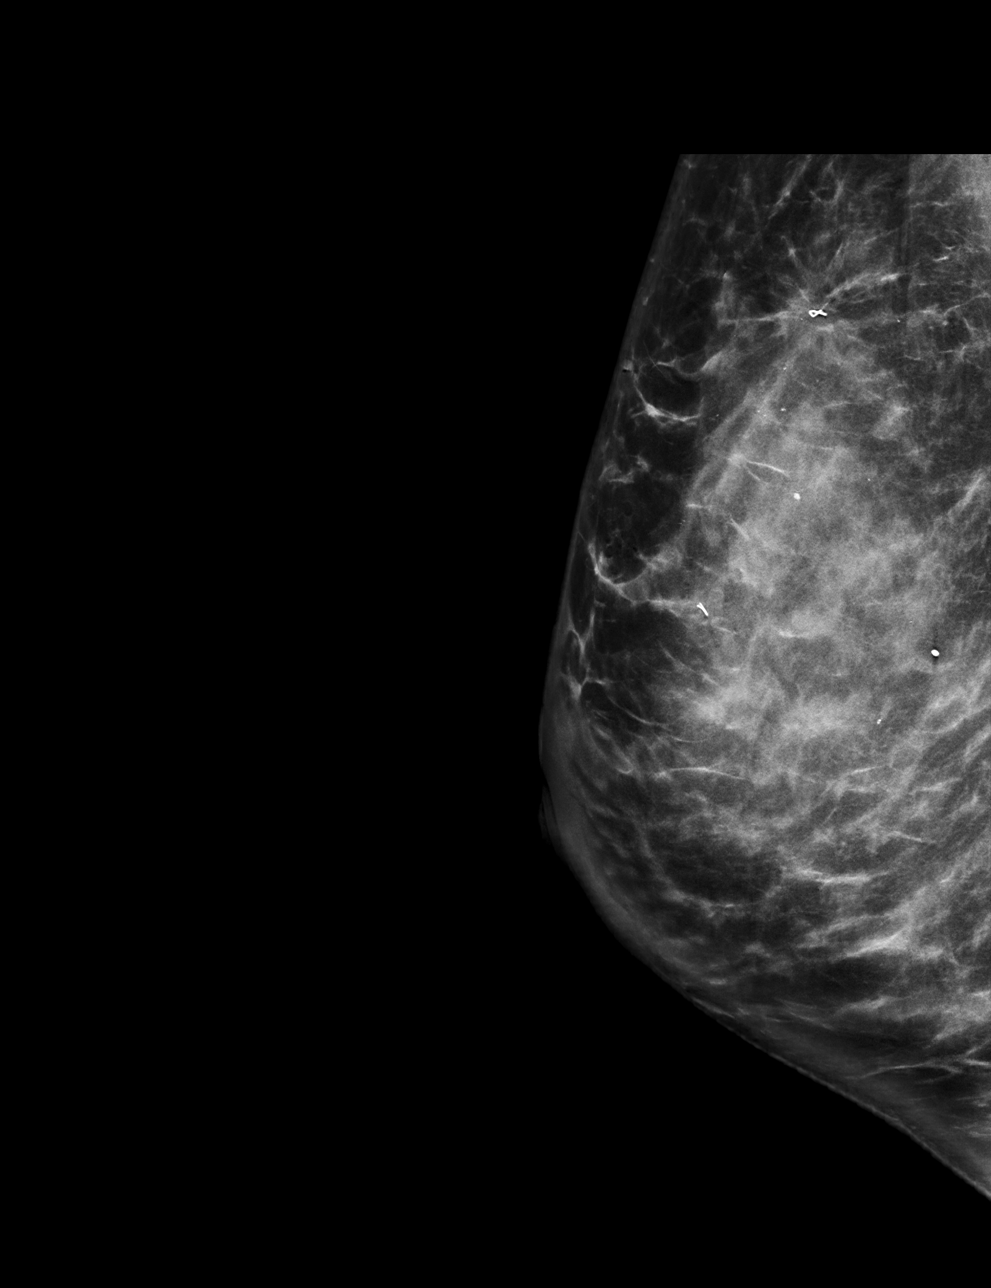

[R CC synth-2D]
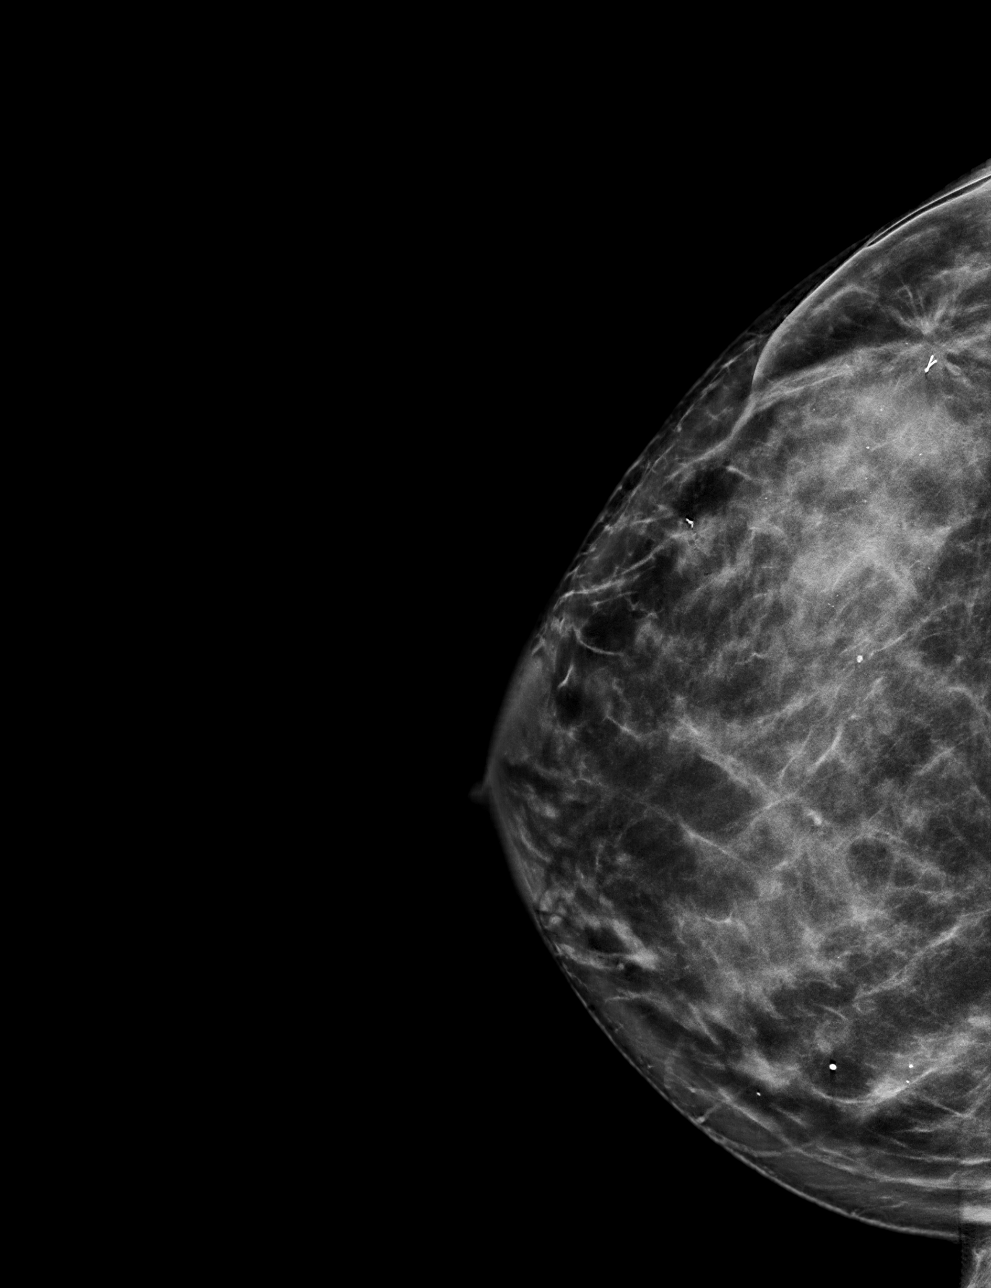

[R ML tomo · tomo slice 41/81.0]
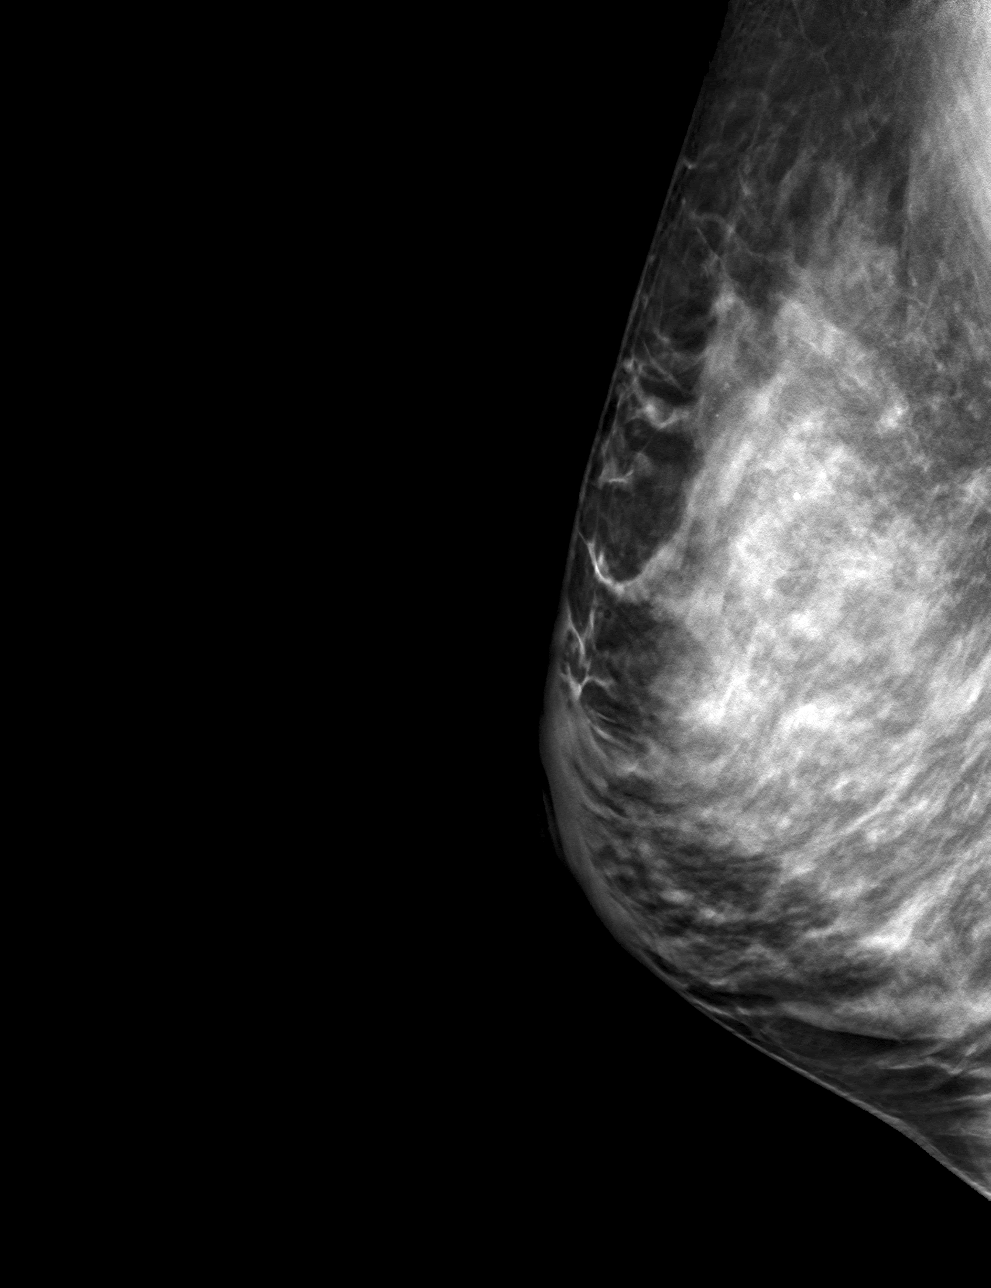

[R CC tomo · tomo slice 43/85.0]
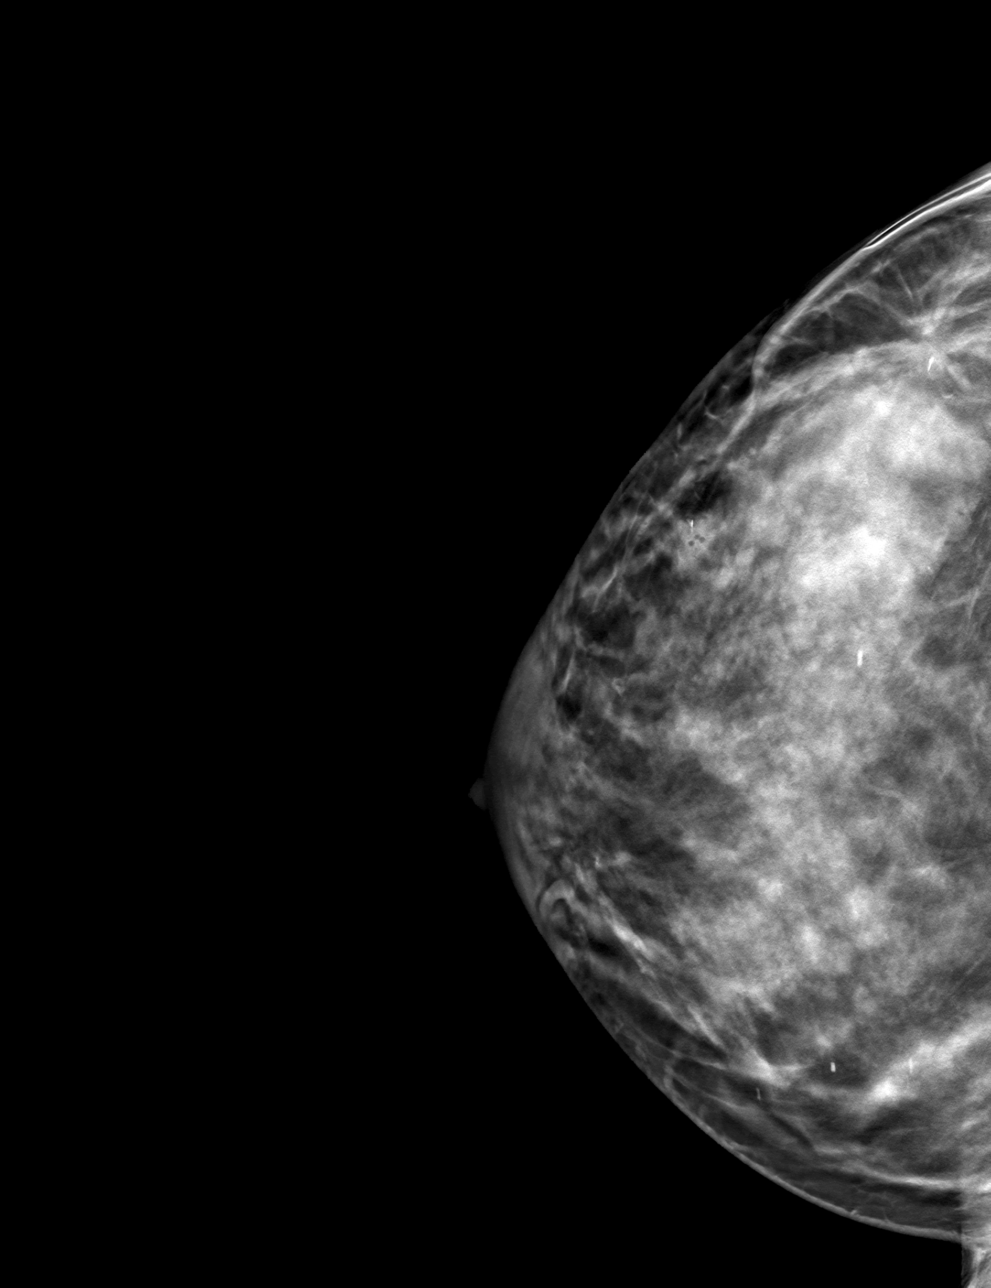

[4 of 12 positions shown; findings below may reference images not displayed]

FINDINGS: 3D Mammographic images were obtained following ULTRASOUND AND
STEREOTACTIC guided biopsies.

Site 1: Right breast calcifications: X shaped clip: In appropriate
position.

Site 2: Right breast mass 10 o'clock position: Ribbon shaped clip:
Appropriate position.
IMPRESSION: Appropriate positioning of the biopsy marking clips as above.

Final Assessment: Post Procedure Mammograms for Marker Placement

## 2021-04-27 ENCOUNTER — Other Ambulatory Visit: Payer: 59

## 2021-04-28 ENCOUNTER — Telehealth: Payer: Self-pay | Admitting: Hematology and Oncology

## 2021-04-28 NOTE — Telephone Encounter (Signed)
Spoke with patient to confirm afternoon appointment for 3/22, packet emailed to patient ?

## 2021-05-01 ENCOUNTER — Encounter: Payer: Self-pay | Admitting: *Deleted

## 2021-05-01 DIAGNOSIS — C50411 Malignant neoplasm of upper-outer quadrant of right female breast: Secondary | ICD-10-CM | POA: Insufficient documentation

## 2021-05-05 NOTE — Progress Notes (Signed)
?Radiation Oncology         [phone] ?________________________________ ? ?Multidisciplinary Breast Oncology Clinic [facility]) ?Initial Outpatient Consultation ? ?Name: Jackie Miller MRN: [id number]  ?Date: 05/06/2021  DOB: 1980/11/05 ? ?[id number], Nino Glow, MD  Jovita Kussmaul, MD  ? ?REFERRING PHYSICIAN: Jovita Kussmaul, MD ? ?DIAGNOSIS: The encounter diagnosis was Malignant neoplasm of upper-outer quadrant of right breast in female, estrogen receptor positive ([place]). ? 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 ?  ICD-10-CM   ?1. Malignant neoplasm of upper-outer quadrant of right breast in female, estrogen receptor positive ([place])  C50.411   ? Z17.0   ?  ? ? ?HISTORY OF PRESENT ILLNESS::Jackie Miller is a 41 y.o. female who is presenting to the office today for evaluation of her newly diagnosed breast cancer. She is accompanied by her cousin and a friend. She is doing well overall.  ? ?She had routine screening mammography on 03/19/21 showing a possible abnormality in the right breast. She underwent unilateral right diagnostic mammography with tomography and right breast ultrasonography at [facility] on 04/13/21 showing: a suspicious 1.6 cm mass in the right breast at the 10 o'clock position with associated distortion, and a 7 cm span of indeterminate calcifications in the upper-outer quadrant of the right breast.  No evidence of right axillary lymphadenopathy was appreciated.  ? ?Biopsy of the upper outer right breast on 04/24/21 showed: intermediate grade DCIS measuring 0.2 cm in the greatest linear extent. Prognostic indicators significant for: estrogen receptor, 95% positive with moderate staining intensity and progesterone receptor, 95% positive with strong staining intensity. HER2 not assessed. ? ?10 o'clock right breast biopsy showed grade 2 invasive ductal carcinoma measuring 0.6 cm in the greatest linear  extent, with DCIS. Prognostic indicators significant for: estrogen receptor 70% positive with moderate staining intensity; progesterone receptor >95% positive with strong staining intensity; Proliferation marker Ki67 at 2; Her2 status negative; Grade 2.  ? ?Menarche: 41 years old ?LMP: still having periods ?Contraceptive: did not indicate on the provided form ?HRT: never used ? ?The patient was referred today for presentation in the multidisciplinary conference.  Radiology studies and pathology slides were presented there for review and discussion of treatment options.  A consensus was discussed regarding potential next steps. ? ?PREVIOUS RADIATION THERAPY: No ? ?PAST MEDICAL HISTORY:  ?Past Medical History:  ?Diagnosis Date  ? Asthma   ? Asthma   ? Bursitis   ? right hip  ? Chicken pox   ? Family history of adverse reaction to anesthesia   ? Father - slow to wake  ? FH: colon cancer   ? mom died age 59 yo  ? Hay fever   ? Headache   ? tension/cluster  ? Jaundice   ? as a baby  ? Motion sickness   ? moving vehicles  ? PCOS (polycystic ovarian syndrome)   ? Wears contact lenses   ? ? ?PAST SURGICAL HISTORY: ?Past Surgical History:  ?Procedure Laterality Date  ? BREAST BIOPSY Right 04/24/2021  ? COLONOSCOPY WITH PROPOFOL N/A 04/10/2019  ? Procedure: COLONOSCOPY WITH PROPOFOL;  Surgeon: Lucilla Lame, MD;  Location: [place];  Service: Endoscopy;  Laterality: N/A;  ? EYE SURGERY Bilateral 02/16/2004  ? lasik's  ? MYRINGOTOMY WITH TUBE PLACEMENT    ? TONSILLECTOMY AND ADENOIDECTOMY    ? ? ?FAMILY HISTORY:  ?Family History  ?Problem Relation Age of  Onset  ? Colon cancer Mother 33  ? Hypertension Father   ? Breast cancer Maternal Grandmother   ? Lung cancer Paternal Grandfather   ? Breast cancer Cousin   ? ? ?SOCIAL HISTORY:  ?Social History  ? ?Socioeconomic History  ? Marital status: Single  ?  Spouse name: Not on file  ? Number of children: Not on file  ? Years of education: Not on file  ? Highest education  level: Not on file  ?Occupational History  ? Not on file  ?Tobacco Use  ? Smoking status: Never  ? Smokeless tobacco: Never  ?Vaping Use  ? Vaping Use: Never used  ?Substance and Sexual Activity  ? Alcohol use: No  ?  Alcohol/week: 0.0 - 1.0 standard drinks  ? Drug use: No  ? Sexual activity: Not Currently  ?Other Topics Concern  ? Not on file  ?Social History Narrative  ? Single w/o kids as of 02/23/19   ? No pets   ? From [facility]  ? College-   ? 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   ? DPR dad Nicole Kindred [phone] & step mom Ivin Booty [phone]  ? ?Social Determinants of Health  ? ?Financial Resource Strain: Not on file  ?Food Insecurity: Not on file  ?Transportation Needs: Not on file  ?Physical Activity: Not on file  ?Stress: Not on file  ?Social Connections: Not on file  ? ? ?ALLERGIES:  ?Allergies  ?Allergen Reactions  ? Amoxicillin Swelling  ? Bacitracin Itching  ? Doxycycline Other (See Comments)  ?  Muscloskeletal pain  ? ? ?MEDICATIONS:  ?Current Outpatient Medications  ?Medication Sig Dispense Refill  ? chlorpheniramine-HYDROcodone (TUSSIONEX PENNKINETIC ER) 10-8 MG/5ML SUER Take 5 mLs by mouth at bedtime as needed for cough. 50 mL 0  ? LORazepam (ATIVAN) 1 MG tablet Take 1 tablet (1 mg total) by mouth every 8 (eight) hours. 5 tablet 0  ? medroxyPROGESTERone (PROVERA) 10 MG tablet Take 1 tablet (10 mg total) by mouth daily. Use for ten days 10 tablet 2  ? meloxicam (MOBIC) 7.5 MG tablet meloxicam 7.5 mg tablet ? TAKE 1 TABLET BY MOUTH TWICE DAILY    ? montelukast (SINGULAIR) 10 MG tablet Take 1 tablet (10 mg total) by mouth at bedtime. 90 tablet 3  ? PROAIR HFA 108 (90 Base) MCG/ACT inhaler Inhale 2 puffs into the lungs every 6 (six) hours as needed. 18 g 1  ? tamoxifen (NOLVADEX) 20 MG tablet Take 1 tablet (20 mg total) by mouth daily. 30 tablet 1  ? ?No current facility-administered medications for this encounter.  ? ? ?REVIEW OF SYSTEMS: A 10+ POINT REVIEW OF SYSTEMS WAS  OBTAINED including neurology, dermatology, psychiatry, cardiac, respiratory, lymph, extremities, GI, GU, musculoskeletal, constitutional, reproductive, HEENT. On the provided form, she reports wearing glasses, contacts, palpable breast lump, and bruising easily. She denies any other symptoms.  ?  ?PHYSICAL EXAM:   ? ? 05/06/2021  ?Vitals with BMI   ?Height 5' 7.01"   ?Weight 151 lbs   ?BMI 23.64   ?Systolic 408   ?Diastolic 63   ?Pulse 78   ? ? ?Lungs are clear to auscultation bilaterally. Heart has regular rate and rhythm. No palpable cervical, supraclavicular, or axillary adenopathy. Abdomen soft, non-tender, normal bowel sounds. ?Breast: Left breast with no palpable mass, nipple discharge, or bleeding. Right breast with biopsy site in the UOQ and some bruising with diffuse induration within the central portion  of the breast.   ? ?Patient has D-density breasts ? ?KPS = 90 ? ?100 - Normal; no complaints; no evidence of disease. ?90   - Able to carry on normal activity; minor signs or symptoms of disease. ?80   - Normal activity with effort; some signs or symptoms of disease. ?27   - Cares for self; unable to carry on normal activity or to do active work. ?60   - Requires occasional assistance, but is able to care for most of his personal needs. ?50   - Requires considerable assistance and frequent medical care. ?39   - Disabled; requires special care and assistance. ?30   - Severely disabled; hospital admission is indicated although death not imminent. ?20   - Very sick; hospital admission necessary; active supportive treatment necessary. ?10   - Moribund; fatal processes progressing rapidly. ?0     - Dead ? ?Karnofsky DA, Abelmann WH, Craver LS and Burchenal [facility] [phone]) The use of the nitrogen mustards in the palliative treatment of carcinoma: with particular reference to bronchogenic carcinoma Cancer 1 [phone] ? ?LABORATORY DATA:  ?Lab Results  ?Component Value Date  ? WBC 7.8 05/06/2021  ? HGB 13.8 05/06/2021  ? HCT  40.9 05/06/2021  ? MCV 87.0 05/06/2021  ? PLT 312 05/06/2021  ? ?Lab Results  ?Component Value Date  ? NA 140 05/06/2021  ? K 4.4 05/06/2021  ? CL 107 05/06/2021  ? CO2 26 05/06/2021  ? ?Lab Results  ?Component

## 2021-05-05 NOTE — Progress Notes (Signed)
[place] ?CONSULT NOTE ? ?Patient Care Team: ?McLean-Scocuzza, Nino Glow, MD as PCP - General (Internal Medicine) ?Mauro Kaufmann, RN as Oncology Nurse Navigator ?Rockwell Germany, RN as Oncology Nurse Navigator ?Jovita Kussmaul, MD as Consulting Physician (General Surgery) ?Nicholas Lose, MD as Consulting Physician (Hematology and Oncology) ?Gery Pray, MD as Consulting Physician (Radiation Oncology) ? ?CHIEF COMPLAINTS/PURPOSE OF CONSULTATION:  ?Newly diagnosed right breast cancer  ? ?HISTORY OF PRESENTING ILLNESS:  ?Jackie Miller 41 y.o. female is here because of recent diagnosis of right Breast cancer.  Screening mammogram detected right breast calcifications and distortion.  UOQ 10 o'clock position: 1.6 cm.  Calcifications span 7 cm.  Biopsy of the calcifications was DCIS that was ER/PR positive.  Ultrasound-guided biopsy of the mass revealed grade 2 IDC ER 70%, PR greater than 95%, HER2 negative, Ki-67 2% she presents to the clinic today for a consult. ? ? ?I reviewed her records extensively and collaborated the history with the patient. ? ?SUMMARY OF ONCOLOGIC HISTORY: ?Oncology History  ?Malignant neoplasm of upper-outer quadrant of right breast in female, estrogen receptor positive ([place])  ?04/24/2021 Initial Diagnosis  ? Screening mammogram detected right breast calcifications and distortion, ultrasound UOQ 10:00: 1.6 cm biopsy grade 2 IDC ER 70%, PR greater than 95%, HER2 negative, Ki-67 2%, calcs spanned 7 cm: Biopsy intermediate grade DCIS ER 95%, PR 95% ?  ?05/06/2021 Cancer Staging  ? Staging form: Breast, AJCC 8th Edition ?- Clinical stage from 05/06/2021: Stage IA (cT1b, cN0, cM0, G2, ER+, PR+, HER2-) - Signed by Nicholas Lose, MD on 05/06/2021 ?Stage prefix: Initial diagnosis ?Histologic grading system: 3 grade system ? ?  ? ? ? ?MEDICAL HISTORY:  ?Past Medical History:  ?Diagnosis Date  ? Asthma   ? Asthma   ? Bursitis   ? right hip  ? Chicken pox   ? Family history of adverse reaction  to anesthesia   ? Father - slow to wake  ? FH: colon cancer   ? mom died age 27 yo  ? Hay fever   ? Headache   ? tension/cluster  ? Jaundice   ? as a baby  ? Motion sickness   ? moving vehicles  ? PCOS (polycystic ovarian syndrome)   ? Wears contact lenses   ? ? ?SURGICAL HISTORY: ?Past Surgical History:  ?Procedure Laterality Date  ? BREAST BIOPSY Right 04/24/2021  ? COLONOSCOPY WITH PROPOFOL N/A 04/10/2019  ? Procedure: COLONOSCOPY WITH PROPOFOL;  Surgeon: Lucilla Lame, MD;  Location: [place];  Service: Endoscopy;  Laterality: N/A;  ? EYE SURGERY Bilateral 02/16/2004  ? lasik's  ? MYRINGOTOMY WITH TUBE PLACEMENT    ? TONSILLECTOMY AND ADENOIDECTOMY    ? ? ?SOCIAL HISTORY: ?Social History  ? ?Socioeconomic History  ? Marital status: Single  ?  Spouse name: Not on file  ? Number of children: Not on file  ? Years of education: Not on file  ? Highest education level: Not on file  ?Occupational History  ? Not on file  ?Tobacco Use  ? Smoking status: Never  ? Smokeless tobacco: Never  ?Vaping Use  ? Vaping Use: Never used  ?Substance and Sexual Activity  ? Alcohol use: No  ?  Alcohol/week: 0.0 - 1.0 standard drinks  ? Drug use: No  ? Sexual activity: Not Currently  ?Other Topics Concern  ? Not on file  ?Social History Narrative  ? Single w/o kids as of 02/23/19   ? No pets   ?  From [facility]  ? College-   ? 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   ? DPR dad Nicole Kindred [phone] & step mom Ivin Booty [phone]  ? ?Social Determinants of Health  ? ?Financial Resource Strain: Not on file  ?Food Insecurity: Not on file  ?Transportation Needs: Not on file  ?Physical Activity: Not on file  ?Stress: Not on file  ?Social Connections: Not on file  ?Intimate Partner Violence: Not on file  ? ? ?FAMILY HISTORY: ?Family History  ?Problem Relation Age of Onset  ? Colon cancer Mother 43  ? Hypertension Father   ? Breast cancer Maternal Grandmother   ? Lung cancer Paternal Grandfather   ? Breast cancer  Cousin   ? ? ?ALLERGIES:  is allergic to amoxicillin, bacitracin, and doxycycline. ? ?MEDICATIONS:  ?Current Outpatient Medications  ?Medication Sig Dispense Refill  ? LORazepam (ATIVAN) 1 MG tablet Take 1 tablet (1 mg total) by mouth every 8 (eight) hours. 5 tablet 0  ? tamoxifen (NOLVADEX) 20 MG tablet Take 1 tablet (20 mg total) by mouth daily. 30 tablet 1  ? chlorpheniramine-HYDROcodone (TUSSIONEX PENNKINETIC ER) 10-8 MG/5ML SUER Take 5 mLs by mouth at bedtime as needed for cough. 50 mL 0  ? medroxyPROGESTERone (PROVERA) 10 MG tablet Take 1 tablet (10 mg total) by mouth daily. Use for ten days 10 tablet 2  ? meloxicam (MOBIC) 7.5 MG tablet meloxicam 7.5 mg tablet ? TAKE 1 TABLET BY MOUTH TWICE DAILY    ? montelukast (SINGULAIR) 10 MG tablet Take 1 tablet (10 mg total) by mouth at bedtime. 90 tablet 3  ? PROAIR HFA 108 (90 Base) MCG/ACT inhaler Inhale 2 puffs into the lungs every 6 (six) hours as needed. 18 g 1  ? ?No current facility-administered medications for this visit.  ? ? ?REVIEW OF SYSTEMS:   ?Constitutional: Denies fevers, chills or abnormal night sweats ?All other systems were reviewed with the patient and are negative. ? ?PHYSICAL EXAMINATION: ?ECOG PERFORMANCE STATUS: 1 - Symptomatic but completely ambulatory ? ?Vitals:  ? 05/06/21 1243  ?BP: 131/63  ?Pulse: 78  ?Resp: 18  ?Temp: 97.9 ?F (36.6 ?C)  ?SpO2: 100%  ? ?Filed Weights  ? 05/06/21 1243  ?Weight: 151 lb (68.5 kg)  ? ? ?LABORATORY DATA:  ?I have reviewed the data as listed ?Lab Results  ?Component Value Date  ? WBC 7.8 05/06/2021  ? HGB 13.8 05/06/2021  ? HCT 40.9 05/06/2021  ? MCV 87.0 05/06/2021  ? PLT 312 05/06/2021  ? ?Lab Results  ?Component Value Date  ? NA 140 05/06/2021  ? K 4.4 05/06/2021  ? CL 107 05/06/2021  ? CO2 26 05/06/2021  ? ? ?RADIOGRAPHIC STUDIES: ?I have personally reviewed the radiological reports and agreed with the findings in the report. ? ?ASSESSMENT AND PLAN:  ?Malignant neoplasm of upper-outer quadrant of right  breast in female, estrogen receptor positive ([place]) ?04/24/2021:Screening mammogram detected right breast calcifications and distortion, ultrasound UOQ 10:00: 1.6 cm biopsy grade 2 IDC ER 70%, PR greater than 95%, HER2 negative, Ki-67 2%, calcs spanned 7 cm: Biopsy intermediate grade DCIS ER 95%, PR 95% ? ?Pathology and radiology counseling: Discussed with the patient, the details of pathology including the type of breast cancer,the clinical staging, the significance of ER, PR and HER-2/neu receptors and the implications for treatment. After reviewing the pathology in detail, we proceeded to discuss the different treatment options between surgery, radiation, chemotherapy, antiestrogen therapies. ? ?Treatment  plan: ?1.  Mastectomy with sentinel lymph node biopsy ?2. Oncotype DX testing to determine if she would benefit from chemo ?3.  Radiation depending on final pathology ?4.  Antiestrogen therapy with tamoxifen or ovarian function suppression with AI ? ?Return to clinic after surgery to discuss pathology report ? ? ?All questions were answered. The patient knows to call the clinic with any problems, questions or concerns. ?  ? Harriette Ohara, MD ?05/06/21 ? I Gardiner Coins am scribing for Dr. Lindi Adie ? ?I have reviewed the above documentation for accuracy and completeness, and I agree with the above. ?  ?

## 2021-05-06 ENCOUNTER — Inpatient Hospital Stay: Payer: 59 | Attending: Hematology and Oncology

## 2021-05-06 ENCOUNTER — Other Ambulatory Visit: Payer: Self-pay

## 2021-05-06 ENCOUNTER — Telehealth: Payer: Self-pay | Admitting: *Deleted

## 2021-05-06 ENCOUNTER — Encounter: Payer: Self-pay | Admitting: *Deleted

## 2021-05-06 ENCOUNTER — Ambulatory Visit: Payer: Self-pay | Admitting: General Surgery

## 2021-05-06 ENCOUNTER — Encounter: Payer: Self-pay | Admitting: General Practice

## 2021-05-06 ENCOUNTER — Ambulatory Visit: Payer: 59 | Attending: General Surgery | Admitting: Physical Therapy

## 2021-05-06 ENCOUNTER — Encounter: Payer: Self-pay | Admitting: Physical Therapy

## 2021-05-06 ENCOUNTER — Inpatient Hospital Stay (HOSPITAL_BASED_OUTPATIENT_CLINIC_OR_DEPARTMENT_OTHER): Payer: 59 | Admitting: Hematology and Oncology

## 2021-05-06 ENCOUNTER — Encounter: Payer: Self-pay | Admitting: Genetic Counselor

## 2021-05-06 ENCOUNTER — Ambulatory Visit
Admission: RE | Admit: 2021-05-06 | Discharge: 2021-05-06 | Disposition: A | Discharge status: Home / Self Care | Payer: 59 | Source: Ambulatory Visit | Attending: Radiation Oncology | Admitting: Radiation Oncology

## 2021-05-06 ENCOUNTER — Encounter: Payer: 59 | Admitting: Genetic Counselor

## 2021-05-06 DIAGNOSIS — Z170 Estrogen receptor positive status [ER+]: Secondary | ICD-10-CM | POA: Insufficient documentation

## 2021-05-06 DIAGNOSIS — Z1379 Encounter for other screening for genetic and chromosomal anomalies: Secondary | ICD-10-CM | POA: Insufficient documentation

## 2021-05-06 DIAGNOSIS — Z800 Family history of malignant neoplasm of digestive organs: Secondary | ICD-10-CM | POA: Insufficient documentation

## 2021-05-06 DIAGNOSIS — C50411 Malignant neoplasm of upper-outer quadrant of right female breast: Secondary | ICD-10-CM

## 2021-05-06 DIAGNOSIS — Z79899 Other long term (current) drug therapy: Secondary | ICD-10-CM | POA: Insufficient documentation

## 2021-05-06 DIAGNOSIS — R233 Spontaneous ecchymoses: Secondary | ICD-10-CM | POA: Insufficient documentation

## 2021-05-06 LAB — CBC WITH DIFFERENTIAL (CANCER CENTER ONLY)
Abs Immature Granulocytes: 0.01 10*3/uL (ref 0.00–0.07)
Basophils Absolute: 0.1 10*3/uL (ref 0.0–0.1)
Basophils Relative: 1 %
Eosinophils Absolute: 0.1 10*3/uL (ref 0.0–0.5)
Eosinophils Relative: 1 %
HCT: 40.9 % (ref 36.0–46.0)
Hemoglobin: 13.8 g/dL (ref 12.0–15.0)
Immature Granulocytes: 0 %
Lymphocytes Relative: 21 %
Lymphs Abs: 1.6 10*3/uL (ref 0.7–4.0)
MCH: 29.4 pg (ref 26.0–34.0)
MCHC: 33.7 g/dL (ref 30.0–36.0)
MCV: 87 fL (ref 80.0–100.0)
Monocytes Absolute: 0.5 10*3/uL (ref 0.1–1.0)
Monocytes Relative: 7 %
Neutro Abs: 5.5 10*3/uL (ref 1.7–7.7)
Neutrophils Relative %: 70 %
Platelet Count: 312 10*3/uL (ref 150–400)
RBC: 4.7 MIL/uL (ref 3.87–5.11)
RDW: 12.2 % (ref 11.5–15.5)
WBC Count: 7.8 10*3/uL (ref 4.0–10.5)
nRBC: 0 % (ref 0.0–0.2)

## 2021-05-06 LAB — GENETIC SCREENING ORDER

## 2021-05-06 LAB — CMP (CANCER CENTER ONLY)
ALT: 7 U/L (ref 0–44)
AST: 10 U/L — ABNORMAL LOW (ref 15–41)
Albumin: 4.7 g/dL (ref 3.5–5.0)
Alkaline Phosphatase: 43 U/L (ref 38–126)
Anion gap: 7 (ref 5–15)
BUN: 11 mg/dL (ref 6–20)
CO2: 26 mmol/L (ref 22–32)
Calcium: 9.7 mg/dL (ref 8.9–10.3)
Chloride: 107 mmol/L (ref 98–111)
Creatinine: 0.73 mg/dL (ref 0.44–1.00)
GFR, Estimated: >60 mL/min (ref >60)
Glucose, Bld: 91 mg/dL (ref 70–99)
Potassium: 4.4 mmol/L (ref 3.5–5.1)
Sodium: 140 mmol/L (ref 135–145)
Total Bilirubin: 0.8 mg/dL (ref 0.3–1.2)
Total Protein: 6.7 g/dL (ref 6.5–8.1)

## 2021-05-06 MED ORDER — TAMOXIFEN CITRATE 20 MG PO TABS: 20.0000 mg | ORAL_TABLET | Freq: Every day | ORAL | 1 refills | Status: DC

## 2021-05-06 MED ORDER — LORAZEPAM 1 MG PO TABS: 1.0000 mg | ORAL_TABLET | Freq: Three times a day (TID) | ORAL | 0 refills | Status: DC

## 2021-05-06 NOTE — Progress Notes (Signed)
[facility] Psychosocial Distress Screening ?Spiritual Care ? ?Met with Jackie Miller, her cousin Jackie Miller, and her sister-in-law in Breast Multidisciplinary Clinic to introduce Jackie Miller team/resources, reviewing distress screen per protocol.  The patient scored a 4 on the Psychosocial Distress Thermometer which indicates moderate distress. Also assessed for distress and other psychosocial needs.  ? ? ? 05/06/2021  ?ONCBCN DISTRESS SCREENING   ?Screening Type Initial Screening   ?   ?Distress experienced in past week (1-10) 4   ?   ?Information Concerns Type Lack of info about diagnosis;Lack of info about treatment   ?   ?Referral to support programs Yes   ? ?Jackie Miller reports strong faith that is helping her make meaning and cope with her diagnosis. She reports good family support; in fact, [place] near [place] family members. ? ?Provided empathic listening, normalization of feelings, and introduction to Jackie Miller support programming. ? ?Follow up needed: No. Jackie Miller is aware of ongoing chaplain availability and plans to reach out as needed/desired. ? ? ?Chaplain Lorrin Jackson, MDiv, [facility] ?Pager [phone] ?Voicemail [phone] ? ? ? ? ?  ?

## 2021-05-06 NOTE — Progress Notes (Deleted)
REFERRING PROVIDER: ?Nicholas Lose, MD ?[address] ?[place], [name] [address] ? ?PRIMARY PROVIDER:  ?McLean-Scocuzza, Nino Glow, MD ? ?PRIMARY REASON FOR VISIT:  ?*** ? ?HISTORY OF PRESENT ILLNESS:   ?Jackie Miller, Miller 41 y.o. female, was seen for Miller [place] cancer genetics consultation during the breast multidisciplinary clinic at the request of Dr. Lindi Adie due to Miller personal and family history of cancer.  Jackie Miller presents to clinic today to discuss the possibility of Miller hereditary predisposition to cancer, to discuss genetic testing, and to further clarify her future cancer risks, as well as potential cancer risks for family members.  ? ?In March 2023, at the age of 63, Jackie Miller was diagnosed with invasive ductal carcinoma of the right breast. The treatment plan ***.  ? ?CANCER HISTORY:  ?Oncology History  ?Malignant neoplasm of upper-outer quadrant of right breast in female, estrogen receptor positive ([place])  ?04/24/2021 Initial Diagnosis  ? Screening mammogram detected right breast calcifications and distortion, ultrasound UOQ 10:00: 1.6 cm biopsy grade 2 IDC ER 70%, PR greater than 95%, HER2 negative, Ki-67 2%, calcs spanned 7 cm: Biopsy intermediate grade DCIS ER 95%, PR 95% ?  ?05/06/2021 Cancer Staging  ? Staging form: Breast, AJCC 8th Edition ?- Clinical stage from 05/06/2021: Stage IA (cT1b, cN0, cM0, G2, ER+, PR+, HER2-) - Signed by Nicholas Lose, MD on 05/06/2021 ?Stage prefix: Initial diagnosis ?Histologic grading system: 3 grade system ? ?  ? ? ? ?RISK FACTORS:  ?Menarche was at age 59.  ?First live birth at age 32.  ?OCP use for approximately 0 years.  ?Ovaries intact: yes.  ?Uterus intact: yes.  ?Menopausal status: premenopausal.  ?HRT use: 0 years. ?Colonoscopy: yes; normal. ?Mammogram within the last year: yes. ?Any excessive radiation exposure in the past: no ? ?Past Medical History:  ?Diagnosis Date  ? Asthma   ? Asthma   ? Bursitis   ? right hip  ? Chicken pox   ? Family history of adverse  reaction to anesthesia   ? Father - slow to wake  ? FH: colon cancer   ? mom died age 64 yo  ? Hay fever   ? Headache   ? tension/cluster  ? Jaundice   ? as Miller baby  ? Motion sickness   ? moving vehicles  ? PCOS (polycystic ovarian syndrome)   ? Wears contact lenses   ? ? ?Past Surgical History:  ?Procedure Laterality Date  ? BREAST BIOPSY Right 04/24/2021  ? COLONOSCOPY WITH PROPOFOL N/Miller 04/10/2019  ? Procedure: COLONOSCOPY WITH PROPOFOL;  Surgeon: Lucilla Lame, MD;  Location: [facility];  Service: Endoscopy;  Laterality: N/Miller;  ? EYE SURGERY Bilateral 02/16/2004  ? lasik's  ? MYRINGOTOMY WITH TUBE PLACEMENT    ? TONSILLECTOMY AND ADENOIDECTOMY    ? ? ?Social History  ? ?Socioeconomic History  ? Marital status: Single  ?  Spouse name: Not on file  ? Number of children: Not on file  ? Years of education: Not on file  ? Highest education level: Not on file  ?Occupational History  ? Not on file  ?Tobacco Use  ? Smoking status: Never  ? Smokeless tobacco: Never  ?Vaping Use  ? Vaping Use: Never used  ?Substance and Sexual Activity  ? Alcohol use: No  ?  Alcohol/week: 0.0 - 1.0 standard drinks  ? Drug use: No  ? Sexual activity: Not Currently  ?Other Topics Concern  ? Not on file  ?Social History Narrative  ? Single  w/o kids as of 02/23/19   ? No pets   ? From [facility]  ? College-   ? 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   ? DPR dad Nicole Kindred [phone] & step mom Ivin Booty [phone]  ? ?Social Determinants of Health  ? ?Financial Resource Strain: Not on file  ?Food Insecurity: Not on file  ?Transportation Needs: Not on file  ?Physical Activity: Not on file  ?Stress: Not on file  ?Social Connections: Not on file  ?  ? ?FAMILY HISTORY:  ?We obtained Miller detailed, 4-generation family history.  Significant diagnoses are listed below: ?Family History  ?Problem Relation Age of Onset  ? Colon cancer Mother 55  ? Hypertension Father   ? Breast cancer Maternal Grandmother   ? Lung cancer Paternal  Grandfather   ? Breast cancer Cousin   ? ? ?Jackie Miller is {aware/unaware} of previous family history of genetic testing for hereditary cancer risks. Patient's maternal ancestors are of *** descent, and paternal ancestors are of *** descent. There {IS NO:12509} reported Ashkenazi Jewish ancestry. There {IS NO:12509} known consanguinity. ? ?GENETIC COUNSELING ASSESSMENT: Jackie Miller is Miller 41 y.o. female with Miller personal and family history of cancer which is somewhat suggestive of Miller hereditary cancer syndrome and predisposition to cancer given her young age at diagnosis. We, therefore, discussed and recommended the following at today's visit.  ? ?DISCUSSION: We discussed that 5 - 10% of cancer is hereditary, with most cases of hereditary breast cancer associated with mutations in BRCA1/2.  There are other genes that can be associated with hereditary breast and *** cancer syndromes. Type of cancer risk and level of risk are gene-specific. We discussed that testing is beneficial for several reasons including knowing how to follow individuals after completing their treatment, identifying whether potential treatment options would be beneficial, and understanding if other family members could be at risk for cancer and allowing them to undergo genetic testing.  ? ?***We reviewed the characteristics, features and inheritance patterns of hereditary cancer syndromes. We also discussed genetic testing, including the appropriate family members to test, the process of testing, insurance coverage and turn-around-time for results. We discussed the implications of Miller negative, positive and/or variant of uncertain significant result. In order to get genetic test results in Miller timely manner so that Jackie Miller can use these genetic test results for surgical decisions, we recommended Jackie Miller pursue genetic testing for the ***. Once complete, we recommend Jackie Miller pursue reflex genetic testing to Miller more comprehensive gene panel.  ? ?Jackie Miller   was offered Miller common hereditary cancer panel (47 genes) and an expanded pan-cancer panel (77 genes). Jackie Miller was informed of the benefits and limitations of each panel, including that expanded pan-cancer panels contain genes that do not have clear management guidelines at this point in time.  We also discussed that as the number of genes included on Miller panel increases, the chances of variants of uncertain significance increases.  After considering the benefits and limitations of each gene panel, Jackie Miller elected to have *** through ***. ? ?**** ? ?Based on Jackie Miller's personal and family history of cancer, she meets medical criteria for genetic testing. Despite that she meets criteria, she may still have an out of pocket cost. We discussed that if her out of pocket cost for testing is over $100, the laboratory should contact them to discuss self-pay prices, patient pay assistance programs, if applicable,  and other billing options.  ? ?PLAN: After considering the risks, benefits, and limitations, Jackie Miller provided informed consent to pursue genetic testing and the blood sample was sent to {Lab} Laboratories for analysis of the {test}. Results should be available within approximately 1-2 weeks' time, at which point they will be disclosed by telephone to Jackie Miller, as will any additional recommendations warranted by these results. Jackie Miller will receive Miller summary of her genetic counseling visit and Miller copy of her results once available. This information will also be available in Epic.  ? ?*** Despite our recommendation, Jackie Miller did not wish to pursue genetic testing at today's visit. We understand this decision and remain available to coordinate genetic testing at any time in the future. We, therefore, recommend Ms. Aveni continue to follow the cancer screening guidelines given by her primary healthcare provider. ? ?***Based on Ms. Moore's family history, we recommended her ***, who was diagnosed with *** at age  ***, have genetic counseling and testing. Ms. Whitelaw will let us know if we can be of any assistance in coordinating genetic counseling and/or testing for this family member.  ? ?Lastly, we encouraged Ms. I

## 2021-05-06 NOTE — Telephone Encounter (Signed)
Received oncotype testing order on core bx. Requisition faxed to [facility] and [place] ?

## 2021-05-06 NOTE — Assessment & Plan Note (Signed)
04/24/2021:Screening mammogram detected right breast calcifications and distortion, ultrasound UOQ 10:00: 1.6 cm biopsy grade 2 IDC ER 70%, PR greater than 95%, HER2 negative, Ki-67 2%, calcs spanned 7 cm: Biopsy intermediate grade DCIS ER 95%, PR 95% ? ?Pathology and radiology counseling: Discussed with the patient, the details of pathology including the type of breast cancer,the clinical staging, the significance of ER, PR and HER-2/neu receptors and the implications for treatment. After reviewing the pathology in detail, we proceeded to discuss the different treatment options between surgery, radiation, chemotherapy, antiestrogen therapies. ? ?Treatment plan: ?1.  Mastectomy with sentinel lymph node biopsy ?2. Oncotype DX testing to determine if she would benefit from chemo ?3.  Radiation depending on final pathology ?4.  Antiestrogen therapy with tamoxifen or ovarian function suppression with AI ? ?Return to clinic after surgery to discuss pathology report ? ?

## 2021-05-06 NOTE — Research (Signed)
Trial:  [employer] 2021-05 - Specimen Collection Study to Evaluate Biomarkers in Subjects with Cancer   ?Patient Jackie Miller was identified by Dr. Lindi Adie as a potential candidate for the above listed study.  This Clinical Research Nurse met with Jackie Miller, [id number], on 05/06/21 in a manner and location that ensures patient privacy to discuss participation in the above listed research study.  Patient is Accompanied by her cousin .  A copy of the informed consent document with embedded HIPAA language was provided to the patient.  Patient reads, speaks, and understands [place].   ?Patient was provided with the business card of this Nurse and encouraged to contact the research team with any questions.  Approximately 5 minutes were spent with the patient reviewing the informed consent documents.  Patient was provided the option of taking informed consent documents home to review and was encouraged to review at their convenience with their support network, including other care providers. Patient took the consent documents home to review. ?Foye Spurling, BSN, RN, CCRP ?Clinical Research Nurse II ?05/06/2021 4:01 PM ?  ?

## 2021-05-06 NOTE — Therapy (Addendum)
?OUTPATIENT PHYSICAL THERAPY BREAST CANCER BASELINE EVALUATION ? ? ?Patient Name: Jackie Miller ?MRN: [id number] ?DOB:1980-05-15, 41 y.o., female ?Today's Date: 05/06/2021 ? ? PT End of Session - 05/06/21 1636   ? ? Visit Number 1   ? Number of Visits 2   ? Date for PT Re-Evaluation 07/01/21   ? PT Start Time 4098   ? PT Stop Time 1191   ? PT Time Calculation (min) 32 min   ? Activity Tolerance Patient tolerated treatment well   ? Behavior During Therapy [facility] for tasks assessed/performed   ? ?  ?  ? ?  ? ? ?Past Medical History:  ?Diagnosis Date  ? Asthma   ? Asthma   ? Bursitis   ? right hip  ? Chicken pox   ? Family history of adverse reaction to anesthesia   ? Father - slow to wake  ? FH: colon cancer   ? mom died age 43 yo  ? Hay fever   ? Headache   ? tension/cluster  ? Jaundice   ? as a baby  ? Motion sickness   ? moving vehicles  ? PCOS (polycystic ovarian syndrome)   ? Wears contact lenses   ? ?Past Surgical History:  ?Procedure Laterality Date  ? BREAST BIOPSY Right 04/24/2021  ? COLONOSCOPY WITH PROPOFOL N/A 04/10/2019  ? Procedure: COLONOSCOPY WITH PROPOFOL;  Surgeon: Lucilla Lame, MD;  Location: [facility];  Service: Endoscopy;  Laterality: N/A;  ? EYE SURGERY Bilateral 02/16/2004  ? lasik's  ? MYRINGOTOMY WITH TUBE PLACEMENT    ? TONSILLECTOMY AND ADENOIDECTOMY    ? ?Patient Active Problem List  ? Diagnosis Date Noted  ? Genetic testing 05/06/2021  ? Malignant neoplasm of upper-outer quadrant of right breast in female, estrogen receptor positive ([place]) 05/01/2021  ? COVID-19 07/02/2020  ? Cough 07/02/2020  ? Annual physical exam 10/08/2019  ? Back pain 07/11/2019  ? Family history of malignant neoplasm of gastrointestinal tract   ? Family history of colon cancer requiring screening colonoscopy 02/26/2019  ? Acute bronchitis 01/28/2016  ? Chronic fatigue 04/27/2015  ? PCOS (polycystic ovarian syndrome) 04/27/2015  ? Intolerance to cold 04/27/2015  ? Asthmatic bronchitis 04/27/2015  ? ? ?PCP:  McLean-Scocuzza, Nino Glow, MD ? ?REFERRING PROVIDER: Jovita Kussmaul, MD ? ?REFERRING DIAG: Right breast cancer  ? ?THERAPY DIAG:  ?Malignant neoplasm of upper-outer quadrant of right breast in female, estrogen receptor positive ([place]) ? ?ONSET DATE: 03/19/2021 ? ?SUBJECTIVE                                                                                                                                                                                          ? ?  SUBJECTIVE STATEMENT: ?Patient reports she is here today to be seen by her medical team for her newly diagnosed right breast cancer.  ? ?PERTINENT HISTORY:  ?Patient was diagnosed on 03/19/2021 with right grade II invasive ductal carcinoma breast cancer. It measures 7 cm of calcifications and a 1.6 cm mass and is located in the upper outer quadrant. It is ER/PR positive and HER2 negative with a Ki67 of 2%.  ? ?PATIENT GOALS   reduce lymphedema risk and learn post op HEP.  ? ?PAIN:  ?Are you having pain? No ? ? ?PRECAUTIONS: Active CA ? ?HAND DOMINANCE: left ? ?WEIGHT BEARING RESTRICTIONS No ? ?FALLS:  ?Has patient fallen in last 6 months? No, Number of falls: 0 ? ?LIVING ENVIRONMENT: ?Patient lives with: alone ?Lives in: House/apartment ?Has following equipment at home: None ? 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 ?LEISURE: Dances ? ?PRIOR LEVEL OF FUNCTION: Independent ? ? ?OBJECTIVE ? ?COGNITION: ? Overall cognitive status: Within functional limits for tasks assessed   ? ?POSTURE:  ?WNL ? ?UPPER EXTREMITY AROM/PROM: ? ?A/PROM RIGHT  05/06/2021 ?  ?Shoulder extension 62  ?Shoulder flexion 153  ?Shoulder abduction 163  ?Shoulder internal rotation 70  ?Shoulder external rotation 85  ?  (Blank rows = not tested) ? ?A/PROM LEFT  05/06/2021  ?Shoulder extension 33  ?Shoulder flexion 150  ?Shoulder abduction 159  ?Shoulder internal rotation 73  ?Shoulder external rotation 90  ?  (Blank  rows = not tested) ? ? ?CERVICAL AROM: ?All within normal limits ? ? ?UPPER EXTREMITY STRENGTH: WNL ? ? ?LYMPHEDEMA ASSESSMENTS:  ? ?LANDMARK RIGHT  05/06/2021  ?10 cm proximal to olecranon process 27.6  ?Olecranon process 23.9  ?10 cm proximal to ulnar styloid process 20.5  ?Just proximal to ulnar styloid process 14.5  ?Across hand at thumb web space 17.6  ?At base of 2nd digit 5.7  ?(Blank rows = not tested) ? ?[place] LEFT  05/06/2021  ?10 cm proximal to olecranon process 28.5  ?Olecranon process 24.4  ?10 cm proximal to ulnar styloid process 21  ?Just proximal to ulnar styloid process 15  ?Across hand at thumb web space 18.8  ?At base of 2nd digit 6  ?(Blank rows = not tested) ? ? ?L-DEX LYMPHEDEMA SCREENING: ? ?The patient was assessed using the L-Dex machine today to produce a lymphedema index baseline score. The patient will be reassessed on a regular basis (typically every 3 months) to obtain new L-Dex scores. If the score is > 6.5 points away from his/her baseline score indicating onset of subclinical lymphedema, it will be recommended to wear a compression garment for 4 weeks, 12 hours per day and then be reassessed. If the score continues to be > 6.5 points from baseline at reassessment, we will initiate lymphedema treatment. Assessing in this manner has a 95% rate of preventing clinically significant lymphedema. ? ? L-DEX FLOWSHEETS - 05/06/21 1600   ? ?  ? L-DEX LYMPHEDEMA SCREENING  ? Measurement Type Unilateral   ? L-DEX MEASUREMENT EXTREMITY Upper Extremity   ? POSITION  Standing   ? DOMINANT SIDE Left   ? At Risk Side Right   ? BASELINE SCORE (UNILATERAL) 2   ? ?  ?  ? ?  ? ? ? ?QUICK DASH SURVEY: ? Katina Dung - 05/06/21 0001   ? ? Open a tight or new jar No difficulty   ? Do heavy household chores (  wash walls, wash floors) No difficulty   ? Carry a shopping bag or briefcase No difficulty   ? Wash your back No difficulty   ? Use a knife to cut food No difficulty   ? Recreational activities in which  you take some force or impact through your arm, shoulder, or hand (golf, hammering, tennis) No difficulty   ? During the past week, to what extent has your arm, shoulder or hand problem interfered with your normal social activities with family, friends, neighbors, or groups? Not at all   ? During the past week, to what extent has your arm, shoulder or hand problem limited your work or other regular daily activities Not at all   ? Arm, shoulder, or hand pain. None   ? Tingling (pins and needles) in your arm, shoulder, or hand None   ? Difficulty Sleeping No difficulty   ? DASH Score 0 %   ? ?  ?  ? ?  ? ? ? ?PATIENT EDUCATION:  ?Education details: Lymphedema risk reduction and post op shoulder/posture HEP ?Person educated: Patient ?Education method: Explanation, Demonstration, Handout ?Education comprehension: Patient verbalized understanding and returned demonstration ? ? ?HOME EXERCISE PROGRAM: ?Patient was instructed today in a home exercise program today for post op shoulder range of motion. These included active assist shoulder flexion in sitting, scapular retraction, wall walking with shoulder abduction, and hands behind head external rotation.  She was encouraged to do these twice a day, holding 3 seconds and repeating 5 times when permitted by her physician. ? ? ?ASSESSMENT: ? ?CLINICAL IMPRESSION: ?Patient was diagnosed on 03/19/2021 with right grade II invasive ductal carcinoma breast cancer. It measures 7 cm of calcifications and a 1.6 cm mass and is located in the upper outer quadrant. It is ER/PR positive and HER2 negative with a Ki67 of 2%. Her multidisciplinary medical team met prior to her assessments to determine a recommended treatment plan. She is planning to have a right mastectomy and sentinel node biopsy followed by Oncotype testing, possible radiation, and anti-estrogen therapy. She will benefit from a post op PT reassessment to determine needs and from L-Dex screens every 3 months for 2 years to  detect subclinical lymphedema. ? ?Pt will benefit from skilled therapeutic intervention to improve on the following deficits: Decreased knowledge of precautions, impaired UE functional use, pain, decreased ROM,

## 2021-05-07 ENCOUNTER — Telehealth: Payer: Self-pay | Admitting: *Deleted

## 2021-05-07 NOTE — Telephone Encounter (Signed)
[employer] 2021-05 - Specimen Collection Study to Evaluate Biomarkers in Subjects with Cancer   ?Called patient to follow up on her interest in the study.  Eligibility criteria was discussed and patient reports she was prescribed Tamoxifen. She would not be able to come back into clinic before she starts Tamoxifen and does not want to delay her treatment. Informed patient she would not be eligible for the study after she starts Tamoxifen and she verbalized understanding and is okay with not participating in the study for this reason. Thanked patient for her time and consideration of this study.  Dr. Lindi Adie notified.  ?Jackie Miller, BSN, RN, CCRP ?Clinical Research Nurse II ?05/07/2021 1:27 PM ? ?

## 2021-05-08 ENCOUNTER — Ambulatory Visit
Admission: RE | Admit: 2021-05-08 | Discharge: 2021-05-08 | Disposition: A | Discharge status: Home / Self Care | Payer: 59 | Source: Ambulatory Visit | Attending: General Surgery | Admitting: General Surgery

## 2021-05-08 ENCOUNTER — Telehealth: Payer: Self-pay | Admitting: *Deleted

## 2021-05-08 ENCOUNTER — Other Ambulatory Visit: Payer: Self-pay

## 2021-05-08 DIAGNOSIS — Z170 Estrogen receptor positive status [ER+]: Secondary | ICD-10-CM

## 2021-05-08 IMAGING — MR MR BREAST BILAT WO/W CM
13 of 15 series · 32 of 48 positions shown · IV contrast (gadavist)
Comparison: Previous exam(s).

CLINICAL DATA: 40-year-old female with biopsy proven intermediate
grade ductal carcinoma in-situ with calcifications in the
upper-outer quadrant of the right breast (X shaped clip) and grade
ll invasive ductal carcinoma, ductal carcinoma in-situ in the 10
o'clock region of the right breast (ribbon shaped clip).

EXAM:
BILATERAL BREAST MRI WITH AND WITHOUT CONTRAST
TECHNIQUE: Multiplanar, multisequence MR images of both breasts were obtained
prior to and following the intravenous administration of 6 ml of
Gadavist

[Series 2: t2_tirm_tra ipat (a-p) · axial · 3.0mm · 0.70mm/px · 1 of 75 slices shown]
[im 1/75]
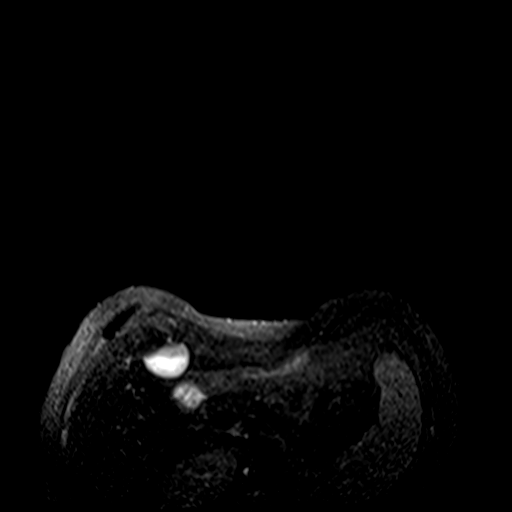

[Series 3: fl3d pre-cm non · axial · non-contrast · 1.2mm · 0.94mm/px · z∈[-129,+100]mm · 2 of 192 slices shown]
[im 1/192]
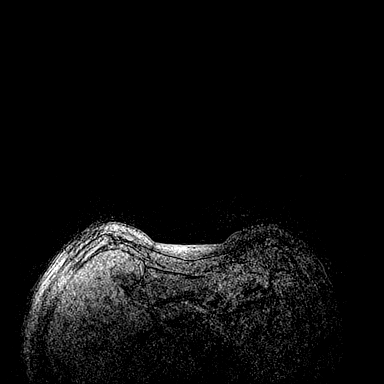
[im 192/192]
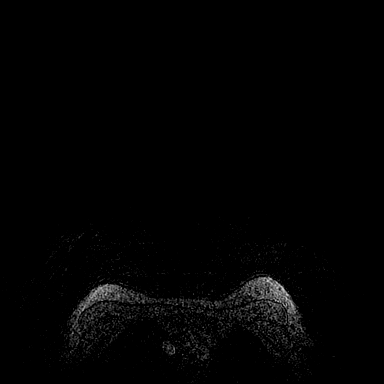

[Series 4: fl3d pre-cm · axial · non-contrast · 1.2mm · 0.94mm/px · z∈[-129,+100]mm · 2 of 192 slices shown]
[im 1/192]
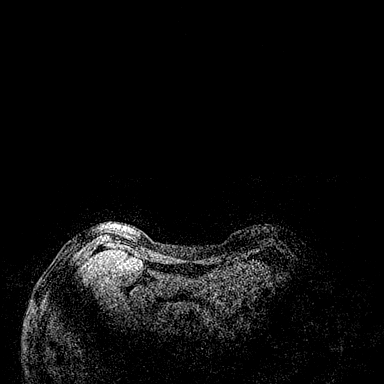
[im 192/192]
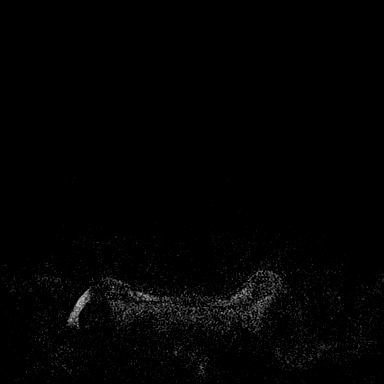

[Series 5: fl3d pre-cm 20 · axial · non-contrast · 1.2mm · 0.94mm/px · z∈[-129,+100]mm · 3 of 192 slices shown (1 of 3)]
[im 1/192]
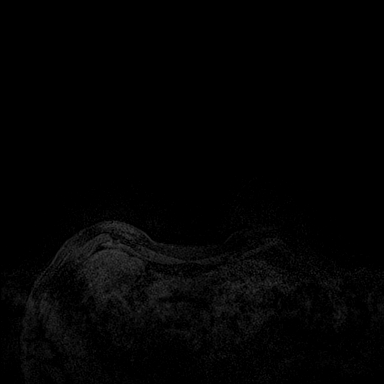
[im 96/192]
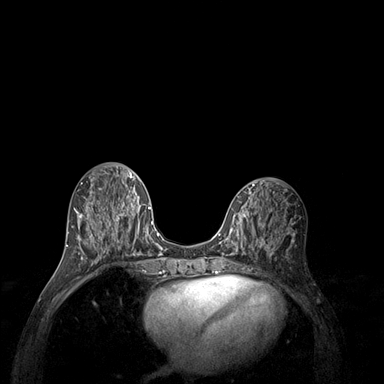
[im 192/192]
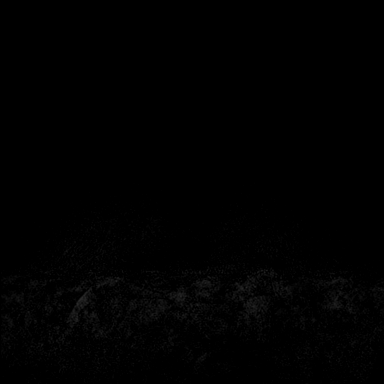

[Series 6: fl3d pre-cm 20 · axial · non-contrast · 1.2mm · 0.94mm/px · z∈[-129,+100]mm · 3 of 192 slices shown (2 of 3)]
[im 1/192]
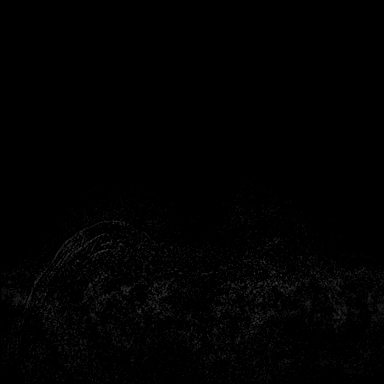
[im 96/192]
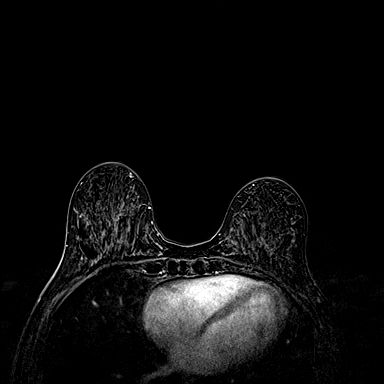
[im 192/192]
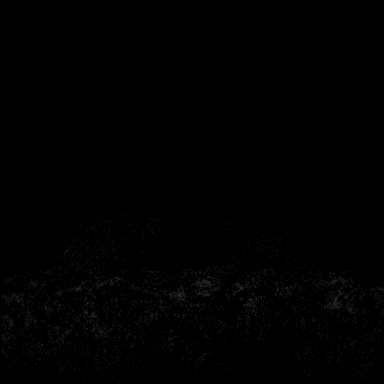

[Series 7: fl3d pre-cm 20 · axial · non-contrast · 230.4mm · 0.94mm/px · 1 of 1 slices shown (3 of 3)]
[im 1/1]
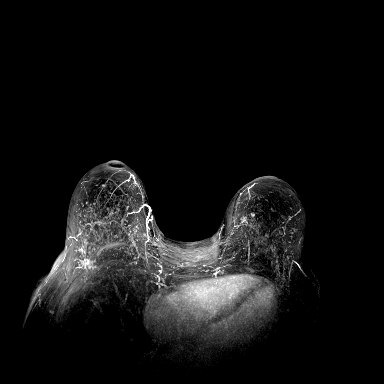

[Series 8: fl3d pre-cm 3min · axial · non-contrast · 1.2mm · 0.94mm/px · z∈[-129,+100]mm · 3 of 192 slices shown]
[im 1/192]
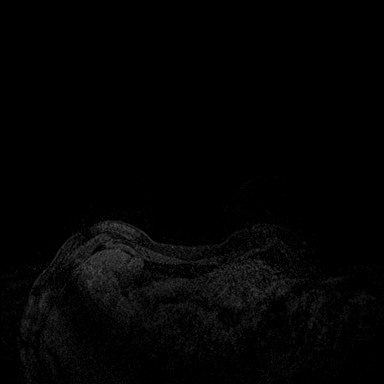
[im 96/192]
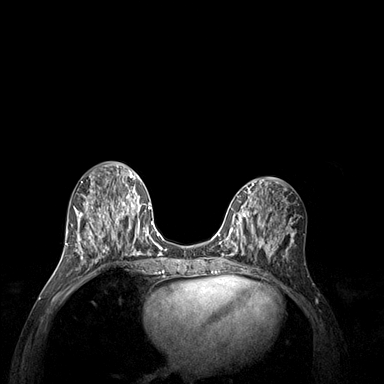
[im 192/192]
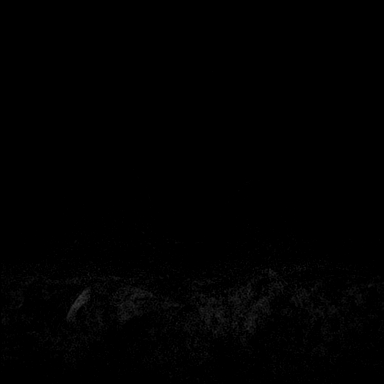

[Series 9: fl3d pre-cm 3min_sub · axial · non-contrast · 1.2mm · 0.94mm/px · z∈[-129,+100]mm · 3 of 192 slices shown]
[im 1/192]
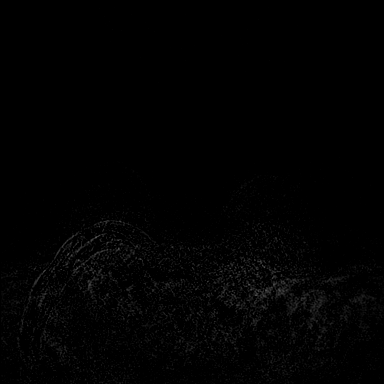
[im 96/192]
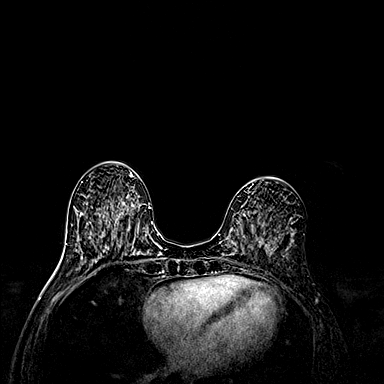
[im 192/192]
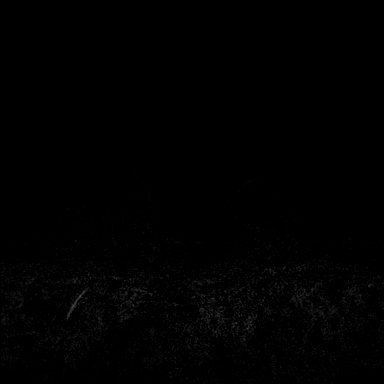

[Series 10: fl3d pre-cm 3min_sub_mip_tra · axial · non-contrast · 230.4mm · 0.94mm/px · 1 of 1 slices shown]
[im 1/1]
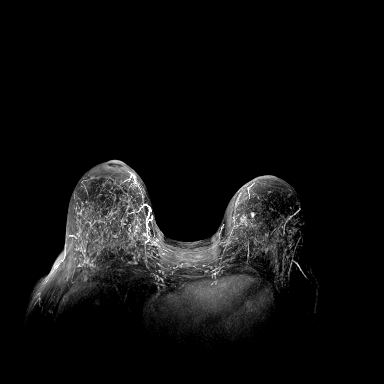

[Series 11: fl3d pre-cm 5 · axial · non-contrast · 1.2mm · 0.94mm/px · z∈[-129,+100]mm · 3 of 192 slices shown (1 of 3)]
[im 1/192]
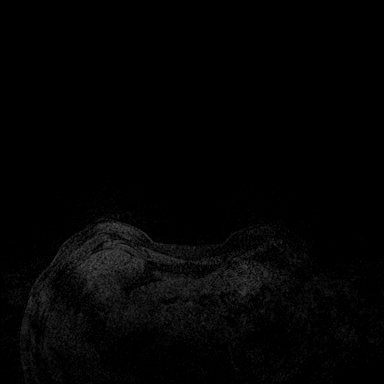
[im 96/192]
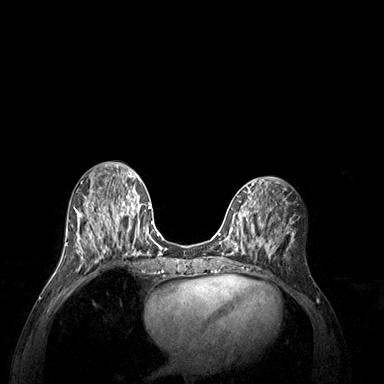
[im 192/192]
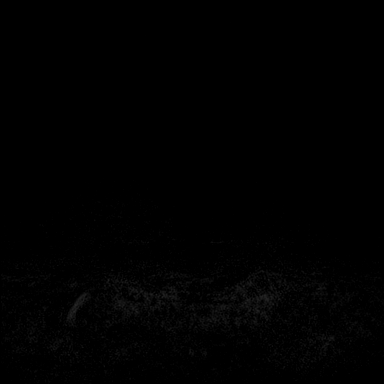

[Series 12: fl3d pre-cm 5 · axial · non-contrast · 1.2mm · 0.94mm/px · z∈[-129,+100]mm · 3 of 192 slices shown (2 of 3)]
[im 1/192]
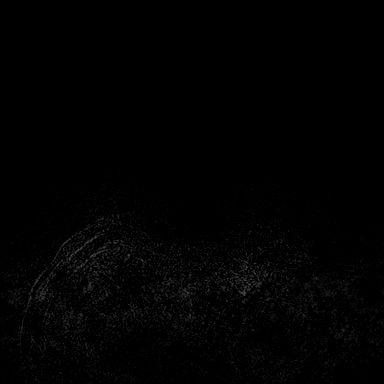
[im 96/192]
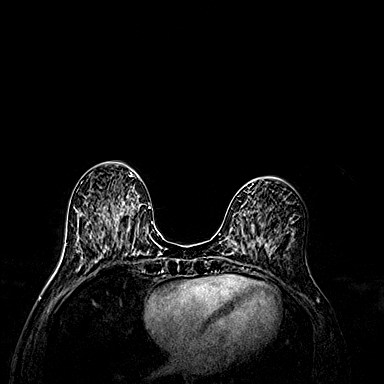
[im 192/192]
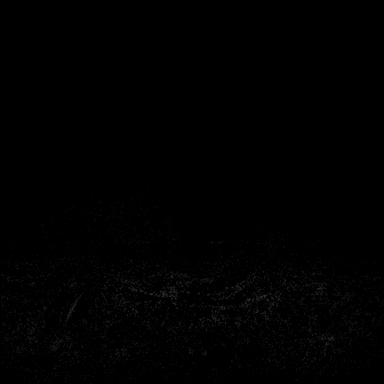

[Series 13: fl3d pre-cm 5 · axial · non-contrast · 230.4mm · 0.94mm/px · 1 of 1 slices shown (3 of 3)]
[im 1/1]
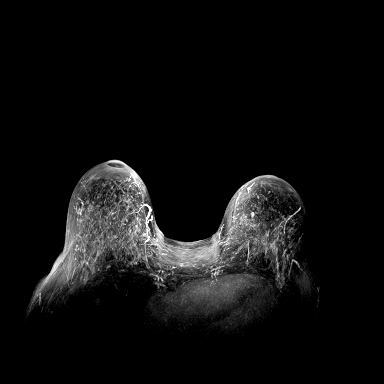

[(id) pre-cm · axial · non-contrast · 1.2mm · 0.94mm/px · z∈[-129,+37]mm · 6 of 768 slices shown]
[im 1/768]
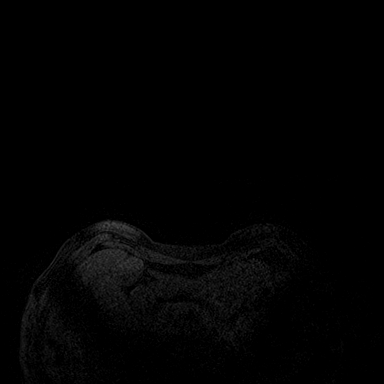
[im 140/768]
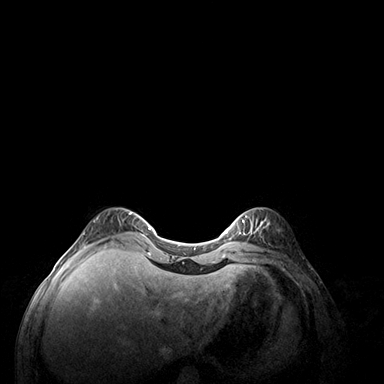
[im 210/768]
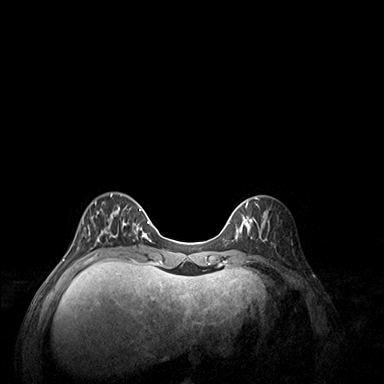
[im 349/768]
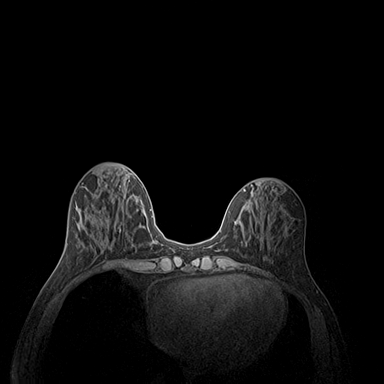
[im 419/768]
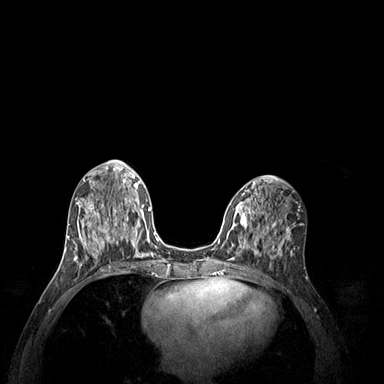
[im 558/768]
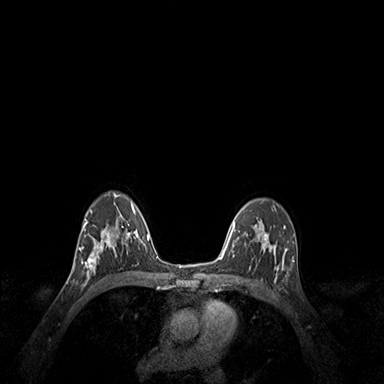

[32 of 48 positions shown; findings below may reference images not displayed]

Three-dimensional MR images were rendered by post-processing of the
original MR data on an independent workstation. The
three-dimensional MR images were interpreted, and findings are
reported in the following complete MRI report for this study. Three
dimensional images were evaluated at the independent interpreting
workstation using the DynaCAD thin client.
FINDINGS: Breast composition: d. Extreme fibroglandular tissue.

Background parenchymal enhancement: Marked

Right breast: There is an enhancing spiculated mass in the
upper-outer quadrant of the right breast (series 6, image 54)
measuring 1.9 x 1.1 x 2.2 cm. This corresponds with the biopsy
proven grade ll invasive ductal carcinoma and ductal carcinoma
in-situ. There is asymmetric nodular and non masslike enhancement
extending anterior and inferior to the spiculated mass. The mass and
abnormal enhancement measuring at least 6.5 cm. Mammographically the
mass and enhancement measured 7 cm.

Left breast: There is an enhancing mass in the medial aspect of the
left breast (series 6, image 89) measuring 1.6 x 0.5 x 1.0 cm. In
the lower-inner quadrant of the left breast there is an enhancing 5
x 5 x 5 mm circumscribed mass (series 6, image 129).

Lymph nodes: No abnormal appearing lymph nodes.

Ancillary findings:  None.
IMPRESSION: Spiculated mass with anterior and inferior extension of nodular and
non masslike enhancement in the upper-outer quadrant of the right
breast corresponding with the biopsy proven invasive ductal
carcinoma and ductal carcinoma in-situ.

1.6 cm suspicious enhancing mass in the medial aspect of the left
breast. 5 mm circumscribed enhancing mass in the lower-inner
quadrant of the left breast.

RECOMMENDATION:
MR guided core biopsies of the mass in the medial aspect of the left
breast and lower inner quadrant of the left breast is recommended.

Treatment planning of the known right breast cancer is recommended.

BI-RADS CATEGORY  4: Suspicious.

## 2021-05-08 MED ORDER — GADOBUTROL 1 MMOL/ML IV SOLN
7.0000 mL | Freq: Once | INTRAVENOUS | Status: AC | PRN
  Administered 2021-05-08: 7 mL via INTRAVENOUS

## 2021-05-08 NOTE — Telephone Encounter (Signed)
Received a call from this patient stating she just received a call from GI stating her MRI had not been authorized. She is scheduled for this Friday 3/24 at 11. Request for auth was sent to [facility] on 3/22 due to Dr. Marlou Starks ordering MRI. Informed patient I have sent another message regarding this and if for some reason it was not to be authorized in time we would get it r/s asap. Patient verbalized understanding.  ?

## 2021-05-11 ENCOUNTER — Other Ambulatory Visit: Payer: Self-pay | Admitting: Hematology and Oncology

## 2021-05-11 ENCOUNTER — Other Ambulatory Visit: Payer: Self-pay | Admitting: *Deleted

## 2021-05-11 ENCOUNTER — Telehealth: Payer: Self-pay | Admitting: *Deleted

## 2021-05-11 DIAGNOSIS — R9389 Abnormal findings on diagnostic imaging of other specified body structures: Secondary | ICD-10-CM

## 2021-05-11 DIAGNOSIS — Z170 Estrogen receptor positive status [ER+]: Secondary | ICD-10-CM

## 2021-05-11 NOTE — Telephone Encounter (Signed)
Received call from pt's cousin Doreatha Lew. I verified that she was on patient's HIPPA list.  She states that her and the patient have seen the MRI results and know that the recommendation is for add bx's and was asking when that would be scheduled.  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 Explained to her that I have ordered the biopsies and someone call the patient from GI to schedule.  She verbalized understanding.  ?

## 2021-05-14 ENCOUNTER — Encounter: Payer: Self-pay | Admitting: *Deleted

## 2021-05-14 ENCOUNTER — Telehealth: Payer: Self-pay | Admitting: *Deleted

## 2021-05-14 NOTE — Telephone Encounter (Signed)
Spoke with patient to follow up from [facility] 3/22 and assess navigation needs. Patient denies any questions or concerns at this time. She tells she has decided to have bil mast with reconstruction. I will let Dr. Ethlyn Gallery office know. She is seeing Dr. Marla Roe tomorrow.  Encouraged her to call should anything arise. Patient verbalized understanding.  ?

## 2021-05-15 ENCOUNTER — Ambulatory Visit: Payer: 59 | Admitting: Plastic Surgery

## 2021-05-15 ENCOUNTER — Encounter: Payer: Self-pay | Admitting: Plastic Surgery

## 2021-05-15 VITALS — BP 125/70 | HR 67 | Ht 67.0 in | Wt 151.0 lb

## 2021-05-15 DIAGNOSIS — E282 Polycystic ovarian syndrome: Secondary | ICD-10-CM | POA: Diagnosis not present

## 2021-05-15 DIAGNOSIS — M546 Pain in thoracic spine: Secondary | ICD-10-CM | POA: Diagnosis not present

## 2021-05-15 DIAGNOSIS — C50411 Malignant neoplasm of upper-outer quadrant of right female breast: Secondary | ICD-10-CM

## 2021-05-15 DIAGNOSIS — Z170 Estrogen receptor positive status [ER+]: Secondary | ICD-10-CM

## 2021-05-15 NOTE — Progress Notes (Signed)
? ?  Patient ID: Jackie Miller, female    DOB: 1981-01-21, 41 y.o.   MRN: [id number] ? ? ?Chief Complaint  ?Patient presents with  ? Consult  ? Breast Cancer  ? Breast Problem  ? ? ?The patient is a 41 year old female here for consultation for breast reconstruction.  She was recently diagnosed with right-sided breast cancer.  She had a screening mammogram which detected calcifications and distortion in the upper outer quadrant.  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  And HER2 negative.  Her past medical history is positive for back pain, polycystic ovary disease and headaches.  Her past surgical history is positive for colonoscopy, eye surgery, ear surgery, T&A and breast biopsy.  She is 5 feet 7 inches tall and weighs 151 pounds.  Preoperative bra size is a D and she has very low positioned breast with ptosis.  She is also scheduled for a left breast biopsy because some lesions were noted.  She has decided that she wants to go with bilateral mastectomies. ? ? ?Review of Systems  ?Constitutional: Negative.   ?Eyes: Negative.   ?Respiratory: Negative.  Negative for chest tightness and shortness of breath.   ?Cardiovascular: Negative.  Negative for leg swelling.  ?Gastrointestinal: Negative.   ?Endocrine: Negative.   ?Genitourinary: Negative.   ?Musculoskeletal: Negative.   ?Neurological: Negative.   ?Hematological: Negative.   ? ?Past Medical History:  ?Diagnosis Date  ? Asthma   ? Asthma   ? Bursitis   ? right hip  ? Chicken pox   ? Family history of adverse reaction to anesthesia   ? Father - slow to wake  ? FH: colon cancer   ? mom died age 71 yo  ? Hay fever   ? Headache   ? tension/cluster  ? Jaundice   ? as a baby  ? Motion sickness   ? moving vehicles  ? PCOS (polycystic ovarian syndrome)   ? Wears contact lenses   ?  ?Past Surgical History:  ?Procedure Laterality Date  ? BREAST BIOPSY Right 04/24/2021  ? COLONOSCOPY WITH PROPOFOL N/A 04/10/2019  ? Procedure: COLONOSCOPY WITH  PROPOFOL;  Surgeon: Lucilla Lame, MD;  Location: [facility];  Service: Endoscopy;  Laterality: N/A;  ? EYE SURGERY Bilateral 02/16/2004  ? lasik's  ? MYRINGOTOMY WITH TUBE PLACEMENT    ? TONSILLECTOMY AND ADENOIDECTOMY    ?  ? ? ?Current Outpatient Medications:  ?  chlorpheniramine-HYDROcodone (TUSSIONEX PENNKINETIC ER) 10-8 MG/5ML SUER, Take 5 mLs by mouth at bedtime as needed for cough., Disp: 50 mL, Rfl: 0 ?  LORazepam (ATIVAN) 1 MG tablet, Take 1 tablet (1 mg total) by mouth every 8 (eight) hours., Disp: 5 tablet, Rfl: 0 ?  medroxyPROGESTERone (PROVERA) 10 MG tablet, Take 1 tablet (10 mg total) by mouth daily. Use for ten days, Disp: 10 tablet, Rfl: 2 ?  meloxicam (MOBIC) 7.5 MG tablet, meloxicam 7.5 mg tablet  TAKE 1 TABLET BY MOUTH TWICE DAILY, Disp: , Rfl:  ?  montelukast (SINGULAIR) 10 MG tablet, Take 1 tablet (10 mg total) by mouth at bedtime., Disp: 90 tablet, Rfl: 3 ?  PROAIR HFA 108 (90 Base) MCG/ACT inhaler, Inhale 2 puffs into the lungs every 6 (six) hours as needed., Disp: 18 g, Rfl: 1 ?  tamoxifen (NOLVADEX) 20 MG tablet, Take 1 tablet (20 mg total) by mouth daily., Disp: 30 tablet, Rfl: 1  ? ?Objective:  ? ?Vitals:  ? 05/15/21 1124  ?BP: 125/70  ?  Pulse: 67  ?SpO2: 100%  ? ? ?Physical Exam ?Vitals reviewed.  ?Constitutional:   ?   Appearance: Normal appearance.  ?HENT:  ?   Head: Normocephalic and atraumatic.  ?Cardiovascular:  ?   Rate and Rhythm: Normal rate.  ?   Pulses: Normal pulses.  ?Pulmonary:  ?   Effort: Pulmonary effort is normal.  ?Abdominal:  ?   General: There is no distension.  ?   Palpations: Abdomen is soft.  ?   Tenderness: There is no abdominal tenderness.  ?Skin: ?   General: Skin is warm.  ?   Capillary Refill: Capillary refill takes less than 2 seconds.  ?   Coloration: Skin is not jaundiced.  ?   Findings: No bruising.  ?Neurological:  ?   Mental Status: She is alert and oriented to person, place, and time.  ?Psychiatric:     ?   Mood and Affect: Mood normal.     ?    Behavior: Behavior normal.     ?   Thought Content: Thought content normal.  ? ? ?Assessment & Plan:  ?PCOS (polycystic ovarian syndrome) ? ?Acute right-sided thoracic back pain ? ?Malignant neoplasm of upper-outer quadrant of right breast in female, estrogen receptor positive ([place]) ? ?The options for reconstruction we explained to the patient / family for breast reconstruction.  There are two general categories of reconstruction.  We can reconstruction a breast with implants or use the patient's own tissue.  These were further discussed as listed. ? ?Breast reconstruction is an optional procedure and eligibility depends on the full spectrum of the health of the patient and any co-morbidities.  More than one surgery is often needed to complete the reconstruction process.  The process can take three to twelve months to complete.  The breasts will not be identical due to many factors such as rib differences, shoulder asymmetry and treatments such as radiation.  The goal is to get the breasts to look normal and symmetrical in clothes.  Scars are a part of surgery and may fade some in time but will always be present under clothes.  Surgery may be an option on the non-cancer breast to achieve more symmetry.  No matter which procedure is chosen there is always the risk of complications and even failure of the body to heal.  This could result in no breast.   ? ?The options for reconstruction include:  ?1. Placement of a tissue expander with Acellular dermal matrix. When the expander is the desired size surgery is performed to remove the expander and place an implant.  In some cases the implant can be placed without an expander.  ?2. Autologous reconstruction can include using a muscle or tissue from another area of the body to create a breast.  ?3. Combined procedures (ie. latissismus dorsi flap) can be done with an expander / implant placed under the muscle. ?  ?The risks, benefits, scars and recovery time were discussed  for each of the above. Risks include bleeding, infection, hematoma, seroma, scarring, pain, wound healing complications, flap loss, fat necrosis, capsular contracture, need for implant removal, donor site complications, bulge, hernia, umbilical necrosis, need for urgent reoperation, and need for dressing changes.  ? ?The procedure the patient selected / that was best for the patient, was then discussed in further detail.  Total time: 45 minutes. This includes time spent with the patient during the visit as well as time spent before and after the visit reviewing the chart, documenting the  encounter, making phone calls and reviewing studies.  ? ?Due to the patient's size she is not a good candidate for autologous reconstruction.  She has opted for implant-based reconstruction.  She would like to be around about the same size or smaller.  We discussed implants and synthetic ADM for the reconstruction.  Most likely silicone implants.  I also spoke with Dr. Marlou Starks and let him know the patient's plan.  She has a biopsy scheduled for the 11th and surgery could be anytime after that. ? ?Pictures were obtained of the patient and placed in the chart with the patient's or guardian's permission. ? ? ?Loel Lofty Lekha Dancer, DO ?

## 2021-05-18 NOTE — Addendum Note (Signed)
Addended by: Harl Bowie on: 05/18/2021 03:10 PM ? ? Modules accepted: Orders ? ?

## 2021-05-25 ENCOUNTER — Other Ambulatory Visit: Payer: Self-pay | Admitting: Diagnostic Radiology

## 2021-05-25 ENCOUNTER — Ambulatory Visit
Admission: RE | Admit: 2021-05-25 | Discharge: 2021-05-25 | Disposition: A | Discharge status: Home / Self Care | Payer: 59 | Source: Ambulatory Visit | Attending: Hematology and Oncology | Admitting: Hematology and Oncology

## 2021-05-25 ENCOUNTER — Other Ambulatory Visit (HOSPITAL_COMMUNITY): Payer: Self-pay | Admitting: Diagnostic Radiology

## 2021-05-25 DIAGNOSIS — R9389 Abnormal findings on diagnostic imaging of other specified body structures: Secondary | ICD-10-CM

## 2021-05-25 DIAGNOSIS — Z170 Estrogen receptor positive status [ER+]: Secondary | ICD-10-CM

## 2021-05-25 HISTORY — PX: BREAST BIOPSY: SHX20

## 2021-05-25 IMAGING — MR MR BREAST BX W LOC DEV EA ADD LESION IMAGE BX SPEC MR GUIDE*L*
8 of 12 series · 31 of 48 positions shown · IV contrast (6 ML GADAVIST)
Comparison: Previous exams.
COMPARISON: Previous exams.

Addendum:
CLINICAL DATA: 40-year-old female with biopsy proven intermediate
grade ductal carcinoma in-situ with calcifications in the
upper-outer quadrant of the right breast (X shaped clip) and grade 2
invasive ductal carcinoma, ductal carcinoma in-situ in the 10
o'clock region of the right breast (ribbon shaped clip. Recent MRI
showed 2 indeterminate masses in the left breast. MR guided core
biopsy recommended.

EXAM:
MRI GUIDED CORE NEEDLE BIOPSIES OF THE LEFT BREAST
TECHNIQUE: Multiplanar, multisequence MR imaging of the left breast was
performed both before and after administration of intravenous
contrast.
CONTRAST:  6mL GADAVIST GADOBUTROL 1 MMOL/ML IV SOLN

[Series 5: fiducial unilateral · sagittal · 2.0mm · 1.33mm/px · 2 of 48 slices shown]
[im 1/48]
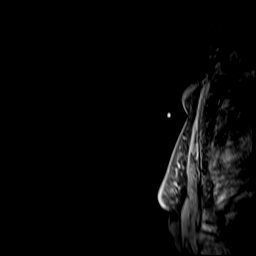
[im 48/48]
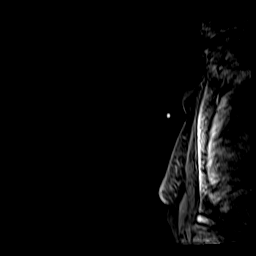

[Series 6: dynamic pre · axial · non-contrast · 1.3mm · 0.73mm/px · z∈[-149,+99]mm · 5 of 192 slices shown]
[im 1/192]
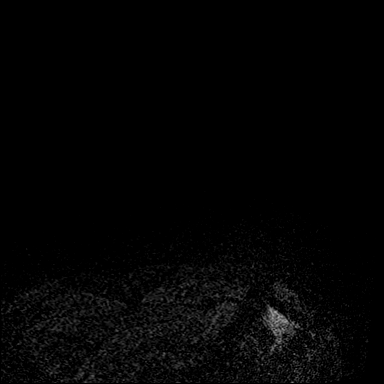
[im 48/192]
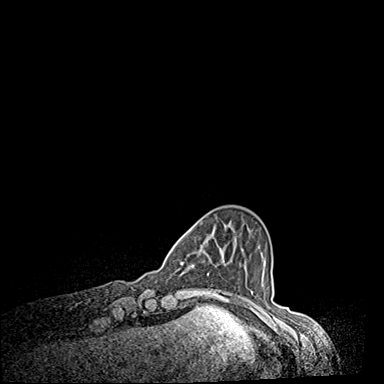
[im 96/192]
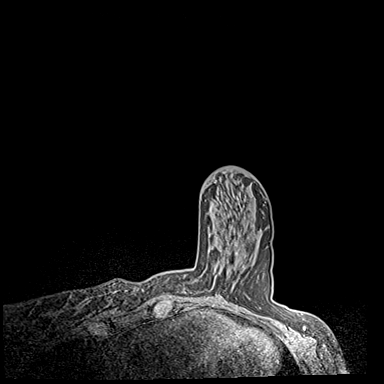
[im 144/192]
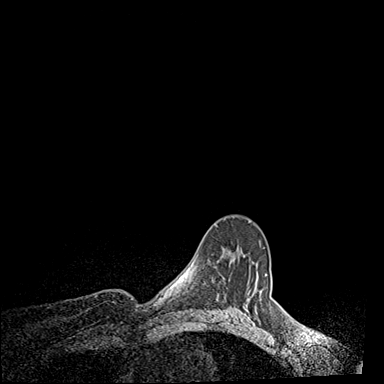
[im 192/192]
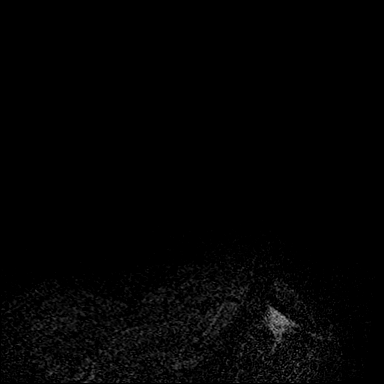

[Series 7: dynamic post 20 · axial · 1.3mm · 0.73mm/px · z∈[-149,+99]mm · 5 of 192 slices shown (1 of 2)]
[im 1/192]
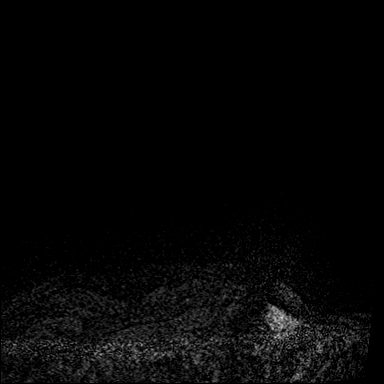
[im 48/192]
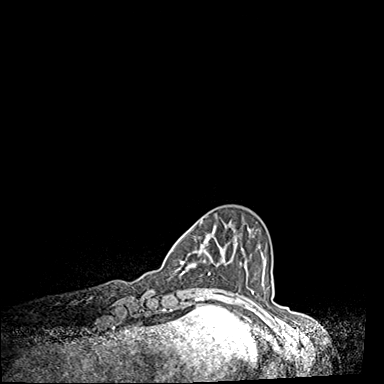
[im 96/192]
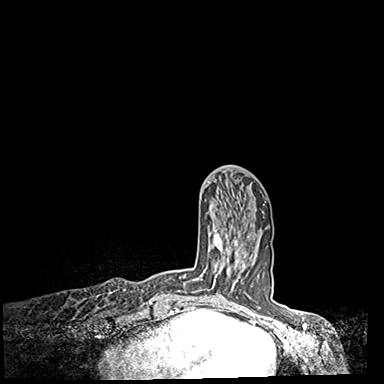
[im 144/192]
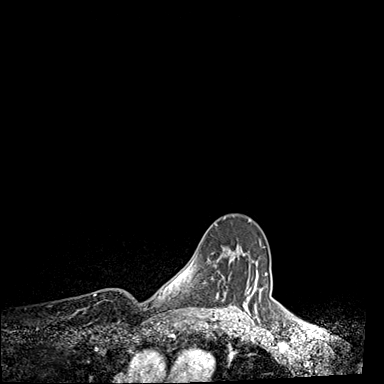
[im 192/192]
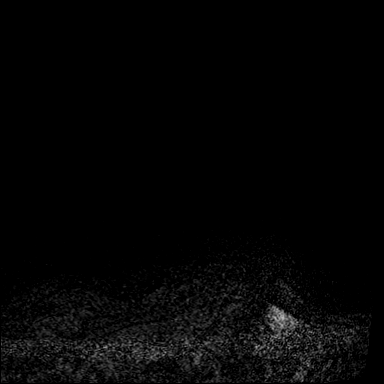

[Series 8: dynamic post 20 · axial · 1.3mm · 0.73mm/px · z∈[-149,+99]mm · 4 of 192 slices shown (2 of 2)]
[im 1/192]
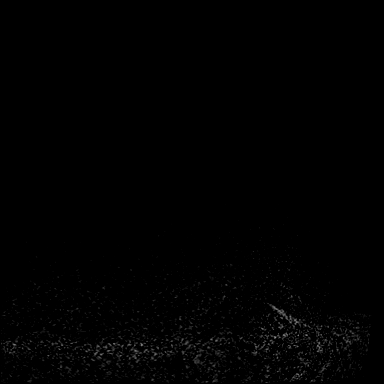
[im 64/192]
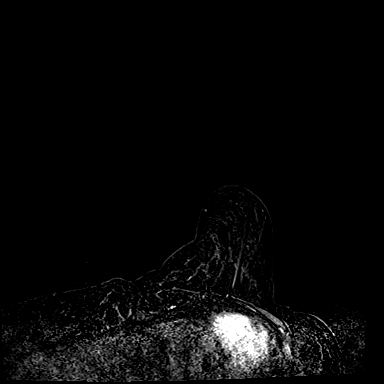
[im 128/192]
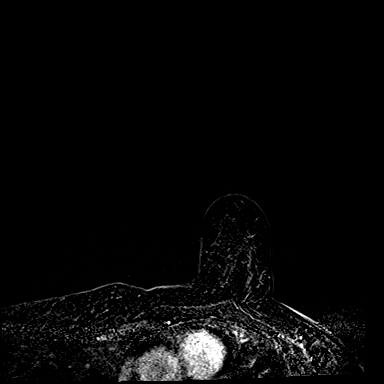
[im 192/192]
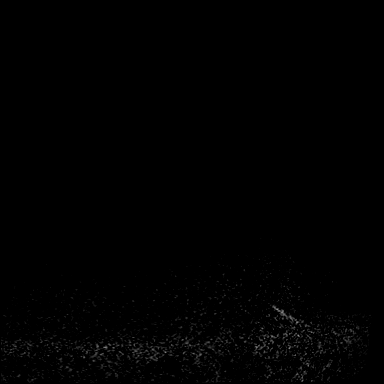

[Series 9: dynamic post 3 · axial · 1.3mm · 0.73mm/px · z∈[-149,+99]mm · 4 of 192 slices shown (1 of 2)]
[im 1/192]
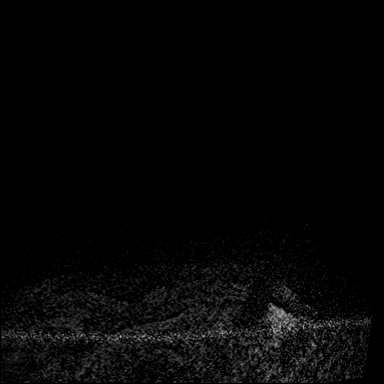
[im 64/192]
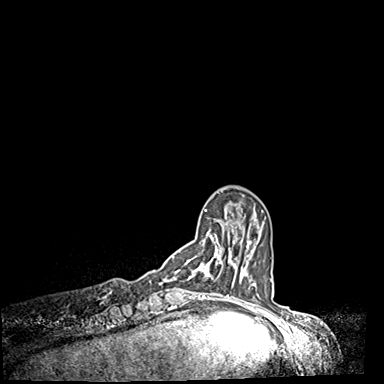
[im 128/192]
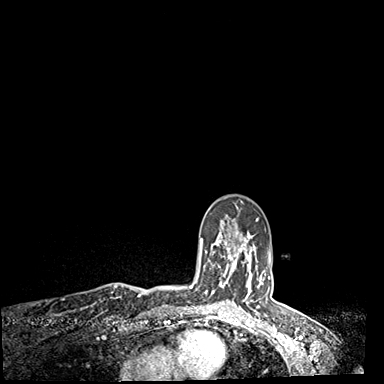
[im 192/192]
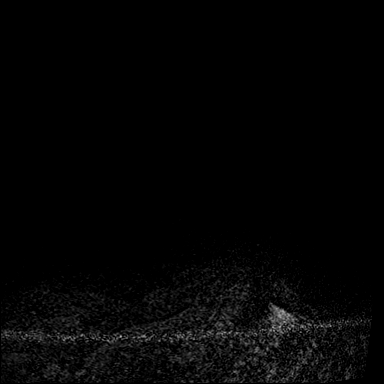

[Series 10: dynamic post 3 · axial · 1.3mm · 0.73mm/px · z∈[-149,+99]mm · 4 of 192 slices shown (2 of 2)]
[im 1/192]
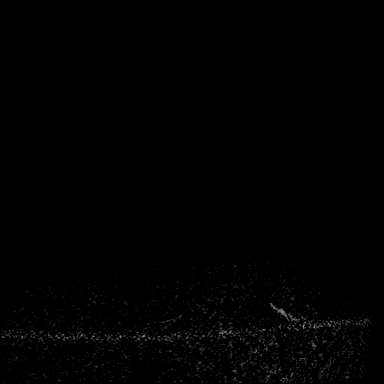
[im 64/192]
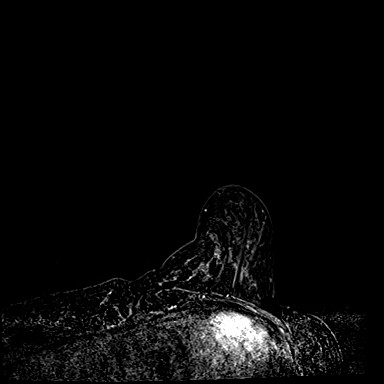
[im 128/192]
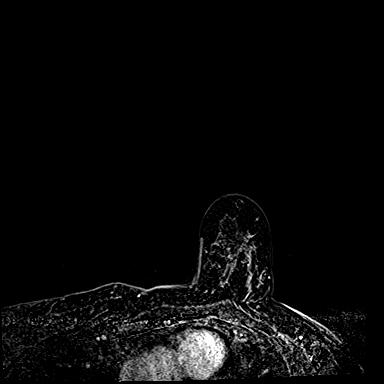
[im 192/192]
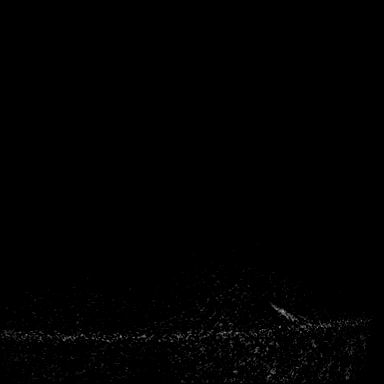

[Series 11: needle confirmation · axial · 1.3mm · 0.73mm/px · z∈[-149,+99]mm · 4 of 192 slices shown]
[im 1/192]
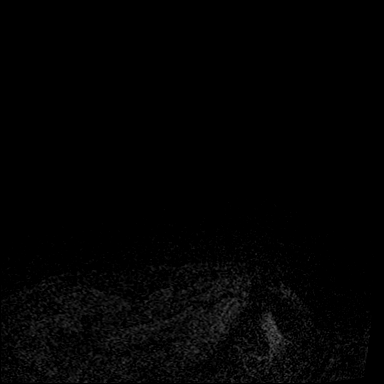
[im 64/192]
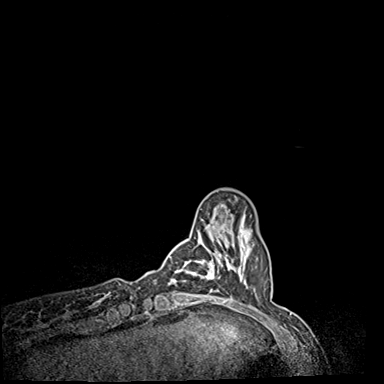
[im 128/192]
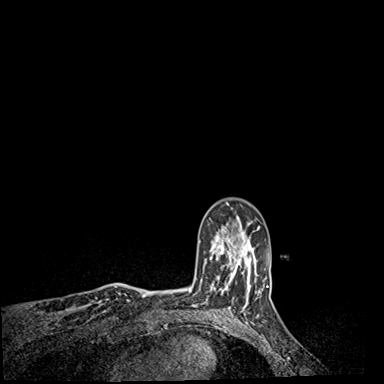
[im 192/192]
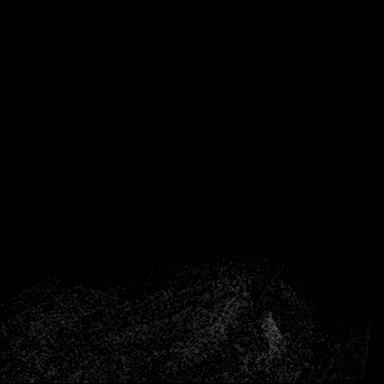

[Series 12: needle confirmation_sub · axial · 1.3mm · 0.73mm/px · z∈[-149,+16]mm · 3 of 192 slices shown]
[im 1/192]
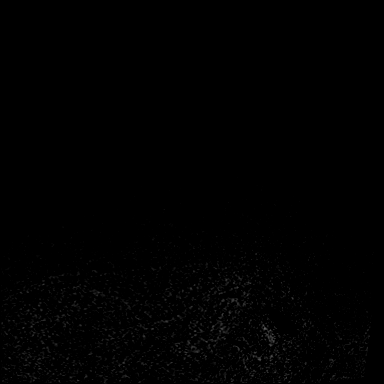
[im 64/192]
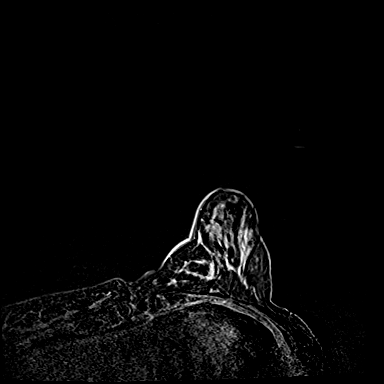
[im 128/192]
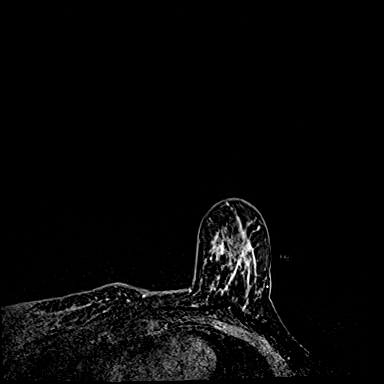

[31 of 48 positions shown; findings below may reference images not displayed]

FINDINGS: I met with the patient, and we discussed the procedure of MRI guided
biopsy, including risks, benefits, and alternatives. Specifically,
we discussed the risks of infection, bleeding, tissue injury, clip
migration, and inadequate sampling. Informed, written consent was
given. The usual time out protocol was performed immediately prior
to the procedure.

Using sterile technique, 1% lidocaine and 1% lidocaine with
epinephrine, MRI guidance, and a 9 gauge vacuum assisted device,
biopsy was performed of a mass in the medial aspect of the left
breast using a lateral to medial approach. At the conclusion of the
procedure, a barbell shaped tissue marker clip was deployed into the
biopsy cavity. Follow-up 2-view mammogram was performed and dictated
separately.

Using sterile technique, 1% lidocaine and 1% lidocaine with
epinephrine, MRI guidance, and a 9 gauge vacuum assisted device,
biopsy was performed of a mass in the lower-inner quadrant of the
left breast using a lateral to medial approach. At the conclusion of
the procedure, a cylindrical shaped tissue marker clip was deployed
into the biopsy cavity. Follow-up 2-view mammogram was performed and
dictated separately.
IMPRESSION: MRI guided biopsies of the left breast.  No apparent complications.

ADDENDUM:
Pathology revealed FIBROCYSTIC CHANGES- PSEUDOANGIOMATOUS STROMAL
HYPERPLASIA (PASH) - NEGATIVE FOR MALIGNANCY of the LEFT breast,
medial (barbell clip). This was found to be concordant by Dr. ALT
ALT.

Pathology revealed FIBROCYSTIC CHANGES- PSEUDOANGIOMATOUS STROMAL
HYPERPLASIA (PASH) - NEGATIVE FOR MALIGNANCY of the LEFT breast,
lower inner quadrant (cylinder clip). This was found to be
concordant by Dr. ALT.

Pathology results were discussed with the patient by telephone. The
patient reported doing well after the biopsies with tenderness at
the sites. Post biopsy instructions and care were reviewed and
questions were answered. The patient was encouraged to call The

Breast MRI recommended in 6 months per protocol, unless mastectomy/
mastectomies elected.

Pathology results reported by ALT RN on [DATE].

*** End of Addendum ***
FINDINGS: I met with the patient, and we discussed the procedure of MRI guided
biopsy, including risks, benefits, and alternatives. Specifically,
we discussed the risks of infection, bleeding, tissue injury, clip
migration, and inadequate sampling. Informed, written consent was
given. The usual time out protocol was performed immediately prior
to the procedure.

Using sterile technique, 1% lidocaine and 1% lidocaine with
epinephrine, MRI guidance, and a 9 gauge vacuum assisted device,
biopsy was performed of a mass in the medial aspect of the left
breast using a lateral to medial approach. At the conclusion of the
procedure, a barbell shaped tissue marker clip was deployed into the
biopsy cavity. Follow-up 2-view mammogram was performed and dictated
separately.

Using sterile technique, 1% lidocaine and 1% lidocaine with
epinephrine, MRI guidance, and a 9 gauge vacuum assisted device,
biopsy was performed of a mass in the lower-inner quadrant of the
left breast using a lateral to medial approach. At the conclusion of
the procedure, a cylindrical shaped tissue marker clip was deployed
into the biopsy cavity. Follow-up 2-view mammogram was performed and
dictated separately.
IMPRESSION: MRI guided biopsies of the left breast.  No apparent complications.

## 2021-05-25 MED ORDER — GADOBUTROL 1 MMOL/ML IV SOLN
6.0000 mL | Freq: Once | INTRAVENOUS | Status: AC | PRN
  Administered 2021-05-25: 6 mL via INTRAVENOUS

## 2021-05-26 ENCOUNTER — Encounter: Payer: Self-pay | Admitting: Hematology and Oncology

## 2021-05-26 ENCOUNTER — Encounter: Payer: Self-pay | Admitting: *Deleted

## 2021-05-26 ENCOUNTER — Telehealth: Payer: Self-pay | Admitting: *Deleted

## 2021-05-26 NOTE — Telephone Encounter (Signed)
2nd request for add tissue to be sent for oncoytpe,- 1st sample insuff. ?

## 2021-06-01 ENCOUNTER — Encounter: Payer: Self-pay | Admitting: *Deleted

## 2021-06-01 ENCOUNTER — Ambulatory Visit: Payer: Self-pay | Admitting: General Surgery

## 2021-06-05 ENCOUNTER — Encounter (HOSPITAL_COMMUNITY): Payer: Self-pay

## 2021-06-05 ENCOUNTER — Telehealth: Payer: Self-pay | Admitting: *Deleted

## 2021-06-05 ENCOUNTER — Encounter: Payer: Self-pay | Admitting: *Deleted

## 2021-06-05 ENCOUNTER — Telehealth: Payer: Self-pay | Admitting: Hematology and Oncology

## 2021-06-05 NOTE — Telephone Encounter (Signed)
Received oncotype score of 20 on core bx. Physician team notified. ?Called pt left vm with 20 and no chemo recommended with next step of sx. ?Contact information provided for questions or needs. ?

## 2021-06-05 NOTE — Telephone Encounter (Signed)
Per 4/21 in basket called and spoke to pt about appointment  pt confirmed appointment ? ?

## 2021-06-09 NOTE — H&P (View-Only) (Signed)
? ?  Patient ID: Jackie Miller, female    DOB: Sep 04, 1980, 41 y.o.   MRN: [id number] ? ?Chief Complaint  ?Patient presents with  ? Pre-op Exam  ? ? ?  ICD-10-CM   ?1. Malignant neoplasm of upper-outer quadrant of right breast in female, estrogen receptor positive ([place])  C50.411   ? Z17.0   ?  ? ? ? ?History of Present Illness: ?Jackie Miller is a 41 y.o.  female  with a history of right-sided breast cancer.  She presents for preoperative evaluation for upcoming procedure, bilateral mastectomy with immediate reconstruction using tissue expanders and Flex HD, scheduled for 06/29/2021 with Dr. Marla Roe and Dr. Marlou Starks. ? ?The patient has not had problems with anesthesia aside from mild PONV.  She is accompanied by her aunt at bedside.  She has already discussed FMLA with general surgery team.  She has already seen [facility] Miller and secured her postoperative compression bras.  She states that potential RT will depend on lymph node dissection at time of mastectomy.  She does endorse a history of asthma precipitated by seasonal allergies, currently well controlled.  She confirms that she is a D cup currently.  Denies any personal or family history of blood clots or clotting disorder.  Will reach out to Dr. Lindi Miller regarding tamoxifen perioperatively. ? ?Summary of Previous Visit: Patient was seen for consult by Dr. Marla Roe on 05/15/2021.  Preoperative bra size equals D cup.  She has decided on bilateral mastectomies for her recently diagnosed right-sided breast cancer.  She is not a good candidate for autologous reconstruction.  She is interested in implant-based reconstruction, likely silicone.     ? 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  Discussed 3 weeks FMLA. ? ?PMH Significant for: Right-sided breast cancer on tamoxifen, PCOS, asthma, Hx of TNA. ? ? ?Past Medical History: ?Allergies: ?Allergies  ?Allergen Reactions  ? Bacitracin Itching and Rash  ? Amoxicillin Swelling  ? Doxycycline Other (See Comments)   ?  Muscloskeletal pain  ? ? ?Current Medications: ? ?Current Outpatient Medications:  ?  diazepam (VALIUM) 2 MG tablet, Take 1 tablet (2 mg total) by mouth every 12 (twelve) hours as needed for muscle spasms., Disp: 20 tablet, Rfl: 0 ?  HYDROcodone-acetaminophen (NORCO) 5-325 MG tablet, Take 1 tablet by mouth every 6 (six) hours as needed for up to 5 days for severe pain., Disp: 20 tablet, Rfl: 0 ?  ondansetron (ZOFRAN-ODT) 4 MG disintegrating tablet, Take 1 tablet (4 mg total) by mouth every 8 (eight) hours as needed for nausea or vomiting., Disp: 20 tablet, Rfl: 0 ?  sulfamethoxazole-trimethoprim (BACTRIM DS) 800-160 MG tablet, Take 1 tablet by mouth 2 (two) times daily for 14 days., Disp: 28 tablet, Rfl: 0 ?  tamoxifen (NOLVADEX) 20 MG tablet, Take 1 tablet (20 mg total) by mouth daily., Disp: 30 tablet, Rfl: 1 ?  PROAIR HFA 108 (90 Base) MCG/ACT inhaler, Inhale 2 puffs into the lungs every 6 (six) hours as needed. (Patient not taking: Reported on 06/11/2021), Disp: 18 g, Rfl: 1 ? ?Past Medical Problems: ?Past Medical History:  ?Diagnosis Date  ? Asthma   ? Asthma   ? Bursitis   ? right hip  ? Chicken pox   ? Family history of adverse reaction to anesthesia   ? Father - slow to wake  ? FH: colon cancer   ? mom died age 3 yo  ? Hay fever   ? Headache   ? tension/cluster  ? Jaundice   ?  as a baby  ? Motion sickness   ? moving vehicles  ? PCOS (polycystic ovarian syndrome)   ? Wears contact lenses   ? ? ?Past Surgical History: ?Past Surgical History:  ?Procedure Laterality Date  ? BREAST BIOPSY Right 04/24/2021  ? BREAST BIOPSY Left 05/25/2021  ? COLONOSCOPY WITH PROPOFOL N/A 04/10/2019  ? Procedure: COLONOSCOPY WITH PROPOFOL;  Surgeon: Lucilla Lame, MD;  Location: [facility];  Service: Endoscopy;  Laterality: N/A;  ? EYE SURGERY Bilateral 02/16/2004  ? lasik's  ? MYRINGOTOMY WITH TUBE PLACEMENT    ? TONSILLECTOMY AND ADENOIDECTOMY    ? ? ?Social History: ?Social History  ? ?Socioeconomic History  ?  Marital status: Single  ?  Spouse name: Not on file  ? Number of children: Not on file  ? Years of education: Not on file  ? Highest education level: Not on file  ?Occupational History  ? Not on file  ?Tobacco Use  ? Smoking status: Never  ? Smokeless tobacco: Never  ?Vaping Use  ? Vaping Use: Never used  ?Substance and Sexual Activity  ? Alcohol use: No  ?  Alcohol/week: 0.0 - 1.0 standard drinks  ? Drug use: No  ? Sexual activity: Not Currently  ?Other Topics Concern  ? Not on file  ?Social History Narrative  ? Single w/o kids as of 02/23/19   ? No pets   ? From [employer]  ? College-   ? 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   ? DPR dad Jackie Miller [phone] & step mom Jackie Miller [phone]  ? ?Social Determinants of Health  ? ?Financial Resource Strain: Not on file  ?Food Insecurity: Not on file  ?Transportation Needs: Not on file  ?Physical Activity: Not on file  ?Stress: Not on file  ?Social Connections: Not on file  ?Intimate Partner Violence: Not on file  ? ? ?Family History: ?Family History  ?Problem Relation Age of Onset  ? Colon cancer Mother 14  ? Hypertension Father   ? Breast cancer Maternal Grandmother   ? Lung cancer Paternal Grandfather   ? Breast cancer Cousin   ? ? ?Review of Systems: ?ROS ?Denies recent trauma or infections. ? ?Physical Exam: ?Vital Signs ?BP 107/66 (BP Location: Left Arm, Patient Position: Sitting, Cuff Size: Normal)   Pulse 75   Ht '5\' 7"'$  (1.702 m)   Wt 152 lb (68.9 kg)   SpO2 100%   BMI 23.81 kg/m?  ? ?Physical Exam ?Constitutional:   ?   General: Not in acute distress. ?   Appearance: Normal appearance. Not ill-appearing.  ?HENT:  ?   Head: Normocephalic and atraumatic.  ?Eyes:  ?   Pupils: Pupils are equal, round. ?Cardiovascular:  ?   Rate and Rhythm: Normal rate. ?   Pulses: Normal pulses.  ?Pulmonary:  ?   Effort: No respiratory distress or increased work of breathing.  Speaks in full sentences. ?Abdominal:  ?   General: Abdomen is flat. No  distension.   ?Musculoskeletal: Normal range of motion. No lower extremity swelling or edema. No varicosities.  ?Skin: ?   General: Skin is warm and dry.  ?   Findings: No erythema or rash.  ?Neurological:  ?   Mental Status: Alert and oriented to person, place, and time.  ?Psychiatric:     ?   Mood and Affect: Mood normal.     ?   Behavior: Behavior normal.  ? ? ?Assessment/Plan: ?The  patient is scheduled for bilateral mastectomy with immediate reconstruction using tissue expander and Flex HD with Dr. Marla Roe.  Risks, benefits, and alternatives of procedure discussed, questions answered and consent obtained.   ? ?Smoking Status: Non-smoker. ?Last Mammogram: 04/2021; Results: Spiculated 2 x 2 x 1 cm mass upper outer quadrant right breast.  Enhancing mass in medial aspect of left breast.  Biopsy-proven cancer. ? ?Caprini Score: 4; Risk Factors include: Personal history of breast cancer and length of planned surgery. Recommendation for mechanical prophylaxis. Encourage early ambulation.  Will reach out to oncologist about holding tamoxifen perioperatively. ? ?Pictures obtained: 05/15/2021 ? ?Post-op Rx sent to pharmacy: Norco, Zofran, Bactrim, Valium. ? ?Patient was provided with the General Surgical Risk consent document and Pain Medication Agreement prior to their appointment.  They had adequate time to read through the risk consent documents and Pain Medication Agreement. We also discussed them in person together during this preop appointment. All of their questions were answered to their satisfaction.  Recommended calling if they have any further questions.  Risk consent form and Pain Medication Agreement to be scanned into patient's chart. ? ?The risks that can be encountered with and after placement of a breast expander placement were discussed and include the following but not limited to these: ?bleeding, infection, delayed healing, anesthesia risks, skin sensation changes, injury to structures including  nerves, blood vessels, and muscles which may be temporary or permanent, allergies to tape, suture materials and glues, blood products, topical preparations or injected agents, skin contour irregularities, skin discoloration a

## 2021-06-09 NOTE — Progress Notes (Signed)
? ?  Patient ID: Jackie Miller, female    DOB: 08/19/1980, 41 y.o.   MRN: [id number] ? ?Chief Complaint  ?Patient presents with  ? Pre-op Exam  ? ? ?  ICD-10-CM   ?1. Malignant neoplasm of upper-outer quadrant of right breast in female, estrogen receptor positive ([place])  C50.411   ? Z17.0   ?  ? ? ? ?History of Present Illness: ?CALVARY DIFRANCO is a 41 y.o.  female  with a history of right-sided breast cancer.  She presents for preoperative evaluation for upcoming procedure, bilateral mastectomy with immediate reconstruction using tissue expanders and Flex HD, scheduled for 06/29/2021 with Dr. Marla Roe and Dr. Marlou Starks. ? ?The patient has not had problems with anesthesia aside from mild PONV.  She is accompanied by her aunt at bedside.  She has already discussed FMLA with general surgery team.  She has already seen [facility] and secured her postoperative compression bras.  She states that potential RT will depend on lymph node dissection at time of mastectomy.  She does endorse a history of asthma precipitated by seasonal allergies, currently well controlled.  She confirms that she is a D cup currently.  Denies any personal or family history of blood clots or clotting disorder.  Will reach out to Dr. Lindi Adie regarding tamoxifen perioperatively. ? ?Summary of Previous Visit: Patient was seen for consult by Dr. Marla Roe on 05/15/2021.  Preoperative bra size equals D cup.  She has decided on bilateral mastectomies for her recently diagnosed right-sided breast cancer.  She is not a good candidate for autologous reconstruction.  She is interested in implant-based reconstruction, likely silicone.     ? 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  Discussed 3 weeks FMLA. ? ?PMH Significant for: Right-sided breast cancer on tamoxifen, PCOS, asthma, Hx of TNA. ? ? ?Past Medical History: ?Allergies: ?Allergies  ?Allergen Reactions  ? Bacitracin Itching and Rash  ? Amoxicillin Swelling  ? Doxycycline Other (See Comments)   ?  Muscloskeletal pain  ? ? ?Current Medications: ? ?Current Outpatient Medications:  ?  diazepam (VALIUM) 2 MG tablet, Take 1 tablet (2 mg total) by mouth every 12 (twelve) hours as needed for muscle spasms., Disp: 20 tablet, Rfl: 0 ?  HYDROcodone-acetaminophen (NORCO) 5-325 MG tablet, Take 1 tablet by mouth every 6 (six) hours as needed for up to 5 days for severe pain., Disp: 20 tablet, Rfl: 0 ?  ondansetron (ZOFRAN-ODT) 4 MG disintegrating tablet, Take 1 tablet (4 mg total) by mouth every 8 (eight) hours as needed for nausea or vomiting., Disp: 20 tablet, Rfl: 0 ?  sulfamethoxazole-trimethoprim (BACTRIM DS) 800-160 MG tablet, Take 1 tablet by mouth 2 (two) times daily for 14 days., Disp: 28 tablet, Rfl: 0 ?  tamoxifen (NOLVADEX) 20 MG tablet, Take 1 tablet (20 mg total) by mouth daily., Disp: 30 tablet, Rfl: 1 ?  PROAIR HFA 108 (90 Base) MCG/ACT inhaler, Inhale 2 puffs into the lungs every 6 (six) hours as needed. (Patient not taking: Reported on 06/11/2021), Disp: 18 g, Rfl: 1 ? ?Past Medical Problems: ?Past Medical History:  ?Diagnosis Date  ? Asthma   ? Asthma   ? Bursitis   ? right hip  ? Chicken pox   ? Family history of adverse reaction to anesthesia   ? Father - slow to wake  ? FH: colon cancer   ? mom died age 33 yo  ? Hay fever   ? Headache   ? tension/cluster  ? Jaundice   ?  as a baby  ? Motion sickness   ? moving vehicles  ? PCOS (polycystic ovarian syndrome)   ? Wears contact lenses   ? ? ?Past Surgical History: ?Past Surgical History:  ?Procedure Laterality Date  ? BREAST BIOPSY Right 04/24/2021  ? BREAST BIOPSY Left 05/25/2021  ? COLONOSCOPY WITH PROPOFOL N/A 04/10/2019  ? Procedure: COLONOSCOPY WITH PROPOFOL;  Surgeon: Lucilla Lame, MD;  Location: [place];  Service: Endoscopy;  Laterality: N/A;  ? EYE SURGERY Bilateral 02/16/2004  ? lasik's  ? MYRINGOTOMY WITH TUBE PLACEMENT    ? TONSILLECTOMY AND ADENOIDECTOMY    ? ? ?Social History: ?Social History  ? ?Socioeconomic History  ?  Marital status: Single  ?  Spouse name: Not on file  ? Number of children: Not on file  ? Years of education: Not on file  ? Highest education level: Not on file  ?Occupational History  ? Not on file  ?Tobacco Use  ? Smoking status: Never  ? Smokeless tobacco: Never  ?Vaping Use  ? Vaping Use: Never used  ?Substance and Sexual Activity  ? Alcohol use: No  ?  Alcohol/week: 0.0 - 1.0 standard drinks  ? Drug use: No  ? Sexual activity: Not Currently  ?Other Topics Concern  ? Not on file  ?Social History Narrative  ? Single w/o kids as of 02/23/19   ? No pets   ? From [facility]  ? College-   ? 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   ? DPR dad Nicole Kindred [phone] & step mom Ivin Booty [phone]  ? ?Social Determinants of Health  ? ?Financial Resource Strain: Not on file  ?Food Insecurity: Not on file  ?Transportation Needs: Not on file  ?Physical Activity: Not on file  ?Stress: Not on file  ?Social Connections: Not on file  ?Intimate Partner Violence: Not on file  ? ? ?Family History: ?Family History  ?Problem Relation Age of Onset  ? Colon cancer Mother 23  ? Hypertension Father   ? Breast cancer Maternal Grandmother   ? Lung cancer Paternal Grandfather   ? Breast cancer Cousin   ? ? ?Review of Systems: ?ROS ?Denies recent trauma or infections. ? ?Physical Exam: ?Vital Signs ?BP 107/66 (BP Location: Left Arm, Patient Position: Sitting, Cuff Size: Normal)   Pulse 75   Ht '5\' 7"'$  (1.702 m)   Wt 152 lb (68.9 kg)   SpO2 100%   BMI 23.81 kg/m?  ? ?Physical Exam ?Constitutional:   ?   General: Not in acute distress. ?   Appearance: Normal appearance. Not ill-appearing.  ?HENT:  ?   Head: Normocephalic and atraumatic.  ?Eyes:  ?   Pupils: Pupils are equal, round. ?Cardiovascular:  ?   Rate and Rhythm: Normal rate. ?   Pulses: Normal pulses.  ?Pulmonary:  ?   Effort: No respiratory distress or increased work of breathing.  Speaks in full sentences. ?Abdominal:  ?   General: Abdomen is flat. No  distension.   ?Musculoskeletal: Normal range of motion. No lower extremity swelling or edema. No varicosities.  ?Skin: ?   General: Skin is warm and dry.  ?   Findings: No erythema or rash.  ?Neurological:  ?   Mental Status: Alert and oriented to person, place, and time.  ?Psychiatric:     ?   Mood and Affect: Mood normal.     ?   Behavior: Behavior normal.  ? ? ?Assessment/Plan: ?The  patient is scheduled for bilateral mastectomy with immediate reconstruction using tissue expander and Flex HD with Dr. Marla Roe.  Risks, benefits, and alternatives of procedure discussed, questions answered and consent obtained.   ? ?Smoking Status: Non-smoker. ?Last Mammogram: 04/2021; Results: Spiculated 2 x 2 x 1 cm mass upper outer quadrant right breast.  Enhancing mass in medial aspect of left breast.  Biopsy-proven cancer. ? ?Caprini Score: 4; Risk Factors include: Personal history of breast cancer and length of planned surgery. Recommendation for mechanical prophylaxis. Encourage early ambulation.  Will reach out to oncologist about holding tamoxifen perioperatively. ? ?Pictures obtained: 05/15/2021 ? ?Post-op Rx sent to pharmacy: Norco, Zofran, Bactrim, Valium. ? ?Patient was provided with the General Surgical Risk consent document and Pain Medication Agreement prior to their appointment.  They had adequate time to read through the risk consent documents and Pain Medication Agreement. We also discussed them in person together during this preop appointment. All of their questions were answered to their satisfaction.  Recommended calling if they have any further questions.  Risk consent form and Pain Medication Agreement to be scanned into patient's chart. ? ?The risks that can be encountered with and after placement of a breast expander placement were discussed and include the following but not limited to these: ?bleeding, infection, delayed healing, anesthesia risks, skin sensation changes, injury to structures including  nerves, blood vessels, and muscles which may be temporary or permanent, allergies to tape, suture materials and glues, blood products, topical preparations or injected agents, skin contour irregularities, skin discoloration a

## 2021-06-11 ENCOUNTER — Ambulatory Visit (INDEPENDENT_AMBULATORY_CARE_PROVIDER_SITE_OTHER): Payer: 59 | Admitting: Physician Assistant

## 2021-06-11 ENCOUNTER — Encounter: Payer: Self-pay | Admitting: Physician Assistant

## 2021-06-11 VITALS — BP 107/66 | HR 75 | Ht 67.0 in | Wt 152.0 lb

## 2021-06-11 DIAGNOSIS — C50411 Malignant neoplasm of upper-outer quadrant of right female breast: Secondary | ICD-10-CM

## 2021-06-11 DIAGNOSIS — Z170 Estrogen receptor positive status [ER+]: Secondary | ICD-10-CM

## 2021-06-11 MED ORDER — SULFAMETHOXAZOLE-TRIMETHOPRIM 800-160 MG PO TABS: 1.0000 | ORAL_TABLET | Freq: Two times a day (BID) | ORAL | 0 refills | Status: AC

## 2021-06-11 MED ORDER — DIAZEPAM 2 MG PO TABS: 2.0000 mg | ORAL_TABLET | Freq: Two times a day (BID) | ORAL | 0 refills | Status: DC | PRN

## 2021-06-11 MED ORDER — HYDROCODONE-ACETAMINOPHEN 5-325 MG PO TABS: 1.0000 | ORAL_TABLET | Freq: Four times a day (QID) | ORAL | 0 refills | Status: AC | PRN

## 2021-06-11 MED ORDER — ONDANSETRON 4 MG PO TBDP: 4.0000 mg | ORAL_TABLET | Freq: Three times a day (TID) | ORAL | 0 refills | Status: DC | PRN

## 2021-06-19 ENCOUNTER — Encounter (HOSPITAL_BASED_OUTPATIENT_CLINIC_OR_DEPARTMENT_OTHER): Payer: Self-pay | Admitting: General Surgery

## 2021-06-19 ENCOUNTER — Other Ambulatory Visit: Payer: Self-pay

## 2021-06-22 NOTE — Progress Notes (Signed)
? ? ? ? ? ?  Patient Instructions  The night before surgery:  No food after midnight. ONLY clear liquids after midnight  The day of surgery (if you do NOT have diabetes):  Drink ONE (1) Pre-Surgery Clear Ensure as directed.   This drink was given to you during your hospital  pre-op appointment visit. The pre-op nurse will instruct you on the time to drink the  Pre-Surgery Ensure depending on your surgery time. Finish the drink at the designated time by the pre-op nurse.  Nothing else to drink after completing the  Pre-Surgery Clear Ensure.  The day of surgery (if you have diabetes): Drink ONE (1) Gatorade 2 (G2) as directed. This drink was given to you during your hospital  pre-op appointment visit.  The pre-op nurse will instruct you on the time to drink the   Gatorade 2 (G2) depending on your surgery time. Color of the Gatorade may vary. Red is not allowed. Nothing else to drink after completing the  Gatorade 2 (G2).         If you have questions, please contact your surgeon's office.Patient was provided with CHG cleanser to use at home before the procedure. Patient verbalized understanding of instructions. 

## 2021-06-29 ENCOUNTER — Encounter (HOSPITAL_BASED_OUTPATIENT_CLINIC_OR_DEPARTMENT_OTHER): Payer: Self-pay | Admitting: General Surgery

## 2021-06-29 ENCOUNTER — Ambulatory Visit (HOSPITAL_BASED_OUTPATIENT_CLINIC_OR_DEPARTMENT_OTHER): Payer: 59 | Admitting: Certified Registered"

## 2021-06-29 ENCOUNTER — Observation Stay (HOSPITAL_BASED_OUTPATIENT_CLINIC_OR_DEPARTMENT_OTHER)
Admission: RE | Admit: 2021-06-29 | Discharge: 2021-06-30 | Disposition: A | Discharge status: Home / Self Care | Payer: 59 | Source: Ambulatory Visit | Attending: Plastic Surgery | Admitting: Plastic Surgery

## 2021-06-29 ENCOUNTER — Encounter (HOSPITAL_BASED_OUTPATIENT_CLINIC_OR_DEPARTMENT_OTHER)
Admission: RE | Disposition: A | Discharge status: Home / Self Care | Payer: Self-pay | Source: Ambulatory Visit | Attending: Plastic Surgery

## 2021-06-29 ENCOUNTER — Other Ambulatory Visit: Payer: Self-pay

## 2021-06-29 DIAGNOSIS — N6012 Diffuse cystic mastopathy of left breast: Secondary | ICD-10-CM | POA: Diagnosis not present

## 2021-06-29 DIAGNOSIS — C50411 Malignant neoplasm of upper-outer quadrant of right female breast: Principal | ICD-10-CM

## 2021-06-29 DIAGNOSIS — J45909 Unspecified asthma, uncomplicated: Secondary | ICD-10-CM | POA: Insufficient documentation

## 2021-06-29 DIAGNOSIS — C50919 Malignant neoplasm of unspecified site of unspecified female breast: Secondary | ICD-10-CM | POA: Diagnosis present

## 2021-06-29 DIAGNOSIS — Z170 Estrogen receptor positive status [ER+]: Secondary | ICD-10-CM | POA: Diagnosis not present

## 2021-06-29 DIAGNOSIS — Z7951 Long term (current) use of inhaled steroids: Secondary | ICD-10-CM | POA: Insufficient documentation

## 2021-06-29 DIAGNOSIS — C50911 Malignant neoplasm of unspecified site of right female breast: Secondary | ICD-10-CM | POA: Diagnosis present

## 2021-06-29 HISTORY — DX: Other specified postprocedural states: Z98.890

## 2021-06-29 HISTORY — DX: Nausea with vomiting, unspecified: R11.2

## 2021-06-29 HISTORY — PX: TOTAL MASTECTOMY: SHX6129

## 2021-06-29 HISTORY — PX: BREAST RECONSTRUCTION WITH PLACEMENT OF TISSUE EXPANDER AND FLEX HD (ACELLULAR HYDRATED DERMIS): SHX6295

## 2021-06-29 HISTORY — PX: MASTECTOMY W/ SENTINEL NODE BIOPSY: SHX2001

## 2021-06-29 LAB — POCT PREGNANCY, URINE: Preg Test, Ur: NEGATIVE

## 2021-06-29 SURGERY — MASTECTOMY WITH SENTINEL LYMPH NODE BIOPSY
Anesthesia: General | Site: Breast | Laterality: Right

## 2021-06-29 MED ORDER — CELECOXIB 200 MG PO CAPS: 200.0000 mg | ORAL_CAPSULE | ORAL | Status: AC

## 2021-06-29 MED ORDER — GABAPENTIN 300 MG PO CAPS
ORAL_CAPSULE | ORAL | Status: AC
  Filled 2021-06-29: qty 1

## 2021-06-29 MED ORDER — CELECOXIB 200 MG PO CAPS
200.0000 mg | ORAL_CAPSULE | ORAL | Status: AC
  Administered 2021-06-29: 200 mg via ORAL

## 2021-06-29 MED ORDER — HYDROMORPHONE HCL 1 MG/ML IJ SOLN
0.2500 mg | INTRAMUSCULAR | Status: DC | PRN
  Administered 2021-06-29 (×2): 0.5 mg via INTRAVENOUS

## 2021-06-29 MED ORDER — PHENYLEPHRINE 80 MCG/ML (10ML) SYRINGE FOR IV PUSH (FOR BLOOD PRESSURE SUPPORT)
PREFILLED_SYRINGE | INTRAVENOUS | Status: AC
  Filled 2021-06-29: qty 20

## 2021-06-29 MED ORDER — CHLORHEXIDINE GLUCONATE CLOTH 2 % EX PADS: 6.0000 | MEDICATED_PAD | Freq: Once | CUTANEOUS | Status: AC

## 2021-06-29 MED ORDER — HYDROMORPHONE HCL 1 MG/ML IJ SOLN
INTRAMUSCULAR | Status: AC
  Filled 2021-06-29: qty 0.5

## 2021-06-29 MED ORDER — ACETAMINOPHEN 500 MG PO TABS
ORAL_TABLET | ORAL | Status: AC
  Filled 2021-06-29: qty 2

## 2021-06-29 MED ORDER — METHOCARBAMOL 500 MG PO TABS: 500.0000 mg | ORAL_TABLET | Freq: Three times a day (TID) | ORAL | Status: DC | PRN

## 2021-06-29 MED ORDER — POTASSIUM CHLORIDE IN NACL 20-0.45 MEQ/L-% IV SOLN: INTRAVENOUS | Status: DC

## 2021-06-29 MED ORDER — SUGAMMADEX SODIUM 200 MG/2ML IV SOLN
INTRAVENOUS | Status: DC | PRN
  Administered 2021-06-29: 200 mg via INTRAVENOUS

## 2021-06-29 MED ORDER — HYDROCODONE-ACETAMINOPHEN 5-325 MG PO TABS
1.0000 | ORAL_TABLET | ORAL | Status: DC | PRN
  Administered 2021-06-29: 1 via ORAL
  Administered 2021-06-29: 2 via ORAL
  Administered 2021-06-30: 1 via ORAL
  Filled 2021-06-29 (×2): qty 1
  Filled 2021-06-29: qty 2

## 2021-06-29 MED ORDER — MIDAZOLAM HCL 2 MG/2ML IJ SOLN
INTRAMUSCULAR | Status: AC
Start: 2021-06-29 — End: ?
  Filled 2021-06-29: qty 2

## 2021-06-29 MED ORDER — CHLORHEXIDINE GLUCONATE CLOTH 2 % EX PADS
6.0000 | MEDICATED_PAD | Freq: Once | CUTANEOUS | Status: AC
Start: 2021-06-29 — End: 2021-06-29

## 2021-06-29 MED ORDER — ONDANSETRON HCL 4 MG/2ML IJ SOLN
INTRAMUSCULAR | Status: AC
  Filled 2021-06-29: qty 12

## 2021-06-29 MED ORDER — MIDAZOLAM HCL 2 MG/2ML IJ SOLN
INTRAMUSCULAR | Status: AC
  Filled 2021-06-29: qty 2

## 2021-06-29 MED ORDER — PROPOFOL 500 MG/50ML IV EMUL
INTRAVENOUS | Status: AC
  Filled 2021-06-29: qty 150

## 2021-06-29 MED ORDER — DIPHENHYDRAMINE HCL 12.5 MG/5ML PO ELIX: 12.5000 mg | ORAL_SOLUTION | Freq: Four times a day (QID) | ORAL | Status: DC | PRN

## 2021-06-29 MED ORDER — LACTATED RINGERS IV SOLN: INTRAVENOUS | Status: DC

## 2021-06-29 MED ORDER — ONDANSETRON HCL 4 MG/2ML IJ SOLN: 4.0000 mg | Freq: Four times a day (QID) | INTRAMUSCULAR | Status: DC | PRN

## 2021-06-29 MED ORDER — MIDAZOLAM HCL 2 MG/2ML IJ SOLN
2.0000 mg | Freq: Once | INTRAMUSCULAR | Status: AC
  Administered 2021-06-29: 2 mg via INTRAVENOUS

## 2021-06-29 MED ORDER — PROPOFOL 10 MG/ML IV BOLUS
INTRAVENOUS | Status: DC | PRN
  Administered 2021-06-29: 200 mg via INTRAVENOUS

## 2021-06-29 MED ORDER — PHENYLEPHRINE HCL-NACL 20-0.9 MG/250ML-% IV SOLN
INTRAVENOUS | Status: DC | PRN
  Administered 2021-06-29: 30 ug/min via INTRAVENOUS

## 2021-06-29 MED ORDER — VANCOMYCIN HCL IN DEXTROSE 1-5 GM/200ML-% IV SOLN
1000.0000 mg | INTRAVENOUS | Status: AC
  Administered 2021-06-29: 1000 mg via INTRAVENOUS

## 2021-06-29 MED ORDER — IBUPROFEN 200 MG PO TABS
400.0000 mg | ORAL_TABLET | Freq: Four times a day (QID) | ORAL | Status: DC
  Administered 2021-06-29 – 2021-06-30 (×2): 400 mg via ORAL
  Filled 2021-06-29 (×2): qty 2

## 2021-06-29 MED ORDER — DEXAMETHASONE SODIUM PHOSPHATE 10 MG/ML IJ SOLN
INTRAMUSCULAR | Status: AC
  Filled 2021-06-29: qty 2

## 2021-06-29 MED ORDER — BUPIVACAINE-EPINEPHRINE (PF) 0.25% -1:200000 IJ SOLN
INTRAMUSCULAR | Status: AC
  Filled 2021-06-29: qty 30

## 2021-06-29 MED ORDER — GABAPENTIN 300 MG PO CAPS: 300.0000 mg | ORAL_CAPSULE | ORAL | Status: AC

## 2021-06-29 MED ORDER — BUPIVACAINE LIPOSOME 1.3 % IJ SUSP
INTRAMUSCULAR | Status: DC | PRN
  Administered 2021-06-29: 10 mL

## 2021-06-29 MED ORDER — FENTANYL CITRATE (PF) 100 MCG/2ML IJ SOLN
INTRAMUSCULAR | Status: DC | PRN
  Administered 2021-06-29 (×4): 50 ug via INTRAVENOUS

## 2021-06-29 MED ORDER — SODIUM CHLORIDE 0.9 % IV SOLN
INTRAVENOUS | Status: AC
  Filled 2021-06-29: qty 10

## 2021-06-29 MED ORDER — DEXMEDETOMIDINE (PRECEDEX) IN NS 20 MCG/5ML (4 MCG/ML) IV SYRINGE
PREFILLED_SYRINGE | INTRAVENOUS | Status: AC
  Filled 2021-06-29: qty 10

## 2021-06-29 MED ORDER — METHOCARBAMOL 1000 MG/10ML IJ SOLN: 500.0000 mg | Freq: Three times a day (TID) | INTRAVENOUS | Status: DC | PRN

## 2021-06-29 MED ORDER — FENTANYL CITRATE (PF) 100 MCG/2ML IJ SOLN
100.0000 ug | Freq: Once | INTRAMUSCULAR | Status: AC
  Administered 2021-06-29: 100 ug via INTRAVENOUS

## 2021-06-29 MED ORDER — MAGTRACE LYMPHATIC TRACER
INTRAMUSCULAR | Status: DC | PRN
Start: 2021-06-29 — End: 2021-06-29
  Administered 2021-06-29: 2 mL via INTRAMUSCULAR

## 2021-06-29 MED ORDER — CHLORHEXIDINE GLUCONATE CLOTH 2 % EX PADS: 6.0000 | MEDICATED_PAD | Freq: Once | CUTANEOUS | Status: DC

## 2021-06-29 MED ORDER — DIAZEPAM 2 MG PO TABS
2.0000 mg | ORAL_TABLET | Freq: Two times a day (BID) | ORAL | Status: DC | PRN
Start: 2021-06-29 — End: 2021-06-30
  Administered 2021-06-30: 2 mg via ORAL
  Filled 2021-06-29: qty 1

## 2021-06-29 MED ORDER — FENTANYL CITRATE (PF) 100 MCG/2ML IJ SOLN
INTRAMUSCULAR | Status: AC
  Filled 2021-06-29: qty 2

## 2021-06-29 MED ORDER — BUPIVACAINE-EPINEPHRINE 0.25% -1:200000 IJ SOLN
INTRAMUSCULAR | Status: DC | PRN
  Administered 2021-06-29: 10 mL

## 2021-06-29 MED ORDER — PHENYLEPHRINE HCL (PRESSORS) 10 MG/ML IV SOLN
INTRAVENOUS | Status: DC | PRN
  Administered 2021-06-29: 160 ug via INTRAVENOUS

## 2021-06-29 MED ORDER — HYDROMORPHONE HCL 1 MG/ML IJ SOLN: 1.0000 mg | INTRAMUSCULAR | Status: DC | PRN

## 2021-06-29 MED ORDER — POLYETHYLENE GLYCOL 3350 17 G PO PACK: 17.0000 g | PACK | Freq: Every day | ORAL | Status: DC | PRN

## 2021-06-29 MED ORDER — LIDOCAINE 2% (20 MG/ML) 5 ML SYRINGE
INTRAMUSCULAR | Status: AC
  Filled 2021-06-29: qty 15

## 2021-06-29 MED ORDER — ACETAMINOPHEN 325 MG PO TABS
325.0000 mg | ORAL_TABLET | Freq: Four times a day (QID) | ORAL | Status: DC
  Administered 2021-06-29: 325 mg via ORAL
  Filled 2021-06-29: qty 1

## 2021-06-29 MED ORDER — CELECOXIB 200 MG PO CAPS
ORAL_CAPSULE | ORAL | Status: AC
  Filled 2021-06-29: qty 1

## 2021-06-29 MED ORDER — LIDOCAINE HCL (CARDIAC) PF 100 MG/5ML IV SOSY
PREFILLED_SYRINGE | INTRAVENOUS | Status: DC | PRN
  Administered 2021-06-29: 40 mg via INTRAVENOUS

## 2021-06-29 MED ORDER — SODIUM CHLORIDE 0.9 % IV SOLN
INTRAVENOUS | Status: DC | PRN
  Administered 2021-06-29: 500 mL

## 2021-06-29 MED ORDER — ACETAMINOPHEN 500 MG PO TABS: 1000.0000 mg | ORAL_TABLET | ORAL | Status: AC

## 2021-06-29 MED ORDER — POTASSIUM CHLORIDE IN NACL 20-0.9 MEQ/L-% IV SOLN
INTRAVENOUS | Status: DC
Start: 2021-06-29 — End: 2021-06-30

## 2021-06-29 MED ORDER — ROCURONIUM BROMIDE 10 MG/ML (PF) SYRINGE
PREFILLED_SYRINGE | INTRAVENOUS | Status: AC
  Filled 2021-06-29: qty 10

## 2021-06-29 MED ORDER — GABAPENTIN 300 MG PO CAPS
300.0000 mg | ORAL_CAPSULE | ORAL | Status: AC
Start: 2021-06-29 — End: 2021-06-29
  Administered 2021-06-29: 300 mg via ORAL

## 2021-06-29 MED ORDER — ROCURONIUM BROMIDE 10 MG/ML (PF) SYRINGE
PREFILLED_SYRINGE | INTRAVENOUS | Status: AC
  Filled 2021-06-29: qty 20

## 2021-06-29 MED ORDER — ROCURONIUM BROMIDE 100 MG/10ML IV SOLN
INTRAVENOUS | Status: DC | PRN
  Administered 2021-06-29: 100 mg via INTRAVENOUS
  Administered 2021-06-29: 30 mg via INTRAVENOUS

## 2021-06-29 MED ORDER — CIPROFLOXACIN IN D5W 400 MG/200ML IV SOLN
400.0000 mg | Freq: Two times a day (BID) | INTRAVENOUS | Status: DC
  Administered 2021-06-29 – 2021-06-30 (×2): 400 mg via INTRAVENOUS
  Filled 2021-06-29 (×2): qty 200

## 2021-06-29 MED ORDER — ONDANSETRON 4 MG PO TBDP: 4.0000 mg | ORAL_TABLET | Freq: Four times a day (QID) | ORAL | Status: DC | PRN

## 2021-06-29 MED ORDER — ONDANSETRON HCL 4 MG/2ML IJ SOLN
INTRAMUSCULAR | Status: DC | PRN
  Administered 2021-06-29: 4 mg via INTRAVENOUS

## 2021-06-29 MED ORDER — ACETAMINOPHEN 500 MG PO TABS
1000.0000 mg | ORAL_TABLET | ORAL | Status: AC
  Administered 2021-06-29: 1000 mg via ORAL

## 2021-06-29 MED ORDER — FENTANYL CITRATE (PF) 100 MCG/2ML IJ SOLN
INTRAMUSCULAR | Status: AC
Start: 2021-06-29 — End: ?
  Filled 2021-06-29: qty 2

## 2021-06-29 MED ORDER — BISACODYL 10 MG RE SUPP: 10.0000 mg | Freq: Every day | RECTAL | Status: DC | PRN

## 2021-06-29 MED ORDER — PROPOFOL 500 MG/50ML IV EMUL
INTRAVENOUS | Status: DC | PRN
  Administered 2021-06-29 (×2): 135 ug/kg/min via INTRAVENOUS

## 2021-06-29 MED ORDER — DIPHENHYDRAMINE HCL 50 MG/ML IJ SOLN: 12.5000 mg | Freq: Four times a day (QID) | INTRAMUSCULAR | Status: DC | PRN

## 2021-06-29 MED ORDER — BUPIVACAINE-EPINEPHRINE (PF) 0.5% -1:200000 IJ SOLN
INTRAMUSCULAR | Status: DC | PRN
  Administered 2021-06-29: 20 mL

## 2021-06-29 MED ORDER — SCOPOLAMINE 1 MG/3DAYS TD PT72
1.0000 | MEDICATED_PATCH | TRANSDERMAL | Status: DC
  Administered 2021-06-29: 1.5 mg via TRANSDERMAL

## 2021-06-29 MED ORDER — MIDAZOLAM HCL 5 MG/5ML IJ SOLN
INTRAMUSCULAR | Status: DC | PRN
  Administered 2021-06-29: 2 mg via INTRAVENOUS

## 2021-06-29 MED ORDER — SENNA 8.6 MG PO TABS
1.0000 | ORAL_TABLET | Freq: Two times a day (BID) | ORAL | Status: DC
  Administered 2021-06-29: 8.6 mg via ORAL
  Filled 2021-06-29: qty 1

## 2021-06-29 MED ORDER — SCOPOLAMINE 1 MG/3DAYS TD PT72
MEDICATED_PATCH | TRANSDERMAL | Status: AC
  Filled 2021-06-29: qty 1

## 2021-06-29 MED ORDER — VANCOMYCIN HCL IN DEXTROSE 1-5 GM/200ML-% IV SOLN
INTRAVENOUS | Status: AC
  Filled 2021-06-29: qty 200

## 2021-06-29 SURGICAL SUPPLY — 65 items
APPLIER CLIP 9.375 MED OPEN (MISCELLANEOUS) ×4
BAG DECANTER FOR FLEXI CONT (MISCELLANEOUS) ×4 IMPLANT
BINDER BREAST LRG (GAUZE/BANDAGES/DRESSINGS) ×1 IMPLANT
BINDER BREAST MEDIUM (GAUZE/BANDAGES/DRESSINGS) IMPLANT
BIOPATCH RED 1 DISK 7.0 (GAUZE/BANDAGES/DRESSINGS) ×5 IMPLANT
BLADE SURG 10 STRL SS (BLADE) ×5 IMPLANT
BLADE SURG 15 STRL LF DISP TIS (BLADE) ×3 IMPLANT
BLADE SURG 15 STRL SS (BLADE) ×1
CANISTER SUCT 1200ML W/VALVE (MISCELLANEOUS) ×4 IMPLANT
CHLORAPREP W/TINT 26 (MISCELLANEOUS) ×4 IMPLANT
CLIP APPLIE 9.375 MED OPEN (MISCELLANEOUS) ×3 IMPLANT
COVER BACK TABLE 60X90IN (DRAPES) ×4 IMPLANT
COVER MAYO STAND STRL (DRAPES) ×5 IMPLANT
COVER PROBE W GEL 5X96 (DRAPES) ×5 IMPLANT
DERMABOND ADVANCED (GAUZE/BANDAGES/DRESSINGS) ×2
DERMABOND ADVANCED .7 DNX12 (GAUZE/BANDAGES/DRESSINGS) ×3 IMPLANT
DEVICE DSSCT PLSMBLD 3.0S LGHT (MISCELLANEOUS) ×3 IMPLANT
DRAIN CHANNEL 19F RND (DRAIN) ×5 IMPLANT
DRAPE LAPAROSCOPIC ABDOMINAL (DRAPES) ×4 IMPLANT
DRAPE UTILITY XL STRL (DRAPES) ×4 IMPLANT
DRSG OPSITE POSTOP 4X6 (GAUZE/BANDAGES/DRESSINGS) ×2 IMPLANT
DRSG PAD ABDOMINAL 8X10 ST (GAUZE/BANDAGES/DRESSINGS) ×9 IMPLANT
DRSG TEGADERM 2-3/8X2-3/4 SM (GAUZE/BANDAGES/DRESSINGS) ×5 IMPLANT
ELECT BLADE 4.0 EZ CLEAN MEGAD (MISCELLANEOUS) ×4
ELECT COATED BLADE 2.86 ST (ELECTRODE) ×1 IMPLANT
ELECT REM PT RETURN 9FT ADLT (ELECTROSURGICAL) ×4
ELECTRODE BLDE 4.0 EZ CLN MEGD (MISCELLANEOUS) ×3 IMPLANT
ELECTRODE REM PT RTRN 9FT ADLT (ELECTROSURGICAL) ×3 IMPLANT
EVACUATOR SILICONE 100CC (DRAIN) ×5 IMPLANT
GLOVE BIO SURGEON STRL SZ 6.5 (GLOVE) ×9 IMPLANT
GLOVE BIO SURGEON STRL SZ7.5 (GLOVE) ×4 IMPLANT
GOWN STRL REUS W/ TWL LRG LVL3 (GOWN DISPOSABLE) ×9 IMPLANT
GOWN STRL REUS W/TWL LRG LVL3 (GOWN DISPOSABLE) ×4
IMPL EXPANDER BREAST 455CC (Breast) IMPLANT
IMPLANT BREAST 455CC (Breast) ×2 IMPLANT
IMPLANT EXPANDER BREAST 455CC (Breast) ×6 IMPLANT
IV NS 1000ML (IV SOLUTION) ×1
IV NS 1000ML BAXH (IV SOLUTION) IMPLANT
KIT FILL ASEPTIC TRANSFER (MISCELLANEOUS) ×1 IMPLANT
MESH OVITEX CNTRD PRS 10.5X20 (Tissue) ×2 IMPLANT
NDL HYPO 25X1 1.5 SAFETY (NEEDLE) ×3 IMPLANT
NEEDLE HYPO 25X1 1.5 SAFETY (NEEDLE) ×4 IMPLANT
NS IRRIG 1000ML POUR BTL (IV SOLUTION) ×4 IMPLANT
PACK BASIN DAY SURGERY FS (CUSTOM PROCEDURE TRAY) ×4 IMPLANT
PENCIL SMOKE EVACUATOR (MISCELLANEOUS) ×4 IMPLANT
PIN SAFETY STERILE (MISCELLANEOUS) ×4 IMPLANT
PLASMABLADE 3.0S W/LIGHT (MISCELLANEOUS) ×4
SLEEVE SCD COMPRESS KNEE MED (STOCKING) ×4 IMPLANT
SPONGE T-LAP 18X18 ~~LOC~~+RFID (SPONGE) ×8 IMPLANT
STRIP SUTURE WOUND CLOSURE 1/2 (MISCELLANEOUS) ×2 IMPLANT
SUT MNCRL AB 4-0 PS2 18 (SUTURE) ×5 IMPLANT
SUT MON AB 3-0 SH 27 (SUTURE) ×2
SUT MON AB 3-0 SH27 (SUTURE) ×3 IMPLANT
SUT PDS AB 2-0 CT2 27 (SUTURE) ×8 IMPLANT
SUT SILK 2 0 SH (SUTURE) ×1 IMPLANT
SUT SILK 3 0 PS 1 (SUTURE) ×5 IMPLANT
SUT VICRYL 3-0 CR8 SH (SUTURE) ×4 IMPLANT
SYR BULB IRRIG 60ML STRL (SYRINGE) ×4 IMPLANT
SYR CONTROL 10ML LL (SYRINGE) ×4 IMPLANT
TOWEL GREEN STERILE FF (TOWEL DISPOSABLE) ×8 IMPLANT
TRACER MAGTRACE VIAL (MISCELLANEOUS) ×1 IMPLANT
TRAY FOLEY W/BAG SLVR 14FR LF (SET/KITS/TRAYS/PACK) ×1 IMPLANT
TUBE CONNECTING 20X1/4 (TUBING) ×4 IMPLANT
UNDERPAD 30X36 HEAVY ABSORB (UNDERPADS AND DIAPERS) ×8 IMPLANT
YANKAUER SUCT BULB TIP NO VENT (SUCTIONS) ×4 IMPLANT

## 2021-06-29 NOTE — Anesthesia Postprocedure Evaluation (Signed)
Anesthesia Post Note ? ?Patient: Jackie Miller ? ?Procedure(s) Performed: RIGHT MASTECTOMY WITH SENTINEL LYMPH NODE BIOPSY (Right: Breast) ?LEFT TOTAL MASTECTOMY (Left: Breast) ?BILATERAL BREAST RECONSTRUCTION WITH PLACEMENT OF TISSUE EXPANDER AND DERMAL MATRIX SUBSTITUTE (Bilateral: Breast) ? ?  ? ?Patient location during evaluation: PACU ?Anesthesia Type: General and Regional ?Level of consciousness: awake and alert, oriented and patient cooperative ?Pain management: pain level controlled ?Vital Signs Assessment: post-procedure vital signs reviewed and stable ?Respiratory status: spontaneous breathing, nonlabored ventilation and respiratory function stable ?Cardiovascular status: blood pressure returned to baseline and stable ?Postop Assessment: no apparent nausea or vomiting ?Anesthetic complications: no ? ? ?No notable events documented. ? ?Last Vitals:  ?Vitals:  ? 06/29/21 1510 06/29/21 1515  ?BP: 99/69 101/66  ?Pulse: 76 74  ?Resp: 17 17  ?Temp: 36.6 ?C   ?SpO2: 100% 100%  ?  ?Last Pain:  ?Vitals:  ? 06/29/21 1515  ?TempSrc:   ?PainSc: Asleep  ? ? ?  ?  ?  ?  ?  ?  ? ?Jarome Matin Shristi Scheib ? ? ? ? ?

## 2021-06-29 NOTE — Anesthesia Procedure Notes (Signed)
Procedure Name: Intubation ?Date/Time: 06/29/2021 12:05 PM ?Performed by: Signe Colt, CRNA ?Pre-anesthesia Checklist: Patient identified, Emergency Drugs available, Suction available and Patient being monitored ?Patient Re-evaluated:Patient Re-evaluated prior to induction ?Oxygen Delivery Method: Circle system utilized ?Preoxygenation: Pre-oxygenation with 100% oxygen ?Induction Type: IV induction ?Ventilation: Mask ventilation without difficulty ?Laryngoscope Size: Mac and 3 ?Grade View: Grade I ?Tube type: Oral ?Tube size: 7.0 mm ?Number of attempts: 1 ?Airway Equipment and Method: Stylet and Oral airway ?Placement Confirmation: ETT inserted through vocal cords under direct vision, positive ETCO2 and breath sounds checked- equal and bilateral ?Secured at: 21 cm ?Tube secured with: Tape ?Dental Injury: Teeth and Oropharynx as per pre-operative assessment  ? ? ? ? ?

## 2021-06-29 NOTE — Interval H&P Note (Signed)
History and Physical Interval Note: ? ?06/29/2021 ?11:28 AM ? ?Jackie Miller  has presented today for surgery, with the diagnosis of RIGHT BREAST CANCER.  The various methods of treatment have been discussed with the patient and family. After consideration of risks, benefits and other options for treatment, the patient has consented to  Procedure(s): ?RIGHT MASTECTOMY WITH SENTINEL LYMPH NODE BIOPSY (Right) ?LEFT TOTAL MASTECTOMY (Left) ?BILATERAL BREAST RECONSTRUCTION WITH PLACEMENT OF TISSUE EXPANDER AND FLEX HD (ACELLULAR HYDRATED DERMIS) (Bilateral) as a surgical intervention.  The patient's history has been reviewed, patient examined, no change in status, stable for surgery.  I have reviewed the patient's chart and labs.  Questions were answered to the patient's satisfaction.   ? ? ?[name] ? ? ?

## 2021-06-29 NOTE — Anesthesia Procedure Notes (Signed)
Anesthesia Regional Block: Pectoralis block  ? ?Pre-Anesthetic Checklist: , timeout performed,  Correct Patient, Correct Site, Correct Laterality,  Correct Procedure, Correct Position, site marked,  Risks and benefits discussed,  Pre-op evaluation,  At surgeon's request and post-op pain management ? ?Laterality: Right ? ?Prep: Maximum Sterile Barrier Precautions used, chloraprep     ?  ?Needles:  ?Injection technique: Single-shot ? ?Needle Type: Echogenic Stimulator Needle   ? ? ?Needle Length: 9cm  ?Needle Gauge: 21  ? ? ? ?Additional Needles: ? ? ?Procedures:,,,, ultrasound used (permanent image in chart),,    ?Narrative:  ?Start time: 06/29/2021 10:55 AM ?End time: 06/29/2021 11:05 AM ?Injection made incrementally with aspirations every 5 mL. ? ?Performed by: Personally  ?Anesthesiologist: Roderic Palau, MD ? ? ? ? ?

## 2021-06-29 NOTE — Interval H&P Note (Signed)
History and Physical Interval Note: ? ?06/29/2021 ?11:46 AM ? ?Jackie Miller  has presented today for surgery, with the diagnosis of RIGHT BREAST CANCER.  The various methods of treatment have been discussed with the patient and family. After consideration of risks, benefits and other options for treatment, the patient has consented to  Procedure(s): ?RIGHT MASTECTOMY WITH SENTINEL LYMPH NODE BIOPSY (Right) ?LEFT TOTAL MASTECTOMY (Left) ?BILATERAL BREAST RECONSTRUCTION WITH PLACEMENT OF TISSUE EXPANDER AND FLEX HD (ACELLULAR HYDRATED DERMIS) (Bilateral) as a surgical intervention.  The patient's history has been reviewed, patient examined, no change in status, stable for surgery.  I have reviewed the patient's chart and labs.  Questions were answered to the patient's satisfaction.   ? ? ?Loel Lofty Keyera Hattabaugh ? ? ?

## 2021-06-29 NOTE — Discharge Instructions (Addendum)
INSTRUCTIONS FOR AFTER BREAST SURGERY ? ? ?You will likely have some questions about what to expect following your operation.  The following information will help you and your family understand what to expect when you are discharged from the hospital.  Following these guidelines will help ensure a smooth recovery and reduce risks of complications.  Postoperative instructions include information on: diet, wound care, medications and physical activity. ? ?AFTER SURGERY ?Expect to go home after the procedure.  In some cases, you may need to spend one night in the hospital for observation. Follow up with Dr. Baltazar Apo in the clinic in 7-10 days postoperatively.  ? ?DIET ?Breast surgery does not require a specific diet.  However, the healthier you eat the better your body can start healing. It is important to increasing your protein intake.  This means limiting the foods with sugar and carbohydrates.  Focus on vegetables and some meat.  If you have any liposuction during your procedure be sure to drink water.  If your urine is bright yellow, then it is concentrated, and you need to drink more water.  As a general rule after surgery, you should have 8 ounces of water every hour while awake.  If you find you are persistently nauseated or unable to take in liquids let us know.  NO TOBACCO USE or EXPOSURE.  This will slow your healing process and increase the risk of a wound. ? ?WOUND CARE ?Leave the ACE wrap or binder on for 3 days . Use fragrance free soap.   ?After 3 days you can remove the ACE wrap or binder to shower. Once dry apply ACE wrap, binder or sports bra.  Use a mild soap like Dial, Dove and [place]. ?You may have Topifoam or Lipofoam on.  It is soft and spongy and helps keep you from getting creases if you have liposuction.  This can be removed before the shower and then replaced.  If you need more it is available on [employer] (Lipofoam). ?If you have steri-strips / tape directly attached to your skin leave them in  place. It is OK to get these wet.   ?No baths, pools or hot tubs for four weeks. ?We close your incision to leave the smallest and best-looking scar. No ointment or creams on your incisions until given the go ahead.  Especially not Neosporin (Too many skin reactions with this one).  A few weeks after surgery you can use Mederma and start massaging the scar. ?We ask you to wear your binder or sports bra for the first 6 weeks around the clock, including while sleeping. This provides added comfort and helps reduce the fluid accumulation at the surgery site. ? ?ACTIVITY ?No heavy lifting until cleared by the doctor.  This usually means no more than a half-gallon of milk.  It is OK to walk and climb stairs. In fact, moving your legs is very important to decrease your risk of a blood clot.  It will also help keep you from getting deconditioned.  Every 1 to 2 hours get up and walk for 5 minutes. This will help with a quicker recovery back to normal.  Let pain be your guide so you don't do too much.  This is not the time for spring cleaning and don't plan on taking care of anyone else.  This time is for you to recover,  ?You will be more comfortable if you sleep and rest with your head elevated either with a few pillows under you or in  a recliner.  No stomach sleeping for a three months. ? ?WORK ?Everyone returns to work at different times. As a rough guide, most people take at least 1 - 2 weeks off prior to returning to work. If you need documentation for your job, bring the forms to your postoperative follow up visit. ? ?DRIVING ?Arrange for someone to bring you home from the hospital.  You may be able to drive a few days after surgery but not while taking any narcotics or valium. ? ?BOWEL MOVEMENTS ?Constipation can occur after anesthesia and while taking pain medication.  It is important to stay ahead for your comfort.  We recommend taking Milk of Magnesia (2 tablespoons; twice a day) while taking the pain  pills. ? ?MEDICATIONS You may be prescribed should start after surgery ?At your preoperative visit for you history and physical you may have been given the following medications: ?An antibiotic: Start this medication when you get home and take according to the instructions on the bottle. ?Zofran 4 mg:  This is to treat nausea and vomiting.  You can take this every 6 hours as needed and only if needed. ?Valium 2 mg: This is for muscle tightness if you have an implant or expander. This will help relax your muscle which also helps with pain control.  This can be taken every 12 hours as needed. Don't drive after taking this medication. *Last dose at 4 am 06/30/21 ?Norco (hydrocodone/acetaminophen) 5/325 mg:  This is only to be used after you have taken the motrin or the tylenol. Every 8 hours as needed. *Last dose at 8 am 06/30/21 ?HOLD your tamoxifen for 2 weeks postoperatively. Take as directed on the label after 2 weeks.  ? ? ?Over the counter Medication to take: ?Ibuprofen (Motrin) 600 mg:  Take this every 6 hours.  If you have additional pain then take 500 mg of the tylenol every 8 hours.  Only take the Norco after you have tried these two. ?Miralax or stool softener of choice: Take this according to the bottle if you take the Norco. ? ?WHEN TO CALL ?Call your surgeon's office if any of the following occur: ?Fever 101 degrees F or greater ?Excessive bleeding or fluid from the incision site. ?Pain that increases over time without aid from the medications ?Redness, warmth, or pus draining from incision sites ?Persistent nausea or inability to take in liquids ?Severe misshapen area that underwent the operation. ? ?Here are some resources: ? ?Plastic surgery website: https://www.plasticsurgery.org/for-medical-professionals/education-and-resources/publications/breast-reconstruction-magazine ?Breast Reconstruction Awareness Campaign:  [employer].co.nz ?Plastic surgery Implant information:   https://www.plasticsurgery.org/patient-safety/breast-implant-safety ? ?About my Jackson-Pratt Bulb Drain ? ?What is a Jackson-Pratt bulb? ?A Jackson-Pratt is a soft, round device used to collect drainage. It is connected to a long, thin drainage catheter, which is held in place by one or two small stiches near your surgical incision site. When the bulb is squeezed, it forms a vacuum, forcing the drainage to empty into the bulb. ? ?Emptying the Jackson-Pratt bulb- ?To empty the bulb: ?1. Release the plug on the top of the bulb. ?2. Pour the bulb's contents into a measuring container which your nurse will provide. ?3. Record the time emptied and amount of drainage. Empty the drain(s) as often as your     doctor or nurse recommends. ? ?Date             Time                  Amount (Drain 1)  Amount (Drain 2) ? ?_____________________________________________________________________________________ ? ?_____________________________________________________________________________________ ? ?_____________________________________________________________________________________ ? ?_____________________________________________________________________________________ ? ?_____________________________________________________________________________________ ? ?_____________________________________________________________________________________ ? ?_____________________________________________________________________________________ ? ?_____________________________________________________________________________________ ? ?Squeezing the Jackson-Pratt Bulb- ?To squeeze the bulb: ?1. Make sure the plug at the top of the bulb is open. ?2. Squeeze the bulb tightly in your fist. You will hear air squeezing from the bulb. ?3. Replace the plug while the bulb is squeezed. ?4. Use a safety pin to attach the bulb to your clothing. This will keep the catheter from     pulling at the bulb insertion site. ? ?When to call your  doctor- ?Call your doctor if: ?Drain site becomes red, swollen or hot. ?You have a fever greater than 101 degrees F. ?There is oozing at the drain site. ?Drain falls out (apply a guaze bandage over the drain hole and secure it with tape).

## 2021-06-29 NOTE — Anesthesia Preprocedure Evaluation (Addendum)
Anesthesia Evaluation  ?Patient identified by MRN, date of birth, ID band ?Patient awake ? ? ? ?Reviewed: ?Allergy & Precautions, H&P , NPO status , Patient's Chart, lab work & pertinent test results ? ?History of Anesthesia Complications ?(+) PONV and history of anesthetic complications ? ?Airway ?Mallampati: II ? ?TM Distance: >3 FB ?Neck ROM: Full ? ? ? Dental ?no notable dental hx. ?(+) Teeth Intact, Dental Advisory Given ?  ?Pulmonary ?asthma ,  ?  ?Pulmonary exam normal ?breath sounds clear to auscultation ? ? ? ? ? ? Cardiovascular ?negative cardio ROS ? ? ?Rhythm:Regular Rate:Normal ? ? ?  ?Neuro/Psych ? Headaches, negative psych ROS  ? GI/Hepatic ?negative GI ROS, Neg liver ROS,   ?Endo/Other  ?negative endocrine ROS ? Renal/GU ?negative Renal ROS  ?negative genitourinary ?  ?Musculoskeletal ? ? Abdominal ?  ?Peds ? Hematology ?negative hematology ROS ?(+)   ?Anesthesia Other Findings ? ? Reproductive/Obstetrics ?negative OB ROS ? ?  ? ? ? ? ? ? ? ? ? ? ? ? ? ?  ?  ? ? ? ? ? ? ? ?Anesthesia Physical ?Anesthesia Plan ? ?ASA: 2 ? ?Anesthesia Plan: General  ? ?Post-op Pain Management: Regional block* and Tylenol PO (pre-op)*  ? ?Induction: Intravenous ? ?PONV Risk Score and Plan: 4 or greater and Ondansetron, Dexamethasone, Propofol infusion, TIVA and Midazolam ? ?Airway Management Planned: Oral ETT ? ?Additional Equipment:  ? ?Intra-op Plan:  ? ?Post-operative Plan: Extubation in OR ? ?Informed Consent: I have reviewed the patients History and Physical, chart, labs and discussed the procedure including the risks, benefits and alternatives for the proposed anesthesia with the patient or authorized representative who has indicated his/her understanding and acceptance.  ? ? ? ?Dental advisory given ? ?Plan Discussed with: CRNA ? ?Anesthesia Plan Comments:   ? ? ? ? ? ? ?Anesthesia Quick Evaluation ? ?

## 2021-06-29 NOTE — Op Note (Addendum)
Op report    DATE OF OPERATION:  06/29/2021  LOCATION: [place]  SURGICAL DIVISION: Plastic Surgery  PREOPERATIVE DIAGNOSES:  1. Upper Outer Right breast cancer.    POSTOPERATIVE DIAGNOSES:  1. Upper Outer Right breast cancer.   PROCEDURE:  1. Bilateral immediate breast reconstruction with placement of Ovitex and tissue expanders.  SURGEON: Rooney Gladwin Sanger Jaleigha Deane, DO  ASSISTANT: Donnamarie Rossetti, PA  ANESTHESIA:  General.   COMPLICATIONS: None.   IMPLANTS: Left - Mentor 455 cc. Ref #[id number].  Serial Number [id number], 150 cc of injectable saline placed in the expander. Right - Mentor 455 cc. Ref #[id number].  Serial Number [id number], 150 cc of injectable saline placed in the expander. Ovitex 10 x 20 cm  INDICATIONS FOR PROCEDURE:  The patient, Jackie Miller, is a 41 y.o. female born on 07-Apr-1980, is here for  immediate first stage breast reconstruction with placement of bilateral tissue expander and Acellular dermal matrix. MRN: [id number]  CONSENT:  Informed consent was obtained directly from the patient. Risks, benefits and alternatives were fully discussed. Specific risks including but not limited to bleeding, infection, hematoma, seroma, scarring, pain, implant infection, implant extrusion, capsular contracture, asymmetry, wound healing problems, and need for further surgery were all discussed. The patient did have an ample opportunity to have her questions answered to her satisfaction.   DESCRIPTION OF PROCEDURE:  The patient was taken to the operating room by the general surgery team. SCDs were placed and IV antibiotics were given. The patient's chest was prepped and draped in a sterile fashion. A time out was performed and the implants to be used were identified.  Bilateral mastectomies were performed.  Once the general surgery team had completed their portion of the case the patient was rendered to the plastic and reconstructive surgery  team.  Left:  The pectoralis major muscle was lifted from the chest wall with release of the lateral edge and lateral inframammary fold.  The pocket was irrigated with antibiotic solution and hemostasis was achieved with electrocautery.  The Ovitex was then prepared according to the manufacture guidelines.  The Ovitex was then sutured to the inferior and lateral edge of the inframammary fold with 2-0 PDS starting with an interrupted stitch and then a running stitch.  The lateral portion was sutured to with interrupted sutures after the expander was placed.  The expander was prepared according to the manufacture guidelines, the air evacuated and then it was placed under the Ovitex and pectoralis major muscle.  The inferior and lateral tabs were used to secure the expander to the chest wall with 2-0 PDS.  The drain was placed at the inframammary fold over the ADM and secured to the skin with 3-0 Silk.  The deep layers were closed with 3-0 Monocryl.  The skin was closed with 4-0 Monocryl and then dermabond was applied.    Right:  The pectoralis major muscle was lifted from the chest wall with release of the lateral edge and lateral inframammary fold.  The pocket was irrigated with antibiotic solution and hemostasis was achieved with electrocautery.  The Ovitex was then prepared according to the manufacture guidelines.  The Ovitex was then sutured to the inferior and lateral edge of the inframammary fold with 2-0 PDS starting with an interrupted stitch and then a running stitch.  The lateral portion was sutured to with interrupted sutures after the expander was placed.  The expander was prepared according to the manufacture guidelines, the air evacuated and then it was placed  under the Ovitex and pectoralis major muscle.  The inferior and lateral tabs were used to secure the expander to the chest wall with 2-0 PDS.  The drain was placed at the inframammary fold over the ADM and secured to the skin with 3-0 Silk.   The deep layers were closed with 3-0 Monocryl.  The skin was closed with 4-0 Monocryl and then dermabond was applied. The ABDs and breast binder were placed.  The patient tolerated the procedure well and there were no complications.  The patient was allowed to wake from anesthesia and taken to the recovery room in satisfactory condition.   The advanced practice practitioner (APP) assisted throughout the case.  The APP was essential in retraction and counter traction when needed to make the case progress smoothly.  This retraction and assistance made it possible to see the tissue plans for the procedure.  The assistance was needed for blood control, tissue re-approximation and assisted with closure of the incision site.

## 2021-06-29 NOTE — Op Note (Signed)
06/29/2021 ? ?2:04 PM ? ?PATIENT:  Priscille Kluver  41 y.o. female ? ?PRE-OPERATIVE DIAGNOSIS:  RIGHT BREAST CANCER ? ?POST-OPERATIVE DIAGNOSIS:  RIGHT BREAST CANCER ? ?PROCEDURE:  Procedure(s): ?RIGHT MASTECTOMY WITH DEEP RIGHT AXILLARY SENTINEL LYMPH NODE BIOPSY (Right) ?LEFT PROPHYLACTIC MASTECTOMY (Left) ? ?SURGEON:  Surgeon(s) and Role: ?Panel 1: ?   Jovita Kussmaul, MD - Primary ? ?PHYSICIAN ASSISTANT:  ? ?ASSISTANTS: none  ? ?ANESTHESIA:   local and general ? ?EBL:  35 mL  ? ?BLOOD ADMINISTERED:none ? ?DRAINS: none  ? ?LOCAL MEDICATIONS USED:  MARCAINE    ? ?SPECIMEN:  Source of Specimen:  Left mastectomy, right mastectomy and sentinel node x 2 ? ?DISPOSITION OF SPECIMEN:  PATHOLOGY ? ?COUNTS:  YES ? ?TOURNIQUET:  * No tourniquets in log * ? ?DICTATION: .Dragon Dictation ? ?After informed consent was obtained the patient was brought to the operating room and placed in the supine position on the operating room table.  After adequate induction of general anesthesia the patient's bilateral chest, breast, and axillary areas were prepped with ChloraPrep, allowed to dry, and draped in usual sterile manner.  An appropriate timeout was performed.  At this point, 2 cc of iron oxide were injected in the subareolar plexus on the right and the breast was massaged for 5 minutes.  Attention was first turned to the left breast.  An elliptical incision was made around the nipple and areola complex in order to spare the skin.  The incision was carried through the skin and subcutaneous tissue sharply with the PlasmaBlade.  Breast hooks were used to elevate the skin flaps anteriorly towards the ceiling.  Thin skin flaps were then created by dissecting between the breast tissue and the subcutaneous fat.  This dissection was carried circumferentially all the way to the chest wall.  Next the breast was removed from the pectoralis muscle with the pectoralis fascia.  Once the entire breast was then removed oriented with a stitch on the  lateral skin and sent up to pathology for further evaluation.  Hemostasis was achieved using the PlasmaBlade.  The cavity was packed with a moistened lap sponge.  Attention was then turned to the right breast.  A similar elliptical incision was made around the nipple and areola complex in order to spare the skin.  The incision was carried through the skin and subcutaneous tissue sharply with the PlasmaBlade.  Breast hooks were used to elevate the skin flaps anteriorly towards the ceiling.  Thin skin flaps were then created by dissecting between the breast tissue and the subcutaneous fat and skin.  This dissection was carried circumferentially all the way to the chest wall.  Next the breast was removed from the pectoralis muscle with the pectoralis fashion.  Once this was accomplished the entire right breast was removed from the patient.  It was marked with a stitch on the lateral skin and sent to pathology for further evaluation.  The mag trace was used to identify a signal in the deep right axillary space.  Dissection was carried out with the PlasmaBlade under the direction of the mag trace until 2 lymph nodes were identified.  These were excised sharply with the PlasmaBlade and the surrounding small vessels and lymphatics were controlled with clips.  These were sent as sentinel nodes numbers 1 and 2.  No other hot or palpable nodes were identified in the right axilla.  Hemostasis was achieved using the PlasmaBlade.  The cavity was then packed with a moistened lap  sponge.  The patient was then turned over to Dr. Marla Roe for the reconstruction.  Her portion will be dictated separately.  The patient was in stable condition.  All needle sponge and instrument counts were correct. ? ?PLAN OF CARE: Admit for overnight observation ? ?PATIENT DISPOSITION:  PACU - hemodynamically stable. ?  ?Delay start of Pharmacological VTE agent (>24hrs) due to surgical blood loss or risk of bleeding: no ? ?

## 2021-06-29 NOTE — Progress Notes (Signed)
Assisted Dr. Oren Bracket with right, pectoralis, ultrasound guided block. Side rails up, monitors on throughout procedure. See vital signs in flow sheet. Tolerated Procedure well. ?

## 2021-06-29 NOTE — Transfer of Care (Signed)
Immediate Anesthesia Transfer of Care Note ? ?Patient: Jackie Miller ? ?Procedure(s) Performed: RIGHT MASTECTOMY WITH SENTINEL LYMPH NODE BIOPSY (Right: Breast) ?LEFT TOTAL MASTECTOMY (Left: Breast) ?BILATERAL BREAST RECONSTRUCTION WITH PLACEMENT OF TISSUE EXPANDER AND DERMAL MATRIX SUBSTITUTE (Bilateral: Breast) ? ?Patient Location: PACU ? ?Anesthesia Type:General ? ?Level of Consciousness: drowsy and patient cooperative ? ?Airway & Oxygen Therapy: Patient Spontanous Breathing and Patient connected to face mask oxygen ? ?Post-op Assessment: Report given to RN and Post -op Vital signs reviewed and stable ? ?Post vital signs: Reviewed and stable ? ?Last Vitals:  ?Vitals Value Taken Time  ?BP 99/69 06/29/21 1510  ?Temp    ?Pulse 73 06/29/21 1511  ?Resp 17 06/29/21 1511  ?SpO2 100 % 06/29/21 1511  ?Vitals shown include unvalidated device data. ? ?Last Pain:  ?Vitals:  ? 06/29/21 0920  ?TempSrc: Oral  ?PainSc: 0-No pain  ?   ? ?Patients Stated Pain Goal: 4 (06/29/21 0920) ? ?Complications: No notable events documented. ?

## 2021-06-29 NOTE — H&P (Signed)
?PROVIDER: Landry Corporal, MD ? ?MRN: [id number] ?DOB: January 22, 1981 ?Subjective  ? ?Chief Complaint: Breast Cancer ? ? ?History of Present Illness: ?Jackie Miller is a 41 y.o. female who is seen today as an office consultation for evaluation of Breast Cancer ?.  ? ?We are asked to see the patient in consultation by Dr. Lindi Adie to evaluate her for a new right breast cancer. The patient is a 41 year old white female who recently went for a routine screening mammogram. She did start early because of her strong family history. The mammogram showed 7 cm of calcifications and a 1.6 cm mass in the upper outer quadrant of the right breast. Both were biopsied and came back as grade 2 invasive ductal cancer that was ER and PR positive and HER2 negative with a Ki-67 of 2%. She does have very dense breast tissue. She does not smoke. She is otherwise in good health. ? ?Review of Systems: ?A complete review of systems was obtained from the patient. I have reviewed this information and discussed as appropriate with the patient. See HPI as well for other ROS. ? ?ROS  ? ?Medical History: ?Past Medical History:  ?Diagnosis Date  ? Asthma without status asthmaticus, unspecified  ? Polycystic disease, ovaries  ? ?Patient Active Problem List  ?Diagnosis  ? Polycystic disease, ovaries  ? Asthma  ? Malignant neoplasm of upper-outer quadrant of right breast in female, estrogen receptor positive (CMS-HCC)  ? Tendency toward bleeding easily (CMS-HCC)  ? Easy bruising  ? Chondromalacia of right patella  ? ?Past Surgical History:  ?Procedure Laterality Date  ? COLONOSCOPY 01/01/2013  ?[facility] (Mother): CBF 12/2015; See msg 09/08/2016; Postponed until 02/2017 (dw); Recall Ltr mailed 02/10/2017 (dh)  ? lasik surgery N/A  ?Unknown date  ? TONSILLECTOMY  ? Tubes in ears  ? ? ?Allergies  ?Allergen Reactions  ? Amoxicillin Swelling  ? Bacitracin Itching  ? Doxycycline Other (See Comments)  ?Muscloskeletal pain ? ? ?Current Outpatient Medications on File Prior  to Visit  ?Medication Sig Dispense Refill  ? albuterol 90 mcg/actuation inhaler Ventolin HFA 90 mcg/actuation aerosol inhaler  ? montelukast (SINGULAIR) 10 mg tablet Take 10 mg by mouth once daily as needed  ? predniSONE (DELTASONE) 10 mg tablet pack . 21 tablet 0  ? ?No current facility-administered medications on file prior to visit.  ? ?Family History  ?Problem Relation Age of Onset  ? Deep vein thrombosis (DVT or abnormal blood clot formation) Mother  ? Colon cancer Mother  ? High blood pressure (Hypertension) Father  ? No Known Problems Brother  ? Breast cancer Maternal Grandmother  ? Lung cancer Paternal Grandfather  ? Skin cancer Paternal Grandfather  ? ? ?Social History  ? ?Tobacco Use  ?Smoking Status Never  ?Smokeless Tobacco Never  ? ? ?Social History  ? ?Socioeconomic History  ? Marital status: Single  ?Tobacco Use  ? Smoking status: Never  ? Smokeless tobacco: Never  ?Vaping Use  ? Vaping Use: Never used  ?Substance and Sexual Activity  ? Alcohol use: No  ? Drug use: Never  ? Sexual activity: Defer  ? ?Objective:  ?There were no vitals filed for this visit.  ?There is no height or weight on file to calculate BMI. ? ?Physical Exam ?Vitals reviewed.  ?Constitutional:  ?General: She is not in acute distress. ?Appearance: Normal appearance.  ?HENT:  ?Head: Normocephalic and atraumatic.  ?Right Ear: External ear normal.  ?Left Ear: External ear normal.  ?Nose: Nose normal.  ?Mouth/Throat:  ?Mouth:  Mucous membranes are moist.  ?Pharynx: Oropharynx is clear.  ?Eyes:  ?General: No scleral icterus. ?Extraocular Movements: Extraocular movements intact.  ?Conjunctiva/sclera: Conjunctivae normal.  ?Pupils: Pupils are equal, round, and reactive to light.  ?Cardiovascular:  ?Rate and Rhythm: Normal rate and regular rhythm.  ?Pulses: Normal pulses.  ?Heart sounds: Normal heart sounds.  ?Pulmonary:  ?Effort: Pulmonary effort is normal. No respiratory distress.  ?Breath sounds: Normal breath sounds.  ?Abdominal:   ?General: Bowel sounds are normal.  ?Palpations: Abdomen is soft.  ?Tenderness: There is no abdominal tenderness.  ?Musculoskeletal:  ?General: No swelling, tenderness or deformity. Normal range of motion.  ?Cervical back: Normal range of motion and neck supple.  ?Skin: ?General: Skin is warm and dry.  ?Coloration: Skin is not jaundiced.  ?Neurological:  ?General: No focal deficit present.  ?Mental Status: She is alert and oriented to person, place, and time.  ?Psychiatric:  ?Mood and Affect: Mood normal.  ?Behavior: Behavior normal.  ? ? ? ?Breast: There is symmetric dense breast tissue bilaterally. There is no dominant palpable mass in either breast. There is no palpable axillary, supraclavicular, or cervical lymphadenopathy. ? ?Labs, Imaging and Diagnostic Testing: ? ?Assessment and Plan:  ? ?Diagnoses and all orders for this visit: ? ?Malignant neoplasm of upper-outer quadrant of right breast in female, estrogen receptor positive (CMS-HCC) ?- Ambulatory Referral to Oncology-Medical ?- Ambulatory Referral to Physical Therapy ?- Ambulatory Referral to Plastic Surgery ?- Ambulatory Referral to Radiation Oncology ?- [facility] Case Posting Request; Future ? ? ? ?The patient appears to have a large area of invasive ductal cancer in the upper outer quadrant of the right breast. Because of the size of the area involved my recommendation would be for mastectomy. She would also be a candidate for sentinel node biopsy. She would be interested in reconstruction so we will refer her to plastic surgery with Dr. Marla Roe. I have discussed with her in detail the risks and benefits of the operation as well as some of the technical aspects and she understands and wishes to proceed. We will get an MRI study because of the density of the breast to make sure were not missing anything else. She will meet with medical and radiation oncology to discuss adjuvant therapy as well as physical therapy for preoperative lymphedema testing. ?

## 2021-06-30 DIAGNOSIS — C50411 Malignant neoplasm of upper-outer quadrant of right female breast: Secondary | ICD-10-CM | POA: Diagnosis not present

## 2021-06-30 NOTE — Discharge Summary (Signed)
Physician Discharge Summary  ?Patient ID: ?Jackie Miller ?MRN: [id number] ?DOB/AGE: Jun 06, 1980 41 y.o. ? ?Admit date: 06/29/2021 ?Discharge date: 06/30/2021 ? ?Admission Diagnoses: Right Breast Cancer  ? ?Discharge Diagnoses:  ?Principal Problem: ?  Breast cancer ([place]) ? ? ?Discharged Condition: good ? ?Hospital Course: Patient is a 41 year old female postop day 1 from bilateral mastectomy with Dr. Marlou Starks and bilateral immediate breast reconstruction with placement of Ovitex tissue expanders with Dr. Marla Roe.  This morning she says she is doing well overall.  She denies any acute events overnight.  She reports that her pain has been controlled with medications.  She denies fevers or chills.  She denies any other issues. ? ?Consults: None ? ?Significant Diagnostic Studies: N/A ? ?Treatments: analgesia: acetaminophen, hydrocodone-acetaminophen, ibuprofen ? ?Discharge Exam: ?Blood pressure 114/68, pulse 71, temperature 98.5 ?F (36.9 ?C), resp. rate 16, height '5\' 7"'$  (1.702 m), weight 67.6 kg, last menstrual period 06/08/2021, SpO2 100 %. ? ?General appearance: alert, cooperative, and no distress ?Resp: unlabored breathing, no respiratory distress ?Breasts: Bilateral breast incisions intact with steri strips, honeycomb dressings is clean,dry and intact. No erythema or bruising noted bilaterally. Breasts are soft bilaterally. No fluid collections noted. Drains are in place with sutures. Approximately 10 cc of serosanguineous drainage in the left drain and approximately 5 cc of serosanguineous drainage in the right drain. ? ? ?Disposition: Discharge disposition: 01-Home or Self Care ? ? ? ? ? ? ?Discharge Instructions   ? ? (HEART FAILURE PATIENTS) Call MD:  Anytime you have any of the following symptoms: 1) 3 pound weight gain in 24 hours or 5 pounds in 1 week 2) shortness of breath, with or without a dry hacking cough 3) swelling in the hands, feet or stomach 4) if you have to sleep on extra pillows at night in order to  breathe.   Complete by: As directed ?  ? Call MD for:  difficulty breathing, headache or visual disturbances   Complete by: As directed ?  ? Call MD for:  extreme fatigue   Complete by: As directed ?  ? Call MD for:  hives   Complete by: As directed ?  ? Call MD for:  persistant dizziness or light-headedness   Complete by: As directed ?  ? Call MD for:  persistant nausea and vomiting   Complete by: As directed ?  ? Call MD for:  redness, tenderness, or signs of infection (pain, swelling, redness, odor or green/yellow discharge around incision site)   Complete by: As directed ?  ? Call MD for:  severe uncontrolled pain   Complete by: As directed ?  ? Call MD for:  temperature >100.4   Complete by: As directed ?  ? Diet - low sodium heart healthy   Complete by: As directed ?  ? Increase activity slowly   Complete by: As directed ?  ? ?  ? ?Allergies as of 06/30/2021   ? ?   Reactions  ? Bacitracin Itching, Rash  ? Amoxicillin Swelling  ? Doxycycline Other (See Comments)  ? Muscloskeletal pain  ? ?  ? ?  ?Medication List  ?  ? ?TAKE these medications   ? ?diazepam 2 MG tablet ?Commonly known as: Valium ?Take 1 tablet (2 mg total) by mouth every 12 (twelve) hours as needed for muscle spasms. ?  ?montelukast 10 MG tablet ?Commonly known as: SINGULAIR ?Take 10 mg by mouth at bedtime. ?  ?ondansetron 4 MG disintegrating tablet ?Commonly known as: ZOFRAN-ODT ?Take 1 tablet (  4 mg total) by mouth every 8 (eight) hours as needed for nausea or vomiting. ?  ?ProAir HFA 108 (90 Base) MCG/ACT inhaler ?Generic drug: albuterol ?Inhale 2 puffs into the lungs every 6 (six) hours as needed. ?  ?tamoxifen 20 MG tablet ?Commonly known as: NOLVADEX ?Take 1 tablet (20 mg total) by mouth daily. ?  ? ?  ? ? Follow-up Information   ? ? Dillingham, Loel Lofty, DO Follow up in 10 day(s).   ?Specialty: Plastic Surgery ?Contact information: ?[address] ?[address] [place] [address] ?[phone] ? ? ?  ?  ? ?  ?  ? ?  ? ? ?[facility] ?[place], [place] [address] ?[phone] ? ?Signed: ?Clance Boll ?06/30/2021, 7:55 AM ?  ?

## 2021-06-30 NOTE — Addendum Note (Signed)
Addendum  created 06/30/21 6060 by Maryella Shivers, CRNA  ? Charge Capture section accepted  ?  ?

## 2021-07-01 ENCOUNTER — Encounter (HOSPITAL_BASED_OUTPATIENT_CLINIC_OR_DEPARTMENT_OTHER): Payer: Self-pay | Admitting: General Surgery

## 2021-07-01 LAB — SURGICAL PATHOLOGY

## 2021-07-02 NOTE — Progress Notes (Signed)
Patient Care Team: McLean-Scocuzza, Nino Glow, MD as PCP - General (Internal Medicine) Mauro Kaufmann, RN as Oncology Nurse Navigator Rockwell Germany, RN as Oncology Nurse Navigator Jovita Kussmaul, MD as Consulting Physician (General Surgery) Nicholas Lose, MD as Consulting Physician (Hematology and Oncology) Gery Pray, MD as Consulting Physician (Radiation Oncology)  DIAGNOSIS: No diagnosis found.  SUMMARY OF ONCOLOGIC HISTORY: Oncology History  Malignant neoplasm of upper-outer quadrant of right breast in female, estrogen receptor positive ([place])  04/24/2021 Initial Diagnosis   Screening mammogram detected right breast calcifications and distortion, ultrasound UOQ 10:00: 1.6 cm biopsy grade 2 IDC ER 70%, PR greater than 95%, HER2 negative, Ki-67 2%, calcs spanned 7 cm: Biopsy intermediate grade DCIS ER 95%, PR 95%   05/06/2021 Cancer Staging   Staging form: Breast, AJCC 8th Edition - Clinical stage from 05/06/2021: Stage IA (cT1b, cN0, cM0, G2, ER+, PR+, HER2-) - Signed by Nicholas Lose, MD on 05/06/2021 Stage prefix: Initial diagnosis Histologic grading system: 3 grade system      CHIEF COMPLIANT: Follow-up after surgery  INTERVAL HISTORY: Jackie Miller is a 41 y.o. female is here because of recent diagnosis of right Breast cancer.  She presents to the clinic today for a follow-up.   ALLERGIES:  is allergic to bacitracin, amoxicillin, and doxycycline.  MEDICATIONS:  Current Outpatient Medications  Medication Sig Dispense Refill   diazepam (VALIUM) 2 MG tablet Take 1 tablet (2 mg total) by mouth every 12 (twelve) hours as needed for muscle spasms. 20 tablet 0   montelukast (SINGULAIR) 10 MG tablet Take 10 mg by mouth at bedtime.     ondansetron (ZOFRAN-ODT) 4 MG disintegrating tablet Take 1 tablet (4 mg total) by mouth every 8 (eight) hours as needed for nausea or vomiting. 20 tablet 0   PROAIR HFA 108 (90 Base) MCG/ACT inhaler Inhale 2 puffs into the lungs every 6 (six)  hours as needed. 18 g 1   tamoxifen (NOLVADEX) 20 MG tablet Take 1 tablet (20 mg total) by mouth daily. 30 tablet 1   No current facility-administered medications for this visit.    PHYSICAL EXAMINATION: ECOG PERFORMANCE STATUS: {CHL ONC ECOG [phone]}  There were no vitals filed for this visit. There were no vitals filed for this visit.  BREAST:*** No palpable masses or nodules in either right or left breasts. No palpable axillary supraclavicular or infraclavicular adenopathy no breast tenderness or nipple discharge. (exam performed in the presence of a chaperone)  LABORATORY DATA:  I have reviewed the data as listed    Latest Ref Rng & Units 05/06/2021   12:24 PM 10/04/2019    4:23 PM 08/29/2018    8:20 AM  CMP  Glucose 70 - 99 mg/dL 91   90   80    BUN 6 - 20 mg/dL _0 Creatinine 0.44 - 1.00 mg/dL 0.73   0.87   0.90    Sodium 135 - 145 mmol/L 140   140   142    Potassium 3.5 - 5.1 mmol/L 4.4   4.3   4.6    Chloride 98 - 111 mmol/L 107   107   107    CO2 22 - 32 mmol/L _1 Calcium 8.9 - 10.3 mg/dL 9.7   9.4   9.3    Total Protein 6.5 - 8.1 g/dL 6.7   6.6   6.2    Total  Bilirubin 0.3 - 1.2 mg/dL 0.8   0.4   0.5    Alkaline Phos 38 - 126 U/L 43   40   45    AST 15 - 41 U/L _0 ALT 0 - 44 U/L _1 Lab Results  Component Value Date   WBC 7.8 05/06/2021   HGB 13.8 05/06/2021   HCT 40.9 05/06/2021   MCV 87.0 05/06/2021   PLT 312 05/06/2021   NEUTROABS 5.5 05/06/2021    ASSESSMENT & PLAN:  No problem-specific Assessment & Plan notes found for this encounter.    No orders of the defined types were placed in this encounter.  The patient has a good understanding of the overall plan. she agrees with it. she will call with any problems that may develop before the next visit here. Total time spent: 30 mins including face to face time and time spent for planning, charting and co-ordination of care   Suzzette Righter,  [place] 07/02/21  I Gardiner Coins am scribing for Dr. Lindi Adie  ***

## 2021-07-03 ENCOUNTER — Encounter: Payer: Self-pay | Admitting: *Deleted

## 2021-07-06 NOTE — Progress Notes (Unsigned)
Patient is a 41 year old female history of right-sided breast cancer.  Patient underwent bilateral immediate breast reconstruction with placement of Ovitex and tissue expanders with Dr. Marla Roe on 06/29/2021.  Today she is postop day 8.  Patient presents for postoperative follow-up.  Today,

## 2021-07-07 ENCOUNTER — Ambulatory Visit (INDEPENDENT_AMBULATORY_CARE_PROVIDER_SITE_OTHER): Payer: 59 | Admitting: Physician Assistant

## 2021-07-07 DIAGNOSIS — Z9889 Other specified postprocedural states: Secondary | ICD-10-CM

## 2021-07-07 NOTE — Progress Notes (Signed)
Patient is a 41 year old female with PMH of right-sided breast cancer s/p bilateral mastectomy with immediate reconstruction using tissue expanders and Flex HD performed 06/29/2021 by Dr. Marlou Starks and Dr. Marla Roe who presents to clinic for postoperative follow-up.  Reviewed operative report and 150 cc was placed into each of the 455 cc expanders.  She was discharged from the hospital the following morning feeling well.  Today, patient is doing well.  She states that her left drain tube had rubbed against her anterior lateral chest wall causing mild bleeding and discomfort, but she has since used Neosporin and gauze which has helped.  Her mastectomy sites are relatively numb.  She denies any redness or fevers.  She reports approximately 40 cc/day drainage from left drain and 60 cc/day drainage from right drain.  These volumes have been consistently downtrending since surgery.  Denies any change in character of drainage.  She has been managing okay with ibuprofen during the day and oxycodone only at night to help her sleep.  Physical exam is entirely reassuring.  No subcutaneous fluid collections appreciated on exam.  No significant ecchymoses noted.  JP drains intact and functional.  10 cc serosanguineous drainage in bulbs bilaterally.  No cellulitic changes.  Recommending Vaseline rather than Neosporin around drain tube insertion sites.  She complained of discomfort in the breast binder, informed patient that it is okay to transition to the compressive bra provided to her by [facility] [employer] [facility].  Continue with activity modifications.  Return next week for likely drain removal and expander fill.  She will call clinic should she have any questions or concerns in interim.

## 2021-07-09 ENCOUNTER — Other Ambulatory Visit: Payer: Self-pay

## 2021-07-09 ENCOUNTER — Other Ambulatory Visit: Payer: Self-pay | Admitting: Hematology and Oncology

## 2021-07-09 ENCOUNTER — Encounter: Payer: Self-pay | Admitting: *Deleted

## 2021-07-09 ENCOUNTER — Inpatient Hospital Stay: Payer: 59 | Attending: Hematology and Oncology | Admitting: Hematology and Oncology

## 2021-07-09 DIAGNOSIS — Z170 Estrogen receptor positive status [ER+]: Secondary | ICD-10-CM | POA: Diagnosis not present

## 2021-07-09 DIAGNOSIS — C50411 Malignant neoplasm of upper-outer quadrant of right female breast: Secondary | ICD-10-CM | POA: Diagnosis not present

## 2021-07-09 MED ORDER — TAMOXIFEN CITRATE 20 MG PO TABS: 20.0000 mg | ORAL_TABLET | Freq: Every day | ORAL | 3 refills | Status: DC

## 2021-07-09 NOTE — Assessment & Plan Note (Signed)
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 I discussed the margins as well as lymph node surgeries. We also discussed the final staging along with previously performed ER/PR and HER-2/neu testing.  Treatment plan: 1.  Oncotype DX to determine if she would benefit from chemotherapy 2. adjuvant antiestrogen therapy (we can consider ovarian suppression with AI versus tamoxifen)  Return to clinic based upon Oncotype DX test result.

## 2021-07-16 NOTE — Progress Notes (Addendum)
Patient is a 41 year old female with PMH of right-sided breast cancer s/p bilateral mastectomy with immediate reconstruction using tissue expanders and Flex HD performed 06/29/2021 by Dr. Marlou Starks and Dr. Marla Roe who presents to clinic for postoperative follow-up.  150 cc was placed into each of her 455 cc expanders on day of surgery.  She was last seen here in office on 07/07/2021.  At that time, she was experiencing approximately 40 cc/day drainage from left drain and 60 cc/day drainage from right drain.  They have been consistently downtrending since day of surgery.  She was only requiring oxycodone at night.  Exam was entirely reassuring.  Normal-appearing drainage in functional bulbs bilaterally.  Plan was for Vaseline around drain tube insertion sites.  Plan was to return the following week for drain removal and expander fill.  Today, patient is doing well.  She feels appear to have the drains removed.  She has been draining less than 10 cc/day from each bulb for the past several days.  Denies any change in character of drainage.  She is no longer requiring any narcotic analgesics.  She states that she has some right-sided axillary region swelling which she suspects is attributed to her right-sided sentinel node biopsies.  Otherwise, no complaints and states that she is ready to return to work tomorrow.  Physical exam is entirely reassuring.  JP bulbs are intact and functional bilaterally.  No obvious subcutaneous fluid collections noted on exam.  No erythema or ecchymoses.  Breasts are nontender to palpation.  No asymmetry is noted.  Drains are removed without complication or difficulty at bedside.  We placed injectable saline in the Expander using a sterile technique: Right: 70 cc for a total of 220 / 455 cc Left: 70 cc for a total of 220 / 455 cc  Return in 1 to 2 weeks for repeat expander fill.  Picture(s) obtained of the patient and placed in the chart were with the patient's or guardian's  permission.

## 2021-07-17 ENCOUNTER — Ambulatory Visit: Payer: 59 | Admitting: Physician Assistant

## 2021-07-20 ENCOUNTER — Ambulatory Visit (INDEPENDENT_AMBULATORY_CARE_PROVIDER_SITE_OTHER): Payer: 59 | Admitting: Physician Assistant

## 2021-07-20 ENCOUNTER — Ambulatory Visit: Payer: 59 | Attending: General Surgery | Admitting: Physical Therapy

## 2021-07-20 ENCOUNTER — Encounter: Payer: Self-pay | Admitting: Physical Therapy

## 2021-07-20 DIAGNOSIS — M25611 Stiffness of right shoulder, not elsewhere classified: Secondary | ICD-10-CM | POA: Diagnosis present

## 2021-07-20 DIAGNOSIS — C50411 Malignant neoplasm of upper-outer quadrant of right female breast: Secondary | ICD-10-CM | POA: Insufficient documentation

## 2021-07-20 DIAGNOSIS — M25612 Stiffness of left shoulder, not elsewhere classified: Secondary | ICD-10-CM | POA: Diagnosis present

## 2021-07-20 DIAGNOSIS — Z483 Aftercare following surgery for neoplasm: Secondary | ICD-10-CM | POA: Diagnosis present

## 2021-07-20 DIAGNOSIS — Z9889 Other specified postprocedural states: Secondary | ICD-10-CM

## 2021-07-20 DIAGNOSIS — Z170 Estrogen receptor positive status [ER+]: Secondary | ICD-10-CM | POA: Insufficient documentation

## 2021-07-20 NOTE — Therapy (Signed)
OUTPATIENT PHYSICAL THERAPY BREAST CANCER POST OP FOLLOW UP   Patient Name: Jackie Miller MRN: [id number] DOB:June 11, 1980, 41 y.o., female Today's Date: 07/20/2021   PT End of Session - 07/20/21 1316     Visit Number 2    Number of Visits 3    PT Start Time 4270    PT Stop Time 1350    PT Time Calculation (min) 37 min    Activity Tolerance Patient tolerated treatment well    Behavior During Therapy WFL for tasks assessed/performed             Past Medical History:  Diagnosis Date   Asthma    Asthma    Bursitis    right hip   Chicken pox    Family history of adverse reaction to anesthesia    Father - slow to wake   FH: colon cancer    mom died age 7 yo   Hay fever    Headache    tension/cluster   Jaundice    as a baby   Motion sickness    moving vehicles   PCOS (polycystic ovarian syndrome)    PONV (postoperative nausea and vomiting)    Wears contact lenses    Past Surgical History:  Procedure Laterality Date   BREAST BIOPSY Right 04/24/2021   BREAST BIOPSY Left 05/25/2021   BREAST RECONSTRUCTION WITH PLACEMENT OF TISSUE EXPANDER AND FLEX HD (ACELLULAR HYDRATED DERMIS) Bilateral 06/29/2021   Procedure: BILATERAL BREAST RECONSTRUCTION WITH PLACEMENT OF TISSUE EXPANDER AND DERMAL MATRIX SUBSTITUTE;  Surgeon: Wallace Going, DO;  Location: [facility];  Service: Plastics;  Laterality: Bilateral;   COLONOSCOPY WITH PROPOFOL N/A 04/10/2019   Procedure: COLONOSCOPY WITH PROPOFOL;  Surgeon: Lucilla Lame, MD;  Location: [facility];  Service: Endoscopy;  Laterality: N/A;   EYE SURGERY Bilateral 02/16/2004   lasik's   MASTECTOMY W/ SENTINEL NODE BIOPSY Right 06/29/2021   Procedure: RIGHT MASTECTOMY WITH SENTINEL LYMPH NODE BIOPSY;  Surgeon: Jovita Kussmaul, MD;  Location: [facility];  Service: General;  Laterality: Right;   MYRINGOTOMY WITH TUBE PLACEMENT     TONSILLECTOMY AND ADENOIDECTOMY     TOTAL MASTECTOMY Left 06/29/2021    Procedure: LEFT TOTAL MASTECTOMY;  Surgeon: Jovita Kussmaul, MD;  Location: [facility];  Service: General;  Laterality: Left;   Patient Active Problem List   Diagnosis Date Noted   Breast cancer ([place]) 06/29/2021   Genetic testing 05/06/2021   Malignant neoplasm of upper-outer quadrant of right breast in female, estrogen receptor positive ([place]) 05/01/2021   COVID-19 07/02/2020   Cough 07/02/2020   Annual physical exam 10/08/2019   Back pain 07/11/2019   Family history of malignant neoplasm of gastrointestinal tract    Family history of colon cancer requiring screening colonoscopy 02/26/2019   Acute bronchitis 01/28/2016   Chronic fatigue 04/27/2015   PCOS (polycystic ovarian syndrome) 04/27/2015   Intolerance to cold 04/27/2015   Asthmatic bronchitis 04/27/2015    REFERRING PROVIDER: Dr. Autumn Messing  REFERRING DIAG: Right breast cancer  THERAPY DIAG:  Malignant neoplasm of upper-outer quadrant of right breast in female, estrogen receptor positive ([place])  Aftercare following surgery for neoplasm  Stiffness of left shoulder, not elsewhere classified  Stiffness of right shoulder, not elsewhere classified  Rationale for Evaluation and Treatment Rehabilitation  ONSET DATE: 06/29/2021  SUBJECTIVE:  SUBJECTIVE STATEMENT: Patient reports she underwent a bilateral mastectomy with a right sentinel node biopsy (4 negative nodes) on 06/29/2021 with expanders placed at that time for reconstruction. No chemo or radiation needed; got expanders filled and drains removed earlier today.  PERTINENT HISTORY:  Patient was diagnosed on 03/19/2021 with right grade II invasive ductal carcinoma breast cancer. She underwent a bilateral mastectomy with a right sentinel node biopsy (4 negative nodes) on 06/29/2021  with expanders placed at that time for reconstruction. It is ER/PR positive and HER2 negative with a Ki67 of 2%.   PATIENT GOALS:  Reassess how my recovery is going related to arm function, pain, and swelling.  PAIN:  Are you having pain? 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   LEISURE: She is walking daily for 20 minutes   PRIOR LEVEL OF FUNCTION: Independent   OBJECTIVE:   PATIENT SURVEYS:  QUICK DASH:  Quick Dash - 07/20/21 0001     Open a tight or new jar Mild difficulty    Do heavy household chores (wash walls, wash floors) Moderate difficulty    Carry a shopping bag or briefcase Mild difficulty    Wash your back Moderate difficulty    Use a knife to cut food No difficulty    Recreational activities in which you take some force or impact through your arm, shoulder, or hand (golf, hammering, tennis) Unable    During the past week, to what extent has your arm, shoulder or hand problem interfered with your normal social activities with family, friends, neighbors, or groups? Slightly    During the past week, to what extent has your arm, shoulder or hand problem limited your work or other regular daily activities Modererately    Arm, shoulder, or hand pain. Moderate    Tingling (pins and needles) in your arm, shoulder, or hand Moderate    Difficulty Sleeping Mild difficulty    DASH Score 40.91 %              OBSERVATIONS:  Bil chest incision appear to be healing well. Drain sites covered with  gauze. No significant edema noted; mild edema noted in right axillary region. No redness noted.  POSTURE:  Forward head; rounded shoulders  LYMPHEDEMA ASSESSMENT:   UPPER EXTREMITY AROM/PROM:   A/PROM RIGHT  05/06/2021   RIGHT 07/20/2021  Shoulder extension 62 50  Shoulder flexion 153 125  Shoulder abduction 163 130  Shoulder internal rotation 70 68  Shoulder external rotation 85 82                          (Blank rows = not tested)   A/PROM LEFT  05/06/2021 LEFT 07/20/2021  Shoulder extension 33 47  Shoulder flexion 150 113  Shoulder abduction 159 125  Shoulder internal rotation 73 78  Shoulder external rotation 90 82                          (Blank rows = not tested)     CERVICAL AROM: All within  normal limits     UPPER EXTREMITY STRENGTH: WNL     LYMPHEDEMA ASSESSMENTS:    LANDMARK RIGHT  05/06/2021 RIGHT 07/20/2021  10 cm proximal to olecranon process 27.6 27.2  Olecranon process 23.9 24.6  10 cm proximal to ulnar styloid process 20.5 20.9  Just proximal to ulnar styloid process 14.5 14.5  Across hand at thumb web space 17.6 17.6  At base of 2nd digit 5.7 5.5  (Blank rows = not tested)   LANDMARK LEFT  05/06/2021 LEFT 07/20/2021  10 cm proximal to olecranon process 28.5 28.5  Olecranon process 24.4 24.7  10 cm proximal to ulnar styloid process 21 21  Just proximal to ulnar styloid process 15 14.7  Across hand at thumb web space 18.8 18  At base of 2nd digit 6 5.8  (Blank rows = not tested)      Surgery type/Date: Bil mastectomy, right sentinel node biopsy, expanders placed 06/29/2021 Number of lymph nodes removed: 4 Current/past treatment (chemo, radiation, hormone therapy): none Other symptoms:  Heaviness/tightness No Pain Yes Pitting edema No Infections No Decreased scar mobility Yes Stemmer sign No   PATIENT EDUCATION:  Education details: HEP; closed chain stretches Person educated: Patient Education method: Customer service manager Education  comprehension: verbalized understanding and returned demonstration   HOME EXERCISE PROGRAM:  Reviewed previously given post op HEP.   ASSESSMENT:  CLINICAL IMPRESSION: Patient is doing well s/p bil mastectomies with reconstruction on 06/29/2021. She shows no signs of lymphedema and her incisions are healing well. She is limited with bil shoulder ROM but plans to work on that at home and see me back in 1 moth. She lives 45 min away and with he returning to work, she requested to try that first. She agrees that if she is limited in 1 month, she will return and begin PT.  Pt will benefit from skilled therapeutic intervention to improve on the following deficits: Decreased knowledge of precautions, impaired UE functional use, pain, decreased ROM, postural dysfunction.   PT treatment/interventions: ADL/Self care home management, Therapeutic exercises, Therapeutic activity, Patient/Family education, and Manual therapy     GOALS: Goals reviewed with patient? Yes  LONG TERM GOALS:  (STG=LTG)  GOALS Name Target Date  Goal status  1 Pt will demonstrate she has regained full shoulder ROM and function post operatively compared to baselines.  Baseline: 07/01/2021 IN PROGRESS     PLAN: PT FREQUENCY/DURATION: 1 more visit for f/u in 1 month  PLAN FOR NEXT SESSION: Reassess shoulder ROM and determine need for PT   [facility]  [address], [address]  [place] [address]  [phone]  After Breast Cancer Class It is recommended you attend the ABC class to be educated on lymphedema risk reduction. This class is free of charge and lasts for 1 hour. It is a 1-time class. You will need to download the Webex app either on your phone or computer. We will send you a link the night before or the morning of the class. You should be able to click on that link to join the class. This is not a confidential class. You don't have to turn your camera on, but other participants may  be able to see your email address.  Scar massage You can begin gentle scar massage to you incision sites. Gently place one hand on the incision and move the skin (without sliding on the skin) in various directions. Do this for a few minutes and then you can gently  massage either coconut oil or vitamin E cream into the scars.  Compression garment You should continue wearing your compression bra until you feel like you no longer have swelling.  Home exercise Program Continue doing the exercises you were given until you feel like you can do them without feeling any tightness at the end.   Walking Program Studies show that 30 minutes of walking per day (fast enough to elevate your heart rate) can significantly reduce the risk of a cancer recurrence. If you can't walk due to other medical reasons, we encourage you to find another activity you could do (like a stationary bike or water exercise).  Posture After breast cancer surgery, people frequently sit with rounded shoulders posture because it puts their incisions on slack and feels better. If you sit like this and scar tissue forms in that position, you can become very tight and have pain sitting or standing with good posture. Try to be aware of your posture and sit and stand up tall to heal properly.  Follow up PT: It is recommended you return every 3 months for the first 3 years following surgery to be assessed on the SOZO machine for an L-Dex score. This helps prevent clinically significant lymphedema in 95% of patients. These follow up screens are 10 minute appointments that you are not billed for. You are scheduled for Aug. 29th at 9:50.  Annia Friendly, [place] 07/20/21 1:56 PM

## 2021-07-20 NOTE — Patient Instructions (Signed)
Closed Chain: Shoulder Abduction / Adduction - on Wall    One hand on wall, step to side and return. Stepping causes shoulder to abduct and adduct. Step __5_ times, holding 5 seconds, __2_ times per day.  http://ss.exer.us/267   Copyright  VHI. All rights reserved.  Closed Chain: Shoulder Flexion / Extension - on Wall    Hands on wall, step backward. Return. Stepping causes shoulder flexion and extension Step __5_ times, holding 5 seconds, __2_ times per day.  http://ss.exer.us/265   Copyright  VHI. All rights reserved.   After Breast Cancer Class It is recommended you attend the ABC class to be educated on lymphedema risk reduction. This class is free of charge and lasts for 1 hour. It is a 1-time class. You will need to download the Webex app either on your phone or computer. We will send you a link the night before or the morning of the class. You should be able to click on that link to join the class. This is not a confidential class. You don't have to turn your camera on, but other participants may be able to see your email address. You're scheduled for June 19th at 11:00.  Scar massage You can begin gentle scar massage to you incision sites. Gently place one hand on the incision and move the skin (without sliding on the skin) in various directions. Do this for a few minutes and then you can gently massage either coconut oil or vitamin E cream into the scars.  Compression garment You should continue wearing your compression bra until you feel like you no longer have swelling.  Home exercise Program Continue doing the exercises you were given until you feel like you can do them without feeling any tightness at the end.   Walking Program Studies show that 30 minutes of walking per day (fast enough to elevate your heart rate) can significantly reduce the risk of a cancer recurrence. If you can't walk due to other medical reasons, we encourage you to find another activity you  could do (like a stationary bike or water exercise).  Posture After breast cancer surgery, people frequently sit with rounded shoulders posture because it puts their incisions on slack and feels better. If you sit like this and scar tissue forms in that position, you can become very tight and have pain sitting or standing with good posture. Try to be aware of your posture and sit and stand up tall to heal properly.  Follow up PT: It is recommended you return every 3 months for the first 3 years following surgery to be assessed on the SOZO machine for an L-Dex score. This helps prevent clinically significant lymphedema in 95% of patients. These follow up screens are 10 minute appointments that you are not billed for.  [facility] [address], [place], [place] [address] Phone # [phone] Fax [phone]

## 2021-07-27 ENCOUNTER — Ambulatory Visit (INDEPENDENT_AMBULATORY_CARE_PROVIDER_SITE_OTHER): Payer: 59 | Admitting: Physician Assistant

## 2021-07-27 DIAGNOSIS — Z9889 Other specified postprocedural states: Secondary | ICD-10-CM

## 2021-07-27 NOTE — Progress Notes (Signed)
Patient is a 41 year old female with PMH of right-sided breast cancer s/p bilateral mastectomy with immediate reconstruction using tissue expanders and Flex HD performed 06/29/2021 by Dr. Marlou Starks and Dr. Marla Roe who presents to clinic for postoperative follow-up.  150 cc was placed into each of her 455 cc expanders on day of surgery.  She was last seen here in clinic on 07/20/2021.  At that time, drains were removed without complication or difficulty.  Exam was entirely reassuring.  Expander fill was performed for a total of 220/455 cc in each expander.  Today, patient is doing well.  She states that she took some ibuprofen after last expander fill and despite some tightness for the first day, has not had any issues.  She only takes ibuprofen occasionally at night.  States that she is doing really well.  Feels prepared for next expander fill.  She thinks this might be her last because she is already becoming comfortable with her size.  Physical exam is entirely reassuring.  No large subcutaneous fluid collections appreciated that would be otherwise amenable to aspiration.  No asymmetric swelling.  No redness.  Steri-Strips remain in place.  We placed injectable saline in the Expander using a sterile technique: Right: 70 cc for a total of 290 / 455 cc Left: 70 cc for a total of 290 / 455 cc  We will obtain photos and remove Steri-Strips at subsequent encounter.

## 2021-07-31 NOTE — Progress Notes (Unsigned)
Patient is a 41 year old female with PMH of right-sided breast cancer s/p bilateral mastectomy with immediate reconstruction using tissue expanders and Flex HD performed 06/29/2021 by Dr. Marlou Starks and Dr. Marla Roe who presents to clinic for postoperative follow-up.   She was last seen here in clinic on 07/27/2021.  At this time, she is doing well and physical exam was reassuring.  Expander fill was performed for a total of 290/455 cc in each expander.  Plan was to obtain photos and remove Steri-Strips at subsequent encounter.  Today, patient is doing well.  She feels as though the mild amount of swelling over superior aspect of left breast has slowly improved.  She states that she feels like she is at her desired size and would not like an additional expander fill today.  She confirmed that she would like to be a full B/small C.  She denies any redness, pain, swelling, or fevers.  Physical exam is entirely reassuring.  Breasts with excellent shape and symmetry.  No obvious seroma amenable to aspiration.  Patient like to proceed with next phase of reconstruction.  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  She only has 290/455 cc in each expander, but is satisfied with current size.  Will discuss with Dr. Marla Roe.  She understands that we may do an additional expander fill at her pre-operative exam if needed.  Picture(s) obtained of the patient and placed in the chart were with the patient's or guardian's permission.  Addendum: Discussed with Dr. Marla Roe who reviewed photos.  Will provide an additional expander fill at time of pre-operative exam.

## 2021-08-03 ENCOUNTER — Ambulatory Visit (INDEPENDENT_AMBULATORY_CARE_PROVIDER_SITE_OTHER): Payer: 59 | Admitting: Physician Assistant

## 2021-08-03 ENCOUNTER — Encounter: Payer: Self-pay | Admitting: Physician Assistant

## 2021-08-03 DIAGNOSIS — Z9889 Other specified postprocedural states: Secondary | ICD-10-CM

## 2021-08-17 ENCOUNTER — Ambulatory Visit: Payer: 59 | Attending: General Surgery | Admitting: Physical Therapy

## 2021-08-17 ENCOUNTER — Encounter: Payer: Self-pay | Admitting: Physical Therapy

## 2021-08-17 DIAGNOSIS — C50411 Malignant neoplasm of upper-outer quadrant of right female breast: Secondary | ICD-10-CM | POA: Diagnosis not present

## 2021-08-17 DIAGNOSIS — M25612 Stiffness of left shoulder, not elsewhere classified: Secondary | ICD-10-CM | POA: Diagnosis present

## 2021-08-17 DIAGNOSIS — Z483 Aftercare following surgery for neoplasm: Secondary | ICD-10-CM | POA: Insufficient documentation

## 2021-08-17 DIAGNOSIS — Z170 Estrogen receptor positive status [ER+]: Secondary | ICD-10-CM | POA: Insufficient documentation

## 2021-08-17 DIAGNOSIS — M25611 Stiffness of right shoulder, not elsewhere classified: Secondary | ICD-10-CM | POA: Insufficient documentation

## 2021-08-17 NOTE — Therapy (Signed)
OUTPATIENT PHYSICAL THERAPY TREATMENT NOTE   Patient Name: Jackie Miller MRN: [id number] DOB:02/11/1981, 41 y.o., female Today's Date: 08/17/2021   REFERRING PROVIDER: Dr. Autumn Messing  END OF SESSION:   PT End of Session - 08/17/21 1306     Visit Number 3    Number of Visits 3    PT Start Time 1302    PT Stop Time 1325    PT Time Calculation (min) 23 min    Activity Tolerance Patient tolerated treatment well    Behavior During Therapy WFL for tasks assessed/performed             Past Medical History:  Diagnosis Date   Asthma    Asthma    Bursitis    right hip   Chicken pox    Family history of adverse reaction to anesthesia    Father - slow to wake   FH: colon cancer    mom died age 64 yo   Hay fever    Headache    tension/cluster   Jaundice    as a baby   Motion sickness    moving vehicles   PCOS (polycystic ovarian syndrome)    PONV (postoperative nausea and vomiting)    Wears contact lenses    Past Surgical History:  Procedure Laterality Date   BREAST BIOPSY Right 04/24/2021   BREAST BIOPSY Left 05/25/2021   BREAST RECONSTRUCTION WITH PLACEMENT OF TISSUE EXPANDER AND FLEX HD (ACELLULAR HYDRATED DERMIS) Bilateral 06/29/2021   Procedure: BILATERAL BREAST RECONSTRUCTION WITH PLACEMENT OF TISSUE EXPANDER AND DERMAL MATRIX SUBSTITUTE;  Surgeon: Wallace Going, DO;  Location: [facility];  Service: Plastics;  Laterality: Bilateral;   COLONOSCOPY WITH PROPOFOL N/A 04/10/2019   Procedure: COLONOSCOPY WITH PROPOFOL;  Surgeon: Lucilla Lame, MD;  Location: [place];  Service: Endoscopy;  Laterality: N/A;   EYE SURGERY Bilateral 02/16/2004   lasik's   MASTECTOMY W/ SENTINEL NODE BIOPSY Right 06/29/2021   Procedure: RIGHT MASTECTOMY WITH SENTINEL LYMPH NODE BIOPSY;  Surgeon: Jovita Kussmaul, MD;  Location: [place];  Service: General;  Laterality: Right;   MYRINGOTOMY WITH TUBE PLACEMENT     TONSILLECTOMY AND ADENOIDECTOMY      TOTAL MASTECTOMY Left 06/29/2021   Procedure: LEFT TOTAL MASTECTOMY;  Surgeon: Jovita Kussmaul, MD;  Location: [place];  Service: General;  Laterality: Left;   Patient Active Problem List   Diagnosis Date Noted   Breast cancer ([place]) 06/29/2021   Genetic testing 05/06/2021   Malignant neoplasm of upper-outer quadrant of right breast in female, estrogen receptor positive ([place]) 05/01/2021   COVID-19 07/02/2020   Cough 07/02/2020   Annual physical exam 10/08/2019   Back pain 07/11/2019   Family history of malignant neoplasm of gastrointestinal tract    Family history of colon cancer requiring screening colonoscopy 02/26/2019   Acute bronchitis 01/28/2016   Chronic fatigue 04/27/2015   PCOS (polycystic ovarian syndrome) 04/27/2015   Intolerance to cold 04/27/2015   Asthmatic bronchitis 04/27/2015    REFERRING DIAG: Right breast cancer  THERAPY DIAG:  Malignant neoplasm of upper-outer quadrant of right breast in female, estrogen receptor positive ([place])  Aftercare following surgery for neoplasm  Stiffness of left shoulder, not elsewhere classified  Stiffness of right shoulder, not elsewhere classified  Rationale for Evaluation and Treatment Rehabilitation  PERTINENT HISTORY: Patient was diagnosed on 03/19/2021 with right grade II invasive ductal carcinoma breast cancer. She underwent a bilateral mastectomy with a right sentinel node biopsy (  4 negative nodes) on 06/29/2021 with expanders placed at that time for reconstruction. It is ER/PR positive and HER2 negative with a Ki67 of 2%.   PRECAUTIONS: Right arm lymphedema risk  SUBJECTIVE: Pt here for f/u after being seen a month ago to determine if she needs to begin PT for her bil shoulder ROM deficits s/p bil mastectomy.  PAIN:  Are you having pain? No   OBJECTIVE: (objective measures completed at initial evaluation unless otherwise dated)  OBJECTIVE:    PATIENT SURVEYS:  QUICK DASH:  Quick Dash - 08/17/21  0001     Open a tight or new jar No difficulty    Do heavy household chores (wash walls, wash floors) No difficulty    Carry a shopping bag or briefcase No difficulty    Wash your back No difficulty    Use a knife to cut food No difficulty    Recreational activities in which you take some force or impact through your arm, shoulder, or hand (golf, hammering, tennis) No difficulty    During the past week, to what extent has your arm, shoulder or hand problem interfered with your normal social activities with family, friends, neighbors, or groups? Not at all    During the past week, to what extent has your arm, shoulder or hand problem limited your work or other regular daily activities Not at all    Arm, shoulder, or hand pain. None    Tingling (pins and needles) in your arm, shoulder, or hand None    Difficulty Sleeping No difficulty    DASH Score 0 %               UPPER EXTREMITY AROM/PROM:   A/PROM RIGHT  05/06/2021   RIGHT 07/20/2021 RIGHT 08/17/2021  Shoulder extension 62 50 53  Shoulder flexion 153 125 144  Shoulder abduction 163 130 148  Shoulder internal rotation 70 68 72  Shoulder external rotation 85 82 85                          (Blank rows = not tested)   A/PROM LEFT  05/06/2021 LEFT 07/20/2021 LEFT 08/17/2021  Shoulder extension 33 47 65  Shoulder flexion 150 113 142  Shoulder abduction 159 125 146  Shoulder internal rotation 73 78 80  Shoulder external rotation 90 82 89                          (Blank rows = not tested)       LYMPHEDEMA ASSESSMENTS:    LANDMARK RIGHT  05/06/2021 RIGHT 07/20/2021  10 cm proximal to olecranon process 27.6 27.2  Olecranon process 23.9 24.6  10 cm proximal to ulnar styloid process 20.5 20.9  Just proximal to ulnar styloid process 14.5 14.5  Across hand at thumb web space 17.6 17.6  At base of 2nd digit 5.7 5.5  (Blank rows = not tested)   LANDMARK LEFT  05/06/2021 LEFT 07/20/2021  10 cm proximal to olecranon process 28.5 28.5   Olecranon process 24.4 24.7  10 cm proximal to ulnar styloid process 21 21  Just proximal to ulnar styloid process 15 14.7  Across hand at thumb web space 18.8 18  At base of 2nd digit 6 5.8  (Blank rows = not tested)                  Surgery type/Date: Bil mastectomy, right sentinel node biopsy,  expanders placed 06/29/2021 Number of lymph nodes removed: 4 Current/past treatment (chemo, radiation, hormone therapy): none     ASSESSMENT:   CLINICAL IMPRESSION: Patient has made significant progress in the past month with bil shoulder ROM and feels she has no functional deficits. DASH score today was zero which is the same as her pre-op score. She has some visible tissue tightness in her right axilla which she reports feels tight but does not limit her ROM and may resolve once expanders are removed on 09/09/2021. We dicussed continued HEP and adding in yoga poses and closed chain weightbearing exercises to improve last few degrees of shoulder ROM. Because of her lack of functional deficits, there is no need to begin PT treatments at this time. She knows to contact me with any changes.   Pt will benefit from skilled therapeutic intervention to improve on the following deficits: Decreased knowledge of precautions, impaired UE functional use, pain, decreased ROM, postural dysfunction.    PT treatment/interventions: ADL/Self care home management, Therapeutic exercises, Therapeutic activity, Patient/Family education, and Manual therapy         GOALS: Goals reviewed with patient? Yes   LONG TERM GOALS:  (STG=LTG)   GOALS Name Target Date   Goal status  1 Pt will demonstrate she has regained full shoulder ROM and function post operatively compared to baselines.  Baseline: 07/01/2021 Partially met        PLAN: PT FREQUENCY/DURATION: N/A   PLAN FOR NEXT SESSION:  D/C - continue with [facility] screens    PHYSICAL THERAPY DISCHARGE SUMMARY  Visits from Start of Care: 2  Current functional  level related to goals / functional outcomes: See above for objective measurements.   Remaining deficits: Sightly limited shoulder ROM   Education / Equipment: HEP   Patient agrees to discharge. Patient goals were partially met. Patient is being discharged due to being pleased with the current functional level.  Annia Friendly, [place] 08/17/21 1:32 PM

## 2021-08-19 NOTE — Telephone Encounter (Signed)
Thank you :)

## 2021-08-24 ENCOUNTER — Encounter: Payer: Self-pay | Admitting: Physician Assistant

## 2021-08-24 ENCOUNTER — Ambulatory Visit (INDEPENDENT_AMBULATORY_CARE_PROVIDER_SITE_OTHER): Payer: 59 | Admitting: Physician Assistant

## 2021-08-24 VITALS — BP 127/77 | HR 75 | Ht 67.0 in | Wt 150.0 lb

## 2021-08-24 DIAGNOSIS — Z9889 Other specified postprocedural states: Secondary | ICD-10-CM

## 2021-08-24 MED ORDER — ONDANSETRON 4 MG PO TBDP: 4.0000 mg | ORAL_TABLET | Freq: Three times a day (TID) | ORAL | 0 refills | Status: DC | PRN

## 2021-08-24 MED ORDER — OXYCODONE HCL 5 MG PO TABS: 5.0000 mg | ORAL_TABLET | Freq: Four times a day (QID) | ORAL | 0 refills | Status: AC | PRN

## 2021-08-24 NOTE — H&P (View-Only) (Signed)
Patient ID: Jackie Miller, female    DOB: 09-04-1980, 41 y.o.   MRN: [id number]  Chief Complaint  Patient presents with   Pre-op Exam      ICD-10-CM   1. S/P breast reconstruction  Z98.890        History of Present Illness: Jackie Miller is a 41 y.o.  female  with a history of right-sided breast cancer s/p bilateral mastectomy with immediate reconstruction using tissue expander and Flex HD 06/29/2021.  She presents for preoperative evaluation for upcoming procedure, bilateral implant exchange, scheduled for 09/09/2021 with Dr. Marla Roe.  The patient has not had problems with anesthesia.  She denies any personal or family history of blood clots or clotting disorder.  Asthma is well controlled.  She is excited to proceed with the next phase of her breast reconstruction.  She does state that the right breast has started to appear more outward pointing compared to contralateral side.  Instructed patient to mention this to Dr. Marla Roe on day of surgery as we may be able to tighten the lateral border of the pocket.  She takes tamoxifen but has already discussed holding it perioperatively with her oncologist, Dr. Lindi Adie.  Advised her to hold 2-week prior to surgery and restart 1 week after surgery.  Lastly, she tells me that she would like to proceed with nipple tattooing postoperatively.  Discussed that we could proceed with this 3 months postop.  We placed injectable saline in the Expander using a sterile technique: Right: 40 cc for a total of 330 / 455 cc Left: 40 cc for a total of 330 / 455 cc  Summary of Previous Visit: She was last seen here in clinic on 08/03/2021.  At that time, she felt as though she was at her desired size with 290/455 cc in each expander.  She confirmed that she would like to be a full B or small C cup after implant exchange.  Exam is reassuring.  Discussed with Dr. Marla Roe who reviewed postoperative photos and plan is for additional expander fill at time of  preoperative exam.  Job: Surgical coordinator and dance instructor, discussed 2 weeks FMLA/STD.  PMH Significant for: Right-sided breast cancer on maintenance tamoxifen, asthma.     Past Medical History: Allergies: Allergies  Allergen Reactions   Bacitracin Itching and Rash   Amoxicillin Swelling   Doxycycline Other (See Comments)    Muscloskeletal pain    Current Medications:  Current Outpatient Medications:    cetirizine (ZYRTEC) 10 MG chewable tablet, Chew 10 mg by mouth daily., Disp: , Rfl:    montelukast (SINGULAIR) 10 MG tablet, Take 10 mg by mouth at bedtime., Disp: , Rfl:    PROAIR HFA 108 (90 Base) MCG/ACT inhaler, Inhale 2 puffs into the lungs every 6 (six) hours as needed., Disp: 18 g, Rfl: 1   tamoxifen (NOLVADEX) 20 MG tablet, Take 1 tablet (20 mg total) by mouth daily., Disp: 90 tablet, Rfl: 3  Past Medical Problems: Past Medical History:  Diagnosis Date   Asthma    Asthma    Bursitis    right hip   Chicken pox    Family history of adverse reaction to anesthesia    Father - slow to wake   FH: colon cancer    mom died age 53 yo   Hay fever    Headache    tension/cluster   Jaundice    as a baby   Motion sickness    moving vehicles  PCOS (polycystic ovarian syndrome)    PONV (postoperative nausea and vomiting)    Wears contact lenses     Past Surgical History: Past Surgical History:  Procedure Laterality Date   BREAST BIOPSY Right 04/24/2021   BREAST BIOPSY Left 05/25/2021   BREAST RECONSTRUCTION WITH PLACEMENT OF TISSUE EXPANDER AND FLEX HD (ACELLULAR HYDRATED DERMIS) Bilateral 06/29/2021   Procedure: BILATERAL BREAST RECONSTRUCTION WITH PLACEMENT OF TISSUE EXPANDER AND DERMAL MATRIX SUBSTITUTE;  Surgeon: Wallace Going, DO;  Location: [facility];  Service: Plastics;  Laterality: Bilateral;   COLONOSCOPY WITH PROPOFOL N/A 04/10/2019   Procedure: COLONOSCOPY WITH PROPOFOL;  Surgeon: Lucilla Lame, MD;  Location: [facility];  Service: Endoscopy;  Laterality: N/A;   EYE SURGERY Bilateral 02/16/2004   lasik's   MASTECTOMY W/ SENTINEL NODE BIOPSY Right 06/29/2021   Procedure: RIGHT MASTECTOMY WITH SENTINEL LYMPH NODE BIOPSY;  Surgeon: Jovita Kussmaul, MD;  Location: [facility];  Service: General;  Laterality: Right;   MYRINGOTOMY WITH TUBE PLACEMENT     TONSILLECTOMY AND ADENOIDECTOMY     TOTAL MASTECTOMY Left 06/29/2021   Procedure: LEFT TOTAL MASTECTOMY;  Surgeon: Jovita Kussmaul, MD;  Location: [facility];  Service: General;  Laterality: Left;    Social History: Social History   Socioeconomic History   Marital status: Single    Spouse name: Not on file   Number of children: Not on file   Years of education: Not on file   Highest education level: Not on file  Occupational History   Not on file  Tobacco Use   Smoking status: Never   Smokeless tobacco: Never  Vaping Use   Vaping Use: Never used  Substance and Sexual Activity   Alcohol use: No    Alcohol/week: 0.0 - 1.0 standard drinks of alcohol   Drug use: No   Sexual activity: Not Currently  Other Topics Concern   Not on file  Social History Narrative   Single w/o kids as of 02/23/19    No pets    From [facility]-    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       DPR dad Nicole Kindred [phone] & step mom Sharon [phone]   Social Determinants of Health   Financial Resource Strain: Not on file  Food Insecurity: Not on file  Transportation Needs: Not on file  Physical Activity: Not on file  Stress: Not on file  Social Connections: Not on file  Intimate Partner Violence: Not on file    Family History: Family History  Problem Relation Age of Onset   Colon cancer Mother 66   Hypertension Father    Breast cancer Maternal Grandmother    Lung cancer Paternal Grandfather    Breast cancer Cousin     Review of Systems: ROS Denies any recent infection, illness, or  traumas.  Physical Exam: Vital Signs BP 127/77 (BP Location: Left Arm, Patient Position: Sitting, Cuff Size: Normal)   Pulse 75   Ht '5\' 7"'$  (1.702 m)   Wt 150 lb (68 kg)   LMP 08/04/2021 (Exact Date)   SpO2 97%   BMI 23.49 kg/m   Physical Exam Constitutional:      General: Not in acute distress.    Appearance: Normal appearance. Not ill-appearing.  HENT:     Head: Normocephalic and atraumatic.  Eyes:     Pupils: Pupils are equal, round. Cardiovascular:     Rate  and Rhythm: Normal rate.    Pulses: Normal pulses.  Pulmonary:     Effort: No respiratory distress or increased work of breathing.  Speaks in full sentences. Abdominal:     General: Abdomen is flat. No distension.   Musculoskeletal: Normal range of motion. No lower extremity swelling or edema. No varicosities. Skin:    General: Skin is warm and dry.     Findings: No erythema or rash.  Neurological:     Mental Status: Alert and oriented to person, place, and time.  Psychiatric:        Mood and Affect: Mood normal.        Behavior: Behavior normal.    Assessment/Plan: The patient is scheduled for bilateral implant exchange with Dr. Marla Roe.  Risks, benefits, and alternatives of procedure discussed, questions answered and consent obtained.    Smoking Status: Non-smoker. Last Mammogram: S/p double mastectomy  Caprini Score: 4; Risk Factors include: Personal history of breast cancer and length of planned surgery. Recommendation for mechanical prophylaxis. Encourage early ambulation.   Pictures obtained: 08/03/2021  Post-op Rx sent to pharmacy: Oxycodone and Zofran.  Patient was provided with the General Surgical Risk consent document and Pain Medication Agreement prior to their appointment.  They had adequate time to read through the risk consent documents and Pain Medication Agreement. We also discussed them in person together during this preop appointment. All of their questions were answered to their  satisfaction.  Recommended calling if they have any further questions.  Risk consent form and Pain Medication Agreement to be scanned into patient's chart.  The risks that can be encountered with and after placement of a breast implant placement were discussed and include the following but not limited to these: bleeding, infection, delayed healing, anesthesia risks, skin sensation changes, injury to structures including nerves, blood vessels, and muscles which may be temporary or permanent, allergies to tape, suture materials and glues, blood products, topical preparations or injected agents, skin contour irregularities, skin discoloration and swelling, deep vein thrombosis, cardiac and pulmonary complications, pain, which may persist, fluid accumulation, wrinkling of the skin over the implant, changes in nipple or breast sensation, implant leakage or rupture, faulty position of the implant, persistent pain, formation of tight scar tissue around the implant (capsular contracture), possible need for revisional surgery or staged procedures.   Electronically signed by: Krista Blue, PA-C 08/24/2021 11:23 AM

## 2021-08-24 NOTE — Progress Notes (Signed)
Patient ID: Jackie Miller, female    DOB: 09-04-1980, 41 y.o.   MRN: [id number]  Chief Complaint  Patient presents with   Pre-op Exam      ICD-10-CM   1. S/P breast reconstruction  Z98.890        History of Present Illness: Jackie Miller is a 41 y.o.  female  with a history of right-sided breast cancer s/p bilateral mastectomy with immediate reconstruction using tissue expander and Flex HD 06/29/2021.  She presents for preoperative evaluation for upcoming procedure, bilateral implant exchange, scheduled for 09/09/2021 with Dr. Marla Roe.  The patient has not had problems with anesthesia.  She denies any personal or family history of blood clots or clotting disorder.  Asthma is well controlled.  She is excited to proceed with the next phase of her breast reconstruction.  She does state that the right breast has started to appear more outward pointing compared to contralateral side.  Instructed patient to mention this to Dr. Marla Roe on day of surgery as we may be able to tighten the lateral border of the pocket.  She takes tamoxifen but has already discussed holding it perioperatively with her oncologist, Dr. Lindi Adie.  Advised her to hold 2-week prior to surgery and restart 1 week after surgery.  Lastly, she tells me that she would like to proceed with nipple tattooing postoperatively.  Discussed that we could proceed with this 3 months postop.  We placed injectable saline in the Expander using a sterile technique: Right: 40 cc for a total of 330 / 455 cc Left: 40 cc for a total of 330 / 455 cc  Summary of Previous Visit: She was last seen here in clinic on 08/03/2021.  At that time, she felt as though she was at her desired size with 290/455 cc in each expander.  She confirmed that she would like to be a full B or small C cup after implant exchange.  Exam is reassuring.  Discussed with Dr. Marla Roe who reviewed postoperative photos and plan is for additional expander fill at time of  preoperative exam.  Job: Surgical coordinator and dance instructor, discussed 2 weeks FMLA/STD.  PMH Significant for: Right-sided breast cancer on maintenance tamoxifen, asthma.     Past Medical History: Allergies: Allergies  Allergen Reactions   Bacitracin Itching and Rash   Amoxicillin Swelling   Doxycycline Other (See Comments)    Muscloskeletal pain    Current Medications:  Current Outpatient Medications:    cetirizine (ZYRTEC) 10 MG chewable tablet, Chew 10 mg by mouth daily., Disp: , Rfl:    montelukast (SINGULAIR) 10 MG tablet, Take 10 mg by mouth at bedtime., Disp: , Rfl:    PROAIR HFA 108 (90 Base) MCG/ACT inhaler, Inhale 2 puffs into the lungs every 6 (six) hours as needed., Disp: 18 g, Rfl: 1   tamoxifen (NOLVADEX) 20 MG tablet, Take 1 tablet (20 mg total) by mouth daily., Disp: 90 tablet, Rfl: 3  Past Medical Problems: Past Medical History:  Diagnosis Date   Asthma    Asthma    Bursitis    right hip   Chicken pox    Family history of adverse reaction to anesthesia    Father - slow to wake   FH: colon cancer    mom died age 28 yo   Hay fever    Headache    tension/cluster   Jaundice    as a baby   Motion sickness    moving vehicles  PCOS (polycystic ovarian syndrome)    PONV (postoperative nausea and vomiting)    Wears contact lenses     Past Surgical History: Past Surgical History:  Procedure Laterality Date   BREAST BIOPSY Right 04/24/2021   BREAST BIOPSY Left 05/25/2021   BREAST RECONSTRUCTION WITH PLACEMENT OF TISSUE EXPANDER AND FLEX HD (ACELLULAR HYDRATED DERMIS) Bilateral 06/29/2021   Procedure: BILATERAL BREAST RECONSTRUCTION WITH PLACEMENT OF TISSUE EXPANDER AND DERMAL MATRIX SUBSTITUTE;  Surgeon: Wallace Going, DO;  Location: [facility];  Service: Plastics;  Laterality: Bilateral;   COLONOSCOPY WITH PROPOFOL N/A 04/10/2019   Procedure: COLONOSCOPY WITH PROPOFOL;  Surgeon: Lucilla Lame, MD;  Location: [facility];  Service: Endoscopy;  Laterality: N/A;   EYE SURGERY Bilateral 02/16/2004   lasik's   MASTECTOMY W/ SENTINEL NODE BIOPSY Right 06/29/2021   Procedure: RIGHT MASTECTOMY WITH SENTINEL LYMPH NODE BIOPSY;  Surgeon: Jovita Kussmaul, MD;  Location: [facility];  Service: General;  Laterality: Right;   MYRINGOTOMY WITH TUBE PLACEMENT     TONSILLECTOMY AND ADENOIDECTOMY     TOTAL MASTECTOMY Left 06/29/2021   Procedure: LEFT TOTAL MASTECTOMY;  Surgeon: Jovita Kussmaul, MD;  Location: [facility];  Service: General;  Laterality: Left;    Social History: Social History   Socioeconomic History   Marital status: Single    Spouse name: Not on file   Number of children: Not on file   Years of education: Not on file   Highest education level: Not on file  Occupational History   Not on file  Tobacco Use   Smoking status: Never   Smokeless tobacco: Never  Vaping Use   Vaping Use: Never used  Substance and Sexual Activity   Alcohol use: No    Alcohol/week: 0.0 - 1.0 standard drinks of alcohol   Drug use: No   Sexual activity: Not Currently  Other Topics Concern   Not on file  Social History Narrative   Single w/o kids as of 02/23/19    No pets    From [facility]-    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       DPR dad Nicole Kindred [phone] & step mom Sharon [phone]   Social Determinants of Health   Financial Resource Strain: Not on file  Food Insecurity: Not on file  Transportation Needs: Not on file  Physical Activity: Not on file  Stress: Not on file  Social Connections: Not on file  Intimate Partner Violence: Not on file    Family History: Family History  Problem Relation Age of Onset   Colon cancer Mother 108   Hypertension Father    Breast cancer Maternal Grandmother    Lung cancer Paternal Grandfather    Breast cancer Cousin     Review of Systems: ROS Denies any recent infection, illness, or  traumas.  Physical Exam: Vital Signs BP 127/77 (BP Location: Left Arm, Patient Position: Sitting, Cuff Size: Normal)   Pulse 75   Ht '5\' 7"'$  (1.702 m)   Wt 150 lb (68 kg)   LMP 08/04/2021 (Exact Date)   SpO2 97%   BMI 23.49 kg/m   Physical Exam Constitutional:      General: Not in acute distress.    Appearance: Normal appearance. Not ill-appearing.  HENT:     Head: Normocephalic and atraumatic.  Eyes:     Pupils: Pupils are equal, round. Cardiovascular:     Rate  and Rhythm: Normal rate.    Pulses: Normal pulses.  Pulmonary:     Effort: No respiratory distress or increased work of breathing.  Speaks in full sentences. Abdominal:     General: Abdomen is flat. No distension.   Musculoskeletal: Normal range of motion. No lower extremity swelling or edema. No varicosities. Skin:    General: Skin is warm and dry.     Findings: No erythema or rash.  Neurological:     Mental Status: Alert and oriented to person, place, and time.  Psychiatric:        Mood and Affect: Mood normal.        Behavior: Behavior normal.    Assessment/Plan: The patient is scheduled for bilateral implant exchange with Dr. Marla Roe.  Risks, benefits, and alternatives of procedure discussed, questions answered and consent obtained.    Smoking Status: Non-smoker. Last Mammogram: S/p double mastectomy  Caprini Score: 4; Risk Factors include: Personal history of breast cancer and length of planned surgery. Recommendation for mechanical prophylaxis. Encourage early ambulation.   Pictures obtained: 08/03/2021  Post-op Rx sent to pharmacy: Oxycodone and Zofran.  Patient was provided with the General Surgical Risk consent document and Pain Medication Agreement prior to their appointment.  They had adequate time to read through the risk consent documents and Pain Medication Agreement. We also discussed them in person together during this preop appointment. All of their questions were answered to their  satisfaction.  Recommended calling if they have any further questions.  Risk consent form and Pain Medication Agreement to be scanned into patient's chart.  The risks that can be encountered with and after placement of a breast implant placement were discussed and include the following but not limited to these: bleeding, infection, delayed healing, anesthesia risks, skin sensation changes, injury to structures including nerves, blood vessels, and muscles which may be temporary or permanent, allergies to tape, suture materials and glues, blood products, topical preparations or injected agents, skin contour irregularities, skin discoloration and swelling, deep vein thrombosis, cardiac and pulmonary complications, pain, which may persist, fluid accumulation, wrinkling of the skin over the implant, changes in nipple or breast sensation, implant leakage or rupture, faulty position of the implant, persistent pain, formation of tight scar tissue around the implant (capsular contracture), possible need for revisional surgery or staged procedures.   Electronically signed by: Krista Blue, PA-C 08/24/2021 11:23 AM

## 2021-09-01 ENCOUNTER — Encounter (HOSPITAL_BASED_OUTPATIENT_CLINIC_OR_DEPARTMENT_OTHER): Payer: Self-pay | Admitting: Plastic Surgery

## 2021-09-01 ENCOUNTER — Other Ambulatory Visit: Payer: Self-pay

## 2021-09-09 ENCOUNTER — Ambulatory Visit (HOSPITAL_BASED_OUTPATIENT_CLINIC_OR_DEPARTMENT_OTHER): Payer: 59 | Admitting: Anesthesiology

## 2021-09-09 ENCOUNTER — Ambulatory Visit (HOSPITAL_BASED_OUTPATIENT_CLINIC_OR_DEPARTMENT_OTHER)
Admission: RE | Admit: 2021-09-09 | Discharge: 2021-09-09 | Disposition: A | Discharge status: Home / Self Care | Payer: 59 | Attending: Plastic Surgery | Admitting: Plastic Surgery

## 2021-09-09 ENCOUNTER — Encounter (HOSPITAL_BASED_OUTPATIENT_CLINIC_OR_DEPARTMENT_OTHER): Payer: Self-pay | Admitting: Plastic Surgery

## 2021-09-09 ENCOUNTER — Encounter (HOSPITAL_BASED_OUTPATIENT_CLINIC_OR_DEPARTMENT_OTHER)
Admission: RE | Disposition: A | Discharge status: Home / Self Care | Payer: Self-pay | Source: Home / Self Care | Attending: Plastic Surgery

## 2021-09-09 ENCOUNTER — Other Ambulatory Visit: Payer: Self-pay

## 2021-09-09 DIAGNOSIS — Z421 Encounter for breast reconstruction following mastectomy: Secondary | ICD-10-CM | POA: Diagnosis not present

## 2021-09-09 DIAGNOSIS — Z9013 Acquired absence of bilateral breasts and nipples: Secondary | ICD-10-CM | POA: Insufficient documentation

## 2021-09-09 DIAGNOSIS — Z853 Personal history of malignant neoplasm of breast: Secondary | ICD-10-CM

## 2021-09-09 DIAGNOSIS — C50411 Malignant neoplasm of upper-outer quadrant of right female breast: Secondary | ICD-10-CM | POA: Diagnosis not present

## 2021-09-09 DIAGNOSIS — Z45811 Encounter for adjustment or removal of right breast implant: Secondary | ICD-10-CM

## 2021-09-09 DIAGNOSIS — Z45812 Encounter for adjustment or removal of left breast implant: Secondary | ICD-10-CM

## 2021-09-09 DIAGNOSIS — Z01818 Encounter for other preprocedural examination: Secondary | ICD-10-CM

## 2021-09-09 HISTORY — DX: Malignant (primary) neoplasm, unspecified: C80.1

## 2021-09-09 HISTORY — PX: REMOVAL OF BILATERAL TISSUE EXPANDERS WITH PLACEMENT OF BILATERAL BREAST IMPLANTS: SHX6431

## 2021-09-09 LAB — POCT PREGNANCY, URINE: Preg Test, Ur: NEGATIVE

## 2021-09-09 SURGERY — REMOVAL, TISSUE EXPANDER, BREAST, BILATERAL, WITH BILATERAL IMPLANT IMPLANT INSERTION
Anesthesia: General | Site: Breast | Laterality: Bilateral

## 2021-09-09 MED ORDER — ACETAMINOPHEN 325 MG PO TABS: 650.0000 mg | ORAL_TABLET | ORAL | Status: DC | PRN

## 2021-09-09 MED ORDER — OXYCODONE HCL 5 MG/5ML PO SOLN: 5.0000 mg | Freq: Once | ORAL | Status: DC | PRN

## 2021-09-09 MED ORDER — ACETAMINOPHEN 325 MG RE SUPP: 650.0000 mg | RECTAL | Status: DC | PRN

## 2021-09-09 MED ORDER — FENTANYL CITRATE (PF) 100 MCG/2ML IJ SOLN
25.0000 ug | INTRAMUSCULAR | Status: DC | PRN
  Administered 2021-09-09: 25 ug via INTRAVENOUS

## 2021-09-09 MED ORDER — PROPOFOL 500 MG/50ML IV EMUL
INTRAVENOUS | Status: DC | PRN
  Administered 2021-09-09: 150 ug/kg/min via INTRAVENOUS

## 2021-09-09 MED ORDER — ACETAMINOPHEN 500 MG PO TABS
1000.0000 mg | ORAL_TABLET | Freq: Once | ORAL | Status: AC
Start: 2021-09-09 — End: 2021-09-09
  Administered 2021-09-09: 1000 mg via ORAL

## 2021-09-09 MED ORDER — OXYCODONE HCL 5 MG PO TABS: 5.0000 mg | ORAL_TABLET | Freq: Once | ORAL | Status: DC | PRN

## 2021-09-09 MED ORDER — PROPOFOL 10 MG/ML IV BOLUS
INTRAVENOUS | Status: DC | PRN
  Administered 2021-09-09: 180 mg via INTRAVENOUS

## 2021-09-09 MED ORDER — SCOPOLAMINE 1 MG/3DAYS TD PT72
1.0000 | MEDICATED_PATCH | TRANSDERMAL | Status: DC
Start: 2021-09-09 — End: 2021-09-09
  Administered 2021-09-09: 1.5 mg via TRANSDERMAL

## 2021-09-09 MED ORDER — DEXMEDETOMIDINE (PRECEDEX) IN NS 20 MCG/5ML (4 MCG/ML) IV SYRINGE
PREFILLED_SYRINGE | INTRAVENOUS | Status: DC | PRN
  Administered 2021-09-09: 4 ug via INTRAVENOUS
  Administered 2021-09-09: 8 ug via INTRAVENOUS

## 2021-09-09 MED ORDER — LIDOCAINE 2% (20 MG/ML) 5 ML SYRINGE
INTRAMUSCULAR | Status: AC
Start: 2021-09-09 — End: ?
  Filled 2021-09-09: qty 5

## 2021-09-09 MED ORDER — SODIUM CHLORIDE 0.9% FLUSH: 3.0000 mL | INTRAVENOUS | Status: DC | PRN

## 2021-09-09 MED ORDER — OXYCODONE HCL 5 MG PO TABS: 5.0000 mg | ORAL_TABLET | ORAL | Status: DC | PRN

## 2021-09-09 MED ORDER — SCOPOLAMINE 1 MG/3DAYS TD PT72
MEDICATED_PATCH | TRANSDERMAL | Status: AC
  Filled 2021-09-09: qty 1

## 2021-09-09 MED ORDER — CELECOXIB 200 MG PO CAPS
ORAL_CAPSULE | ORAL | Status: AC
  Filled 2021-09-09: qty 1

## 2021-09-09 MED ORDER — SODIUM CHLORIDE 0.9 % IV SOLN: 250.0000 mL | INTRAVENOUS | Status: DC | PRN

## 2021-09-09 MED ORDER — ACETAMINOPHEN 500 MG PO TABS
ORAL_TABLET | ORAL | Status: AC
  Filled 2021-09-09: qty 2

## 2021-09-09 MED ORDER — CHLORHEXIDINE GLUCONATE CLOTH 2 % EX PADS: 6.0000 | MEDICATED_PAD | Freq: Once | CUTANEOUS | Status: DC

## 2021-09-09 MED ORDER — FENTANYL CITRATE (PF) 100 MCG/2ML IJ SOLN
INTRAMUSCULAR | Status: AC
  Filled 2021-09-09: qty 2

## 2021-09-09 MED ORDER — MIDAZOLAM HCL 2 MG/2ML IJ SOLN
INTRAMUSCULAR | Status: AC
  Filled 2021-09-09: qty 2

## 2021-09-09 MED ORDER — FENTANYL CITRATE (PF) 100 MCG/2ML IJ SOLN
INTRAMUSCULAR | Status: DC | PRN
  Administered 2021-09-09: 50 ug via INTRAVENOUS
  Administered 2021-09-09 (×6): 25 ug via INTRAVENOUS

## 2021-09-09 MED ORDER — AMISULPRIDE (ANTIEMETIC) 5 MG/2ML IV SOLN: 10.0000 mg | Freq: Once | INTRAVENOUS | Status: DC | PRN

## 2021-09-09 MED ORDER — FENTANYL CITRATE (PF) 100 MCG/2ML IJ SOLN: 25.0000 ug | INTRAMUSCULAR | Status: DC | PRN

## 2021-09-09 MED ORDER — LIDOCAINE-EPINEPHRINE 1 %-1:100000 IJ SOLN
INTRAMUSCULAR | Status: DC | PRN
  Administered 2021-09-09: 3 mL via INTRAMUSCULAR

## 2021-09-09 MED ORDER — LACTATED RINGERS IV SOLN: INTRAVENOUS | Status: DC

## 2021-09-09 MED ORDER — SODIUM CHLORIDE 0.9 % IV SOLN
INTRAVENOUS | Status: DC | PRN
  Administered 2021-09-09: 200 mL

## 2021-09-09 MED ORDER — DEXAMETHASONE SODIUM PHOSPHATE 10 MG/ML IJ SOLN
INTRAMUSCULAR | Status: DC | PRN
  Administered 2021-09-09: 4 mg via INTRAVENOUS

## 2021-09-09 MED ORDER — PROPOFOL 500 MG/50ML IV EMUL
INTRAVENOUS | Status: AC
Start: 2021-09-09 — End: ?
  Filled 2021-09-09: qty 100

## 2021-09-09 MED ORDER — CIPROFLOXACIN IN D5W 400 MG/200ML IV SOLN
400.0000 mg | INTRAVENOUS | Status: AC
  Administered 2021-09-09: 400 mg via INTRAVENOUS

## 2021-09-09 MED ORDER — CELECOXIB 200 MG PO CAPS
200.0000 mg | ORAL_CAPSULE | Freq: Once | ORAL | Status: AC
  Administered 2021-09-09: 200 mg via ORAL

## 2021-09-09 MED ORDER — ONDANSETRON HCL 4 MG/2ML IJ SOLN
INTRAMUSCULAR | Status: DC | PRN
  Administered 2021-09-09: 4 mg via INTRAVENOUS

## 2021-09-09 MED ORDER — SODIUM CHLORIDE 0.9% FLUSH: 3.0000 mL | Freq: Two times a day (BID) | INTRAVENOUS | Status: DC

## 2021-09-09 MED ORDER — MIDAZOLAM HCL 5 MG/5ML IJ SOLN
INTRAMUSCULAR | Status: DC | PRN
  Administered 2021-09-09 (×2): 1 mg via INTRAVENOUS

## 2021-09-09 MED ORDER — PROPOFOL 500 MG/50ML IV EMUL
INTRAVENOUS | Status: AC
  Filled 2021-09-09: qty 50

## 2021-09-09 MED ORDER — SODIUM CHLORIDE 0.9 % IV SOLN
INTRAVENOUS | Status: AC
  Filled 2021-09-09: qty 10

## 2021-09-09 MED ORDER — CIPROFLOXACIN IN D5W 400 MG/200ML IV SOLN
INTRAVENOUS | Status: AC
  Filled 2021-09-09: qty 200

## 2021-09-09 MED ORDER — PROPOFOL 10 MG/ML IV BOLUS
INTRAVENOUS | Status: AC
  Filled 2021-09-09: qty 20

## 2021-09-09 MED ORDER — LIDOCAINE HCL 1 % IJ SOLN
INTRAMUSCULAR | Status: DC | PRN
  Administered 2021-09-09: 50 mg via INTRADERMAL

## 2021-09-09 MED ORDER — ONDANSETRON HCL 4 MG/2ML IJ SOLN
INTRAMUSCULAR | Status: AC
  Filled 2021-09-09: qty 2

## 2021-09-09 SURGICAL SUPPLY — 63 items
ADH SKN CLS APL DERMABOND .7 (GAUZE/BANDAGES/DRESSINGS) ×2
BAG DECANTER FOR FLEXI CONT (MISCELLANEOUS) ×2 IMPLANT
BINDER BREAST LRG (GAUZE/BANDAGES/DRESSINGS) IMPLANT
BINDER BREAST MEDIUM (GAUZE/BANDAGES/DRESSINGS) IMPLANT
BINDER BREAST XLRG (GAUZE/BANDAGES/DRESSINGS) IMPLANT
BINDER BREAST XXLRG (GAUZE/BANDAGES/DRESSINGS) IMPLANT
BIOPATCH RED 1 DISK 7.0 (GAUZE/BANDAGES/DRESSINGS) IMPLANT
BLADE HEX COATED 2.75 (ELECTRODE) IMPLANT
BLADE SURG 15 STRL LF DISP TIS (BLADE) ×2 IMPLANT
BLADE SURG 15 STRL SS (BLADE) ×4
CANISTER SUCT 1200ML W/VALVE (MISCELLANEOUS) ×2 IMPLANT
COVER BACK TABLE 60X90IN (DRAPES) ×2 IMPLANT
COVER MAYO STAND STRL (DRAPES) ×2 IMPLANT
DERMABOND ADVANCED (GAUZE/BANDAGES/DRESSINGS) ×2
DERMABOND ADVANCED .7 DNX12 (GAUZE/BANDAGES/DRESSINGS) ×2 IMPLANT
DRAIN CHANNEL 19F RND (DRAIN) IMPLANT
DRAPE LAPAROSCOPIC ABDOMINAL (DRAPES) ×2 IMPLANT
DRSG OPSITE POSTOP 4X6 (GAUZE/BANDAGES/DRESSINGS) ×4 IMPLANT
DRSG PAD ABDOMINAL 8X10 ST (GAUZE/BANDAGES/DRESSINGS) ×4 IMPLANT
ELECT BLADE 4.0 EZ CLEAN MEGAD (MISCELLANEOUS) ×2
ELECT REM PT RETURN 9FT ADLT (ELECTROSURGICAL) ×2
ELECTRODE BLDE 4.0 EZ CLN MEGD (MISCELLANEOUS) ×1 IMPLANT
ELECTRODE REM PT RTRN 9FT ADLT (ELECTROSURGICAL) ×1 IMPLANT
EVACUATOR SILICONE 100CC (DRAIN) IMPLANT
FUNNEL KELLER 2 DISP (MISCELLANEOUS) IMPLANT
GAUZE SPONGE 4X4 12PLY STRL LF (GAUZE/BANDAGES/DRESSINGS) IMPLANT
GLOVE BIO SURGEON STRL SZ 6.5 (GLOVE) ×6 IMPLANT
GLOVE BIO SURGEON STRL SZ7 (GLOVE) IMPLANT
GLOVE BIOGEL PI IND STRL 7.0 (GLOVE) IMPLANT
GLOVE BIOGEL PI INDICATOR 7.0 (GLOVE)
GOWN STRL REUS W/ TWL LRG LVL3 (GOWN DISPOSABLE) ×2 IMPLANT
GOWN STRL REUS W/TWL LRG LVL3 (GOWN DISPOSABLE) ×4
IMPL BREAST GEL HP 350CC (Breast) IMPLANT
IMPLANT BREAST GEL HP 350CC (Breast) ×4 IMPLANT
IV NS 1000ML (IV SOLUTION)
IV NS 1000ML BAXH (IV SOLUTION) IMPLANT
KIT FILL ASEPTIC TRANSFER (MISCELLANEOUS) IMPLANT
NDL HYPO 25X1 1.5 SAFETY (NEEDLE) ×1 IMPLANT
NDL SAFETY ECLIPSE 18X1.5 (NEEDLE) ×1 IMPLANT
NEEDLE HYPO 18GX1.5 SHARP (NEEDLE) ×2
NEEDLE HYPO 25X1 1.5 SAFETY (NEEDLE) ×2 IMPLANT
PACK BASIN DAY SURGERY FS (CUSTOM PROCEDURE TRAY) ×2 IMPLANT
PENCIL SMOKE EVACUATOR (MISCELLANEOUS) ×2 IMPLANT
PIN SAFETY STERILE (MISCELLANEOUS) IMPLANT
SIZER BREAST REUSE 350CC (SIZER) ×2
SIZER BRST REUS P5.2XULT 350CC (SIZER) IMPLANT
SLEEVE SCD COMPRESS KNEE MED (STOCKING) ×2 IMPLANT
SPIKE FLUID TRANSFER (MISCELLANEOUS) IMPLANT
SPONGE T-LAP 18X18 ~~LOC~~+RFID (SPONGE) ×4 IMPLANT
STRIP SUTURE WOUND CLOSURE 1/2 (MISCELLANEOUS) ×4 IMPLANT
SUT MNCRL AB 4-0 PS2 18 (SUTURE) ×4 IMPLANT
SUT MON AB 3-0 SH 27 (SUTURE) ×4
SUT MON AB 3-0 SH27 (SUTURE) ×2 IMPLANT
SUT PDS 3-0 CT2 (SUTURE) ×4
SUT PDS AB 2-0 CT2 27 (SUTURE) ×4 IMPLANT
SUT PDS II 3-0 CT2 27 ABS (SUTURE) ×2 IMPLANT
SYR BULB IRRIG 60ML STRL (SYRINGE) ×2 IMPLANT
SYR CONTROL 10ML LL (SYRINGE) ×2 IMPLANT
TOWEL GREEN STERILE FF (TOWEL DISPOSABLE) ×4 IMPLANT
TRAY DSU PREP LF (CUSTOM PROCEDURE TRAY) ×2 IMPLANT
TUBE CONNECTING 20X1/4 (TUBING) ×2 IMPLANT
UNDERPAD 30X36 HEAVY ABSORB (UNDERPADS AND DIAPERS) ×4 IMPLANT
YANKAUER SUCT BULB TIP NO VENT (SUCTIONS) ×2 IMPLANT

## 2021-09-09 NOTE — Transfer of Care (Signed)
Immediate Anesthesia Transfer of Care Note  Patient: Jackie Miller  Procedure(s) Performed: REMOVAL OF BILATERAL TISSUE EXPANDERS WITH PLACEMENT OF BILATERAL BREAST IMPLANTS (Bilateral: Breast)  Patient Location: PACU  Anesthesia Type:General  Level of Consciousness: awake, alert , oriented and patient cooperative  Airway & Oxygen Therapy: Patient Spontanous Breathing and Patient connected to face mask oxygen  Post-op Assessment: Report given to RN and Post -op Vital signs reviewed and stable  Post vital signs: Reviewed and stable  Last Vitals:  Vitals Value Taken Time  BP    Temp    Pulse    Resp    SpO2      Last Pain:  Vitals:   09/09/21 0959  TempSrc: Oral  PainSc: 0-No pain      Patients Stated Pain Goal: 3 (19/47/12 5271)  Complications: No notable events documented.

## 2021-09-09 NOTE — Anesthesia Preprocedure Evaluation (Addendum)
Anesthesia Evaluation  Patient identified by MRN, date of birth, ID band Patient awake    Reviewed: Allergy & Precautions, NPO status , Patient's Chart, lab work & pertinent test results  History of Anesthesia Complications (+) PONV and history of anesthetic complications  Airway Mallampati: I  TM Distance: >3 FB Neck ROM: Full    Dental  (+) Dental Advisory Given, Teeth Intact   Pulmonary asthma ,    Pulmonary exam normal        Cardiovascular negative cardio ROS Normal cardiovascular exam     Neuro/Psych  Headaches,  Motion sickness  negative psych ROS   GI/Hepatic negative GI ROS, Neg liver ROS,   Endo/Other  negative endocrine ROS  Renal/GU negative Renal ROS     Musculoskeletal negative musculoskeletal ROS (+)   Abdominal   Peds  Hematology negative hematology ROS (+)   Anesthesia Other Findings   Reproductive/Obstetrics  PCOS Breast cancer                             Anesthesia Physical Anesthesia Plan  ASA: 2  Anesthesia Plan: General   Post-op Pain Management: Tylenol PO (pre-op)* and Celebrex PO (pre-op)*   Induction: Intravenous  PONV Risk Score and Plan: 4 or greater and Treatment may vary due to age or medical condition, Ondansetron, Midazolam, Scopolamine patch - Pre-op, TIVA and Dexamethasone  Airway Management Planned: LMA  Additional Equipment: None  Intra-op Plan:   Post-operative Plan: Extubation in OR  Informed Consent: I have reviewed the patients History and Physical, chart, labs and discussed the procedure including the risks, benefits and alternatives for the proposed anesthesia with the patient or authorized representative who has indicated his/her understanding and acceptance.     Dental advisory given  Plan Discussed with: CRNA and Anesthesiologist  Anesthesia Plan Comments:        Anesthesia Quick Evaluation

## 2021-09-09 NOTE — Anesthesia Procedure Notes (Signed)
Procedure Name: LMA Insertion Date/Time: 09/09/2021 1:11 PM  Performed by: Garrel Ridgel, CRNAPre-anesthesia Checklist: Patient identified, Emergency Drugs available, Suction available and Patient being monitored Patient Re-evaluated:Patient Re-evaluated prior to induction Oxygen Delivery Method: Circle system utilized Preoxygenation: Pre-oxygenation with 100% oxygen Induction Type: IV induction Ventilation: Mask ventilation without difficulty LMA: LMA inserted LMA Size: 4.0 Number of attempts: 1 Placement Confirmation: positive ETCO2 Tube secured with: Tape Dental Injury: Teeth and Oropharynx as per pre-operative assessment

## 2021-09-09 NOTE — Discharge Instructions (Addendum)
INSTRUCTIONS FOR AFTER BREAST SURGERY   You will likely have some questions about what to expect following your operation.  The following information will help you and your family understand what to expect when you are discharged from the hospital.  Following these guidelines will help ensure a smooth recovery and reduce risks of complications.  Postoperative instructions include information on: diet, wound care, medications and physical activity.  AFTER SURGERY Expect to go home after the procedure.  In some cases, you may need to spend one night in the hospital for observation.  DIET Breast surgery does not require a specific diet.  However, the healthier you eat the better your body can start healing. It is important to increasing your protein intake.  This means limiting the foods with sugar and carbohydrates.  Focus on vegetables and some meat.  If you have any liposuction during your procedure be sure to drink water.  If your urine is bright yellow, then it is concentrated, and you need to drink more water.  As a general rule after surgery, you should have 8 ounces of water every hour while awake.  If you find you are persistently nauseated or unable to take in liquids let us know.  NO TOBACCO USE or EXPOSURE.  This will slow your healing process and increase the risk of a wound.  WOUND CARE Leave the binder on for 3 days . Use fragrance free soap.   After 3 days you can remove the binder to shower. Once dry apply ACE wrap, binder or sports bra.  Use a mild soap like Dial, Dove and [place]. You may have Topifoam or Lipofoam on.  It is soft and spongy and helps keep you from getting creases if you have liposuction.  This can be removed before the shower and then replaced.  If you need more it is available on [employer] (Lipofoam). If you have steri-strips / tape directly attached to your skin leave them in place. It is OK to get these wet.   No baths, pools or hot tubs for four weeks. We close your  incision to leave the smallest and best-looking scar. No ointment or creams on your incisions until given the go ahead.  Especially not Neosporin (Too many skin reactions with this one).  A few weeks after surgery you can use Mederma and start massaging the scar. We ask you to wear your binder or sports bra for the first 6 weeks around the clock, including while sleeping. This provides added comfort and helps reduce the fluid accumulation at the surgery site.  ACTIVITY No heavy lifting until cleared by the doctor.  This usually means no more than a half-gallon of milk.  It is OK to walk and climb stairs. In fact, moving your legs is very important to decrease your risk of a blood clot.  It will also help keep you from getting deconditioned.  Every 1 to 2 hours get up and walk for 5 minutes. This will help with a quicker recovery back to normal.  Let pain be your guide so you don't do too much.  This is not the time for spring cleaning and don't plan on taking care of anyone else.  This time is for you to recover,  You will be more comfortable if you sleep and rest with your head elevated either with a few pillows under you or in a recliner.  No stomach sleeping for a three months.  WORK Everyone returns to work at different times.  As a rough guide, most people take at least 1 - 2 weeks off prior to returning to work. If you need documentation for your job, bring the forms to your postoperative follow up visit.  DRIVING Arrange for someone to bring you home from the hospital.  You may be able to drive a few days after surgery but not while taking any narcotics or valium.  BOWEL MOVEMENTS Constipation can occur after anesthesia and while taking pain medication.  It is important to stay ahead for your comfort.  We recommend taking Milk of Magnesia (2 tablespoons; twice a day) while taking the pain pills.  MEDICATIONS You may be prescribed should start after surgery At your preoperative visit for you  history and physical you may have been given the following medications: An antibiotic: Start this medication when you get home and take according to the instructions on the bottle. Zofran 4 mg:  This is to treat nausea and vomiting.  You can take this every 6 hours as needed and only if needed. Valium 2 mg: This is for muscle tightness if you have an implant or expander. This will help relax your muscle which also helps with pain control.  This can be taken every 12 hours as needed. Don't drive after taking this medication. Norco (hydrocodone/acetaminophen) 5/325 mg:  This is only to be used after you have taken the motrin or the tylenol. Every 8 hours as needed.   Over the counter Medication to take: Ibuprofen (Motrin) 600 mg:  Take this every 6 hours.  If you have additional pain then take 500 mg of the tylenol every 8 hours.  Only take the Norco after you have tried these two. Miralax or stool softener of choice: Take this according to the bottle if you take the [place] Call your surgeon's office if any of the following occur: Fever 101 degrees F or greater Excessive bleeding or fluid from the incision site. Pain that increases over time without aid from the medications Redness, warmth, or pus draining from incision sites Persistent nausea or inability to take in liquids Severe misshapen area that underwent the operation.   Post Anesthesia Home Care Instructions  Activity: Get plenty of rest for the remainder of the day. A responsible individual must stay with you for 24 hours following the procedure.  For the next 24 hours, DO NOT: -Drive a car -Paediatric nurse -Drink alcoholic beverages -Take any medication unless instructed by your physician -Make any legal decisions or sign important papers.  Meals: Start with liquid foods such as gelatin or soup. Progress to regular foods as tolerated. Avoid greasy, spicy, heavy foods. If nausea and/or vomiting occur, drink  only clear liquids until the nausea and/or vomiting subsides. Call your physician if vomiting continues.  Special Instructions/Symptoms: Your throat may feel dry or sore from the anesthesia or the breathing tube placed in your throat during surgery. If this causes discomfort, gargle with warm salt water. The discomfort should disappear within 24 hours.  If you had a scopolamine patch placed behind your ear for the management of post- operative nausea and/or vomiting:  1. The medication in the patch is effective for 72 hours, after which it should be removed.  Wrap patch in a tissue and discard in the trash. Wash hands thoroughly with soap and water. 2. You may remove the patch earlier than 72 hours if you experience unpleasant side effects which may include dry mouth, dizziness or visual disturbances. 3. Avoid touching  the patch. Wash your hands with soap and water after contact with the patch.

## 2021-09-09 NOTE — Interval H&P Note (Signed)
History and Physical Interval Note:  09/09/2021 12:26 PM  Jackie Miller  has presented today for surgery, with the diagnosis of Status Post Breast Reconstruction.  The various methods of treatment have been discussed with the patient and family. After consideration of risks, benefits and other options for treatment, the patient has consented to  Procedure(s) with comments: REMOVAL OF BILATERAL TISSUE EXPANDERS WITH PLACEMENT OF BILATERAL BREAST IMPLANTS (Bilateral) - 2 hours as a surgical intervention.  The patient's history has been reviewed, patient examined, no change in status, stable for surgery.  I have reviewed the patient's chart and labs.  Questions were answered to the patient's satisfaction.     Jackie Miller

## 2021-09-09 NOTE — Op Note (Signed)
Op report Bilateral Exchange   DATE OF OPERATION: 09/09/2021  LOCATION: [place]  SURGICAL DIVISION: Plastic Surgery  PREOPERATIVE DIAGNOSIS:  History of right breast cancer upper outer quadrant.  2. Acquired absence of bilateral breast.   POSTOPERATIVE DIAGNOSIS:  History of right breast cancer upper outer quadrant.  2. Acquired absence of bilateral breast.   PROCEDURE:  1. Bilateral exchange of tissue expanders for implants.  2. Bilateral capsulotomies for implant respositioning.  SURGEON: Bertel Venard Sanger Samyukta Cura, DO  ASSISTANT:  Donnamarie Rossetti, PA  ANESTHESIA:  General.   COMPLICATIONS: None.   IMPLANTS: Left - Mentor Smooth Round Ultra High Profile Gel 350 cc. Ref #[id number].  Right - Mentor Smooth Round Ultra High Profile Gel 350 cc. Ref #[id number].  INDICATIONS FOR PROCEDURE:  The patient, Jackie Miller, is a 41 y.o. female born on 1980/10/04, is here for treatment after bilateral mastectomies.  She had tissue expanders placed at the time of mastectomies. She now presents for exchange of her expanders for implants.  She requires capsulotomies to better position the implants. MRN: [id number]  CONSENT:  Informed consent was obtained directly from the patient. Risks, benefits and alternatives were fully discussed. Specific risks including but not limited to bleeding, infection, hematoma, seroma, scarring, pain, implant infection, implant extrusion, capsular contracture, asymmetry, wound healing problems, and need for further surgery were all discussed. The patient did have an ample opportunity to have her questions answered to her satisfaction.   DESCRIPTION OF PROCEDURE:  The patient was taken to the operating room. SCDs were placed and IV antibiotics were given. The patient's chest was prepped and draped in a sterile fashion. A time out was performed and the implants to be used were identified.    On the right breast: One percent Lidocaine with  epinephrine was used to infiltrate at the incision site. The old mastectomy scar was incised.  The mastectomy flaps from the superior and inferior flaps were raised over the pectoralis major muscle for several centimeters to minimize tension for the closure. The pectoralis and Ovitex was incised to expose and remove the tissue expander.  Inspection of the pocket showed a normal healthy capsule and good integration of the matrix.  The tissue and skin were very thin on both sides. The pocket was irrigated with antibiotic solution. Circumferential capsulotomies were performed to allow for breast pocket expansion.  Measurements were made and a sizer used to confirm adequate pocket size for the implant dimensions.  Hemostasis was ensured with electrocautery. New gloves were placed. The implant was soaked in antibiotic solution and then placed in the pocket and oriented appropriately. The pectoralis major muscle and capsule on the anterior surface were re-closed with a 3-0 PDS suture. The remaining skin was closed with 3-0 Monocryl deep dermal and 4-0 Monocryl subcuticular stitches.   On the left breast: The old mastectomy scar was incised.  The mastectomy flaps from the superior and inferior flaps were raised over the pectoralis major muscle for several centimeters to minimize tension for the closure. The pectoralis and Ovitex was incised to expose and remove the tissue expander.  Inspection of the pocket showed a normal healthy capsule and good integration of the matrix. Circumferential capsulotomies were performed to allow for breast pocket expansion.  Measurements were made and a sizer utilized to confirm adequate pocket size for the implant dimensions.  Hemostasis was ensured with the electrocautery.  New gloves were applied. The implant was soaked in antibiotic solution and placed in the pocket and  oriented appropriately. The pectoralis major muscle and capsule on the anterior surface were re-closed with a 3-0 PDS  suture. The remaining skin was closed with 4-0 Monocryl deep dermal and 4-0 Monocryl subcuticular stitches.  Dermabond was applied to the incision site. A breast binder and ABDs were placed.  The patient was allowed to wake from anesthesia and taken to the recovery room in satisfactory condition.   The advanced practice practitioner (APP) assisted throughout the case.  The APP was essential in retraction and counter traction when needed to make the case progress smoothly.  This retraction and assistance made it possible to see the tissue plans for the procedure.  The assistance was needed for blood control, tissue re-approximation and assisted with closure of the incision site.

## 2021-09-10 ENCOUNTER — Encounter (HOSPITAL_BASED_OUTPATIENT_CLINIC_OR_DEPARTMENT_OTHER): Payer: Self-pay | Admitting: Plastic Surgery

## 2021-09-10 NOTE — Anesthesia Postprocedure Evaluation (Signed)
Anesthesia Post Note  Patient: Jackie Miller  Procedure(s) Performed: REMOVAL OF BILATERAL TISSUE EXPANDERS WITH PLACEMENT OF BILATERAL BREAST IMPLANTS (Bilateral: Breast)     Patient location during evaluation: PACU Anesthesia Type: General Level of consciousness: awake and alert Pain management: pain level controlled Vital Signs Assessment: post-procedure vital signs reviewed and stable Respiratory status: spontaneous breathing, nonlabored ventilation and respiratory function stable Cardiovascular status: stable and blood pressure returned to baseline Anesthetic complications: no   No notable events documented.  Last Vitals:  Vitals:   09/09/21 1515 09/09/21 1528  BP: (!) 96/59 (!) 102/55  Pulse: (!) 49 64  Resp: 15 16  Temp:  36.6 C  SpO2: 100% 100%    Last Pain:  Vitals:   09/09/21 1528  TempSrc:   PainSc: 0-No pain   Pain Goal: Patients Stated Pain Goal: 3 (09/09/21 0959)                 Audry Pili

## 2021-09-18 ENCOUNTER — Encounter: Payer: Self-pay | Admitting: Physician Assistant

## 2021-09-18 ENCOUNTER — Encounter: Payer: Self-pay | Admitting: Plastic Surgery

## 2021-09-18 ENCOUNTER — Ambulatory Visit (INDEPENDENT_AMBULATORY_CARE_PROVIDER_SITE_OTHER): Payer: 59 | Admitting: Plastic Surgery

## 2021-09-18 DIAGNOSIS — C50411 Malignant neoplasm of upper-outer quadrant of right female breast: Secondary | ICD-10-CM

## 2021-09-18 DIAGNOSIS — C50919 Malignant neoplasm of unspecified site of unspecified female breast: Secondary | ICD-10-CM

## 2021-09-18 DIAGNOSIS — Z170 Estrogen receptor positive status [ER+]: Secondary | ICD-10-CM

## 2021-09-18 NOTE — Progress Notes (Signed)
   Subjective:    Patient ID: Jackie Miller, female    DOB: 01-29-1981, 41 y.o.   MRN: [id number]  The patient is a 41 year old female here for follow-up after undergoing breast reconstruction July 26.  She was diagnosed with breast cancer earlier in the year and had bilateral mastectomies with expander placement in May.  In July we did the exchange with Mentor smooth round ultrahigh profile gel implants that are 350 cc.  She does not have any signs of seroma or hematoma.  The incision is healing nicely.      Review of Systems  Constitutional: Negative.   Eyes: Negative.   Respiratory: Negative.    Cardiovascular: Negative.   Gastrointestinal: Negative.   Endocrine: Negative.   Genitourinary: Negative.        Objective:   Physical Exam Constitutional:      Appearance: Normal appearance.  Cardiovascular:     Rate and Rhythm: Normal rate.     Pulses: Normal pulses.  Skin:    Capillary Refill: Capillary refill takes less than 2 seconds.  Neurological:     Mental Status: She is alert and oriented to person, place, and time.  Psychiatric:        Mood and Affect: Mood normal.        Behavior: Behavior normal.        Thought Content: Thought content normal.        Assessment & Plan:     ICD-10-CM   1. Malignant neoplasm of upper-outer quadrant of right breast in female, estrogen receptor positive ([place])  C50.411    Z17.0     2. Malignant neoplasm of female breast, unspecified estrogen receptor status, unspecified laterality, unspecified site of breast ([place])  C50.919       Continue with binder or can go into the sports bra.  May remove the dressing if it gets wet on the inside.  I would like to see her back in 2 to 3 weeks.  She would like to get on the books for her nipple areolar reconstruction.  We will get her set up for that.  We will plan to take pictures at her next visit.

## 2021-10-06 ENCOUNTER — Encounter: Payer: Self-pay | Admitting: Student

## 2021-10-06 ENCOUNTER — Ambulatory Visit (INDEPENDENT_AMBULATORY_CARE_PROVIDER_SITE_OTHER): Payer: 59 | Admitting: Student

## 2021-10-06 DIAGNOSIS — Z9889 Other specified postprocedural states: Secondary | ICD-10-CM

## 2021-10-06 NOTE — Progress Notes (Addendum)
Patient is a 41 year old female with history of breast cancer status post breast reconstruction.  Patient most recently underwent bilateral exchange of tissue expanders for implants on 09/09/2021 through Dr. Marla Roe.  Patient had 350 cc Mentor smooth round ultrahigh profile gel implants placed bilaterally.  Patient presents to the clinic for postoperative follow-up.  Patient was last seen in the clinic on 09/18/2021.  At this visit, patient was doing well and her incision was healing nicely.  There is no sign of hematoma or seroma.  Plan was for patient to follow-up in 2 to 3 weeks.  Today, patient reports she is doing well.  She is overall happy with her results.  Patient does complain of a small knot to her right axillary area.  She states that this started on Thursday.  She states that the knot has not changed in size.  She states it slightly tender.  She reports it was a little red this morning.  She denies any fevers or chills.  She states this area has restricted her range of motion for her right upper extremity.  She denies any other issues at this time.  Chaperone present on exam.  On exam, patient is sitting upright in no acute distress.  Implants are in place bilaterally and are soft.  Incisions are intact with Steri-Strips.  Steri-Strips were removed.  Incisions appear to be healing well.  There were several suture knots that were cut and removed from the incisions.  Patient tolerated well.  There is an approximately 5 cm x 2 cm area of firmness to the right axillary region.  There is no overlying erythema or cellulitic changes to the skin.  I discussed with the patient to continue compression at all times.  Discussed with the patient to restart her physical therapy exercises that she was given previously.  I discussed with the patient to continue to monitor the area, and that if it becomes bigger, if it becomes more painful or if it becomes more red to give [place] a call.  Patient expressed  understanding.  I discussed with the patient that she can place Vaseline over her incisions for the next week, and then start using scar creams such as Willeen Niece, Silagen or Mederma over her incisions.  Patient states that she would like to purchase Silagen for her scars.    Expiration date 07/15/2024 Lot #[id number]  Patient to follow-up next week via televisit to check-in regarding the area of firmness.  Instructed patient to call back she has any questions or concerns.  Pictures were obtained of the patient and placed in the patient's chart with patient's permission.  Dr. Marla Roe also examined the patient today.

## 2021-10-09 ENCOUNTER — Ambulatory Visit: Payer: 59 | Admitting: Physician Assistant

## 2021-10-13 ENCOUNTER — Other Ambulatory Visit: Payer: Self-pay

## 2021-10-13 ENCOUNTER — Ambulatory Visit (INDEPENDENT_AMBULATORY_CARE_PROVIDER_SITE_OTHER): Payer: 59 | Admitting: Student

## 2021-10-13 ENCOUNTER — Encounter: Payer: Self-pay | Admitting: Rehabilitation

## 2021-10-13 ENCOUNTER — Inpatient Hospital Stay: Payer: 59 | Attending: Adult Health | Admitting: Adult Health

## 2021-10-13 ENCOUNTER — Ambulatory Visit: Payer: 59 | Attending: General Surgery | Admitting: Rehabilitation

## 2021-10-13 ENCOUNTER — Encounter: Payer: Self-pay | Admitting: Adult Health

## 2021-10-13 VITALS — BP 119/63 | HR 82 | Temp 97.9°F | Resp 16 | Ht 67.0 in | Wt 153.0 lb

## 2021-10-13 DIAGNOSIS — Z170 Estrogen receptor positive status [ER+]: Secondary | ICD-10-CM | POA: Insufficient documentation

## 2021-10-13 DIAGNOSIS — R590 Localized enlarged lymph nodes: Secondary | ICD-10-CM | POA: Diagnosis not present

## 2021-10-13 DIAGNOSIS — Z483 Aftercare following surgery for neoplasm: Secondary | ICD-10-CM | POA: Insufficient documentation

## 2021-10-13 DIAGNOSIS — Z9889 Other specified postprocedural states: Secondary | ICD-10-CM

## 2021-10-13 DIAGNOSIS — C50411 Malignant neoplasm of upper-outer quadrant of right female breast: Secondary | ICD-10-CM | POA: Insufficient documentation

## 2021-10-13 NOTE — Therapy (Signed)
OUTPATIENT PHYSICAL THERAPY SO[facility] SCREENING NOTE   Patient Name: Jackie Miller MRN: [id number] DOB:[birth year], 41 y.o., female Today's Date: 10/13/2021  PCP: Merryl Hacker, No REFERRING PROVIDER: Jovita Kussmaul, MD   PT End of Session - 10/13/21 0945     Visit Number 3   screen only   PT Start Time 0953    PT Stop Time 0956    PT Time Calculation (min) 3 min    Activity Tolerance Patient tolerated treatment well    Behavior During Therapy WFL for tasks assessed/performed             Past Medical History:  Diagnosis Date   Asthma    Asthma    Bursitis    right hip   Cancer ([place])    Breast   Chicken pox    Family history of adverse reaction to anesthesia    Father - slow to wake   FH: colon cancer    mom died age 42 yo   Hay fever    Headache    tension/cluster   Jaundice    as a baby   Motion sickness    moving vehicles   PCOS (polycystic ovarian syndrome)    PONV (postoperative nausea and vomiting)    Wears contact lenses    Past Surgical History:  Procedure Laterality Date   BREAST BIOPSY Right 04/24/2021   BREAST BIOPSY Left 05/25/2021   BREAST RECONSTRUCTION WITH PLACEMENT OF TISSUE EXPANDER AND FLEX HD (ACELLULAR HYDRATED DERMIS) Bilateral 06/29/2021   Procedure: BILATERAL BREAST RECONSTRUCTION WITH PLACEMENT OF TISSUE EXPANDER AND DERMAL MATRIX SUBSTITUTE;  Surgeon: Wallace Going, DO;  Location: [place];  Service: Plastics;  Laterality: Bilateral;   COLONOSCOPY WITH PROPOFOL N/A 04/10/2019   Procedure: COLONOSCOPY WITH PROPOFOL;  Surgeon: Lucilla Lame, MD;  Location: [place];  Service: Endoscopy;  Laterality: N/A;   EYE SURGERY Bilateral 02/16/2004   lasik's   MASTECTOMY W/ SENTINEL NODE BIOPSY Right 06/29/2021   Procedure: RIGHT MASTECTOMY WITH SENTINEL LYMPH NODE BIOPSY;  Surgeon: Jovita Kussmaul, MD;  Location: [place];  Service: General;  Laterality: Right;   MYRINGOTOMY WITH TUBE PLACEMENT     REMOVAL OF  BILATERAL TISSUE EXPANDERS WITH PLACEMENT OF BILATERAL BREAST IMPLANTS Bilateral 09/09/2021   Procedure: REMOVAL OF BILATERAL TISSUE EXPANDERS WITH PLACEMENT OF BILATERAL BREAST IMPLANTS;  Surgeon: Wallace Going, DO;  Location: [place];  Service: Plastics;  Laterality: Bilateral;  2 hours   TONSILLECTOMY AND ADENOIDECTOMY     TOTAL MASTECTOMY Left 06/29/2021   Procedure: LEFT TOTAL MASTECTOMY;  Surgeon: Jovita Kussmaul, MD;  Location: [place];  Service: General;  Laterality: Left;   Patient Active Problem List   Diagnosis Date Noted   Breast cancer ([place]) 06/29/2021   Genetic testing 05/06/2021   Malignant neoplasm of upper-outer quadrant of right breast in female, estrogen receptor positive ([place]) 05/01/2021   COVID-19 07/02/2020   Cough 07/02/2020   Annual physical exam 10/08/2019   Back pain 07/11/2019   Family history of malignant neoplasm of gastrointestinal tract    Family history of colon cancer requiring screening colonoscopy 02/26/2019   Acute bronchitis 01/28/2016   Chronic fatigue 04/27/2015   PCOS (polycystic ovarian syndrome) 04/27/2015   Intolerance to cold 04/27/2015   Asthmatic bronchitis 04/27/2015    REFERRING DIAG: right breast cancer at risk for lymphedema  THERAPY DIAG:  Aftercare following surgery for neoplasm  PERTINENT HISTORY: Patient was diagnosed on 03/19/2021  with right grade II invasive ductal carcinoma breast cancer. She underwent a bilateral mastectomy with a right sentinel node biopsy (4 negative nodes) on 06/29/2021 with expanders placed at that time for reconstruction. It is ER/PR positive and HER2 negative with a Ki67 of 2%.   PRECAUTIONS: right UE Lymphedema risk  SUBJECTIVE: Doing well   PAIN:  Are you having pain? No  SOZO SCREENING: Patient was assessed today using the SOZO machine to determine the lymphedema index score. This was compared to her baseline score. It was determined that she is within the  recommended range when compared to her baseline and no further action is needed at this time. She will continue SOZO screenings. These are done every 3 months for 2 years post operatively followed by every 6 months for 2 years, and then annually.    Stark Bray, PT 10/13/2021, 9:57 AM

## 2021-10-13 NOTE — Progress Notes (Signed)
SURVIVORSHIP VISIT:    BRIEF ONCOLOGIC HISTORY:  Oncology History  Malignant neoplasm of upper-outer quadrant of right breast in female, estrogen receptor positive ([place])  04/24/2021 Initial Diagnosis   Screening mammogram detected right breast calcifications and distortion, ultrasound UOQ 10:00: 1.6 cm biopsy grade 2 IDC ER 70%, PR greater than 95%, HER2 negative, Ki-67 2%, calcs spanned 7 cm: Biopsy intermediate grade DCIS ER 95%, PR 95%   05/06/2021 Cancer Staging   Staging form: Breast, AJCC 8th Edition - Clinical stage from 05/06/2021: Stage IA (cT1b, cN0, cM0, G2, ER+, PR+, HER2-) - Signed by Nicholas Lose, MD on 05/06/2021 Stage prefix: Initial diagnosis Histologic grading system: 3 grade system   06/2021 -  Anti-estrogen oral therapy   Tamoxifen x 10 years   06/29/2021 Surgery   A. BREAST, LEFT, MASTECTOMY:  - Fibrocystic changes with rare calcifications  - Small incidental fibroadenoma, 0.6 cm  - Biopsy site changes  - Negative for carcinoma   B. BREAST, RIGHT, MASTECTOMY:  - Invasive ductal carcinoma, 2.0 cm, grade 1 and ductal carcinoma in situ (ribbon shaped clip)  - Separate focus of patchy ductal carcinoma in situ, intermediate grade, involving an area of fibrocystic change spanning about 7 cm (X shaped clip)  - Invasive carcinoma is 0.2 cm from anterior margin  - Biopsy site changes  - See oncology table   C. LYMPH NODE, RIGHT AXILLARY #1, SENTINEL, EXCISION:  - Lymph node, negative for carcinoma (0/1)   D. LYMPH NODE, RIGHT AXILLARY #2, SENTINEL, EXCISION:  - Lymph node, negative for carcinoma (0/1)   E. LYMPH NODE, RIGHT AXILLARY, SENTINEL, EXCISION:  - Lymph node, negative for carcinoma (0/1)   F. LYMPH NODE, RIGHT AXILLARY, SENTINEL, EXCISION:  - Lymph node, negative for carcinoma (0/1)      INTERVAL HISTORY:  Jackie Miller to review her survivorship care plan detailing her treatment course for breast cancer, as well as monitoring long-term side effects of that  treatment, education regarding health maintenance, screening, and overall wellness and health promotion.     Overall, Jackie Miller reports feeling quite well.  She tells me that she restarted tamoxifen approximately 4 weeks ago and her main side effect has been hot flashes.  She had previously held tamoxifen due to her final implant surgery occurring on September 09, 2021.  She had silicone implants placed and is happy with how things looks thus far.  She does have some right axillary swelling and she notes that it is improved mostly and she will see Dr. Marla Roe in follow-up about this later this afternoon.  REVIEW OF SYSTEMS:  Review of Systems  Constitutional:  Negative for appetite change, chills, fatigue, fever and unexpected weight change.  HENT:   Negative for hearing loss, lump/mass and trouble swallowing.   Eyes:  Negative for eye problems and icterus.  Respiratory:  Negative for chest tightness, cough and shortness of breath.   Cardiovascular:  Negative for chest pain, leg swelling and palpitations.  Gastrointestinal:  Negative for abdominal distention, abdominal pain, constipation, diarrhea, nausea and vomiting.  Endocrine: Positive for hot flashes.  Genitourinary:  Negative for difficulty urinating.   Musculoskeletal:  Negative for arthralgias.  Skin:  Negative for itching and rash.  Neurological:  Negative for dizziness, extremity weakness, headaches and numbness.  Hematological:  Negative for adenopathy. Does not bruise/bleed easily.  Psychiatric/Behavioral:  Negative for depression. The patient is not nervous/anxious.   Breast: Denies any new nodularity, masses, tenderness, nipple changes, or nipple discharge.  ONCOLOGY TREATMENT TEAM:  1. Surgeon:  Dr. Marlou Starks at [facility] 2. Medical Oncologist: Dr. Lindi Adie  3. Radiation Oncologist: Dr. Sondra Come    PAST MEDICAL/SURGICAL HISTORY:  Past Medical History:  Diagnosis Date   Asthma    Asthma    Bursitis    right  hip   Cancer ([place])    Breast   Chicken pox    Family history of adverse reaction to anesthesia    Father - slow to wake   FH: colon cancer    mom died age 47 yo   Hay fever    Headache    tension/cluster   Jaundice    as a baby   Motion sickness    moving vehicles   PCOS (polycystic ovarian syndrome)    PONV (postoperative nausea and vomiting)    Wears contact lenses    Past Surgical History:  Procedure Laterality Date   BREAST BIOPSY Right 04/24/2021   BREAST BIOPSY Left 05/25/2021   BREAST RECONSTRUCTION WITH PLACEMENT OF TISSUE EXPANDER AND FLEX HD (ACELLULAR HYDRATED DERMIS) Bilateral 06/29/2021   Procedure: BILATERAL BREAST RECONSTRUCTION WITH PLACEMENT OF TISSUE EXPANDER AND DERMAL MATRIX SUBSTITUTE;  Surgeon: Wallace Going, DO;  Location: [place];  Service: Plastics;  Laterality: Bilateral;   COLONOSCOPY WITH PROPOFOL N/A 04/10/2019   Procedure: COLONOSCOPY WITH PROPOFOL;  Surgeon: Lucilla Lame, MD;  Location: [place];  Service: Endoscopy;  Laterality: N/A;   EYE SURGERY Bilateral 02/16/2004   lasik's   MASTECTOMY W/ SENTINEL NODE BIOPSY Right 06/29/2021   Procedure: RIGHT MASTECTOMY WITH SENTINEL LYMPH NODE BIOPSY;  Surgeon: Jovita Kussmaul, MD;  Location: [place];  Service: General;  Laterality: Right;   MYRINGOTOMY WITH TUBE PLACEMENT     REMOVAL OF BILATERAL TISSUE EXPANDERS WITH PLACEMENT OF BILATERAL BREAST IMPLANTS Bilateral 09/09/2021   Procedure: REMOVAL OF BILATERAL TISSUE EXPANDERS WITH PLACEMENT OF BILATERAL BREAST IMPLANTS;  Surgeon: Wallace Going, DO;  Location: [place];  Service: Plastics;  Laterality: Bilateral;  2 hours   TONSILLECTOMY AND ADENOIDECTOMY     TOTAL MASTECTOMY Left 06/29/2021   Procedure: LEFT TOTAL MASTECTOMY;  Surgeon: Jovita Kussmaul, MD;  Location: [place];  Service: General;  Laterality: Left;     ALLERGIES:  Allergies  Allergen Reactions    Bacitracin Itching and Rash   Amoxicillin Swelling   Doxycycline Other (See Comments)    Muscloskeletal pain     CURRENT MEDICATIONS:  Outpatient Encounter Medications as of 10/13/2021  Medication Sig   cetirizine (ZYRTEC) 10 MG chewable tablet Chew 10 mg by mouth daily.   montelukast (SINGULAIR) 10 MG tablet Take 10 mg by mouth at bedtime.   PROAIR HFA 108 (90 Base) MCG/ACT inhaler Inhale 2 puffs into the lungs every 6 (six) hours as needed.   tamoxifen (NOLVADEX) 20 MG tablet Take 1 tablet (20 mg total) by mouth daily.   No facility-administered encounter medications on file as of 10/13/2021.     ONCOLOGIC FAMILY HISTORY:  Family History  Problem Relation Age of Onset   Colon cancer Mother 59   Hypertension Father    Breast cancer Maternal Grandmother    Lung cancer Paternal Grandfather    Breast cancer Cousin       SOCIAL HISTORY:  Reviewed    OBSERVATIONS/OBJECTIVE:  BP 119/63 (BP Location: Left Arm, Patient Position: Sitting)   Pulse 82   Temp 97.9 F (36.6 C) (Temporal)   Resp 16  Ht 5' 7"  (1.702 m)   Wt 153 lb (69.4 kg)   SpO2 100%   BMI 23.96 kg/m  GENERAL: Patient is a well appearing female in no acute distress HEENT:  Sclerae anicteric.  Oropharynx clear and moist. No ulcerations or evidence of oropharyngeal candidiasis. Neck is supple.  NODES:  No cervical, supraclavicular, or axillary lymphadenopathy palpated.  BREAST EXAM: She is status post bilateral mastectomies with implant placement sites have healed well.  There is no sign of local recurrence.  She does have some swelling in her right axilla.  This is improved however should it persist I would recommend ultrasound LUNGS:  Clear to auscultation bilaterally.  No wheezes or rhonchi. HEART:  Regular rate and rhythm. No murmur appreciated. ABDOMEN:  Soft, nontender.  Positive, normoactive bowel sounds. No organomegaly palpated. MSK:  No focal spinal tenderness to palpation. Full range of motion  bilaterally in the upper extremities. EXTREMITIES:  No peripheral edema.   SKIN:  Clear with no obvious rashes or skin changes. No nail dyscrasia. NEURO:  Nonfocal. Well oriented.  Appropriate affect.   LABORATORY DATA:  None for this visit.  DIAGNOSTIC IMAGING:  None for this visit.      ASSESSMENT AND PLAN:  Ms.. Miller is a pleasant 41 y.o. female with Stage IA right breast invasive ductal carcinoma, ER+/PR+/HER2-, diagnosed in 04/2021, treated with bilateral mastectomies and anti-estrogen therapy with Tamoxifen beginning in 06/2021.  She presents to the Survivorship Clinic for our initial meeting and routine follow-up post-completion of treatment for breast cancer.    1. Stage IA right breast cancer:  Jackie Miller is continuing to recover from definitive treatment for breast cancer. She will follow-up with her medical oncologist, Dr. Lindi Adie in 6 months with history and physical exam per surveillance protocol.  She will continue her anti-estrogen therapy with Tamoxifen. Thus far, she is tolerating the Tamoxifen well, with minimal side effects. She was instructed to make Dr. Lindi Adie or myself aware if she begins to experience any worsening side effects of the medication and I could see her back in clinic to help manage those side effects, as needed. oday, a comprehensive survivorship care plan and treatment summary was reviewed with the patient today detailing her breast cancer diagnosis, treatment course, potential late/long-term effects of treatment, appropriate follow-up care with recommendations for the future, and patient education resources.  A copy of this summary, along with a letter will be sent to the patient's primary care provider via mail/fax/In Basket message after today's visit.    2.  Right axillary swelling: Should this area of swelling persist I recommended she proceed with axillary ultrasound.  She will call me and let me know if she needs me to order this.  She is also going to  follow-up with Dr. Tedra Coupe him this afternoon to discuss further.  She has been working diligently on her exercises and stretching and will continue to do so.  3. Bone health:   She was given education on specific activities to promote bone health.  4. Cancer screening:  Due to Jackie Miller's history and her age, she should receive screening for skin cancers, colon cancer, and gynecologic cancers.  The information and recommendations are listed on the patient's comprehensive care plan/treatment summary and were reviewed in detail with the patient.    5. Health maintenance and wellness promotion: Jackie Miller was encouraged to consume 5-7 servings of fruits and vegetables per day. We reviewed the "Nutrition Rainbow" handout.  She was also encouraged to  engage in moderate to vigorous exercise for 30 minutes per day most days of the week. We discussed the [employer] fitness program, which is designed for cancer survivors to help them become more physically fit after cancer treatments.  She was instructed to limit her alcohol consumption and continue to abstain from tobacco use.     6. Support services/counseling: It is not uncommon for this period of the patient's cancer care trajectory to be one of many emotions and stressors.  She was given information regarding our available services and encouraged to contact me with any questions or for help enrolling in any of our support group/programs.    Follow up instructions:    -Return to cancer center in 6 months for follow-up with Dr. Lindi Adie -She is welcome to return back to the Survivorship Clinic at any time; no additional follow-up needed at this time.  -Consider referral back to survivorship as a long-term survivor for continued surveillance  The patient was provided an opportunity to ask questions and all were answered. The patient agreed with the plan and demonstrated an understanding of the instructions.   Total encounter time:40 minutes*in  face-to-face visit time, chart review, lab review, care coordination, order entry, and documentation of the encounter time.    Jackie Bihari, NP 10/13/21 8:44 AM Medical Oncology and Hematology [facility] [place], [place] [address] Tel. [phone]    Fax. [phone]  *Total Encounter Time as defined by the Centers for Medicare and Medicaid Services includes, in addition to the face-to-face time of a patient visit (documented in the note above) non-face-to-face time: obtaining and reviewing outside history, ordering and reviewing medications, tests or procedures, care coordination (communications with other health care professionals or caregivers) and documentation in the medical record.

## 2021-10-13 NOTE — Progress Notes (Signed)
Patient is a 41 year old female with history of breast cancer status post breast reconstruction.  Patient most recently underwent bilateral exchange of tissue expanders for implants on 09/09/2021 with Dr. Marla Roe.  Patient had 350 cc Mentor smooth round ultrahigh profile gel implants placed bilaterally.  Patient presents for this telephone visit for follow-up from her last appointment.  Patient was last seen in the clinic on 10/06/2021.  Patient was found to have a small area of firmness to her right axillary region.  She felt as if this area was restricting her range of motion to her right upper extremity at the time.  Plan was for patient to follow-up via telephone visit to assess if the area was improving.  Today, patient reports she is doing well.  She states that her range of motion is much improved after doing physical therapy exercises at home.  Patient states the small area of firmness is still present to her right axillary region.  She states that the area is a little red, but she feels it is because her compression bra is rubbing on the skin.  She denies any warmth over the area.  She denies any fevers or chills.  She denies any change in the size of the area of firmness.  I discussed with the patient to continue to do her physical therapy exercises as a seem to be helping with her range of motion.  I discussed with the patient that we will plan to get an ultrasound of the area.  I discussed with the patient to call us if the area changes in size, if she develops fevers or chills, or if she has any questions or concerns.  Patient to follow-up in the clinic next week.  The patient gave consent to have this visit done by telemedicine / virtual visit, two identifiers were used to identify patient. This is also consent for access the chart and treat the patient via this visit. The patient is located at home.  I, the provider, am at the office.  We spent 6 minutes together for the visit.  Joined by  telephone.

## 2021-10-14 ENCOUNTER — Other Ambulatory Visit: Payer: Self-pay | Admitting: Student

## 2021-10-14 DIAGNOSIS — R2231 Localized swelling, mass and lump, right upper limb: Secondary | ICD-10-CM

## 2021-10-14 DIAGNOSIS — Z9889 Other specified postprocedural states: Secondary | ICD-10-CM

## 2021-10-20 ENCOUNTER — Ambulatory Visit (INDEPENDENT_AMBULATORY_CARE_PROVIDER_SITE_OTHER): Payer: 59 | Admitting: Student

## 2021-10-20 DIAGNOSIS — Z9889 Other specified postprocedural states: Secondary | ICD-10-CM

## 2021-10-20 NOTE — Progress Notes (Signed)
Patient is a 41 year old female with history of breast cancer status post breast reconstruction.  Patient most recently underwent bilateral exchange of tissue expanders for implants on 09/09/2021 Dr. Marla Roe.  Patient had 350 cc Mentor smooth round ultrahigh profile gel implants placed bilaterally.  Patient was last seen in the clinic on 10/06/2021.  She was found to have a small area of firmness to her right axillary region.  Patient had a telephone follow-up visit in regards to this area of firmness on 10/13/2021.  Patient at the telephone visit stated that the area of firmness was still present and unchanged in size.  An ultrasound was ordered for the right axillary region.  Patient presents to the clinic today for follow-up.  Today, patient reports she is doing well.  She states that the area of firmness has gotten smaller and feels less tender.  She states that she believes the physical therapy exercises she has been doing have been helping.  Patient denies any other complaints or concerns at this time.  She denies any fevers or chills.   Chaperone present on exam.  On exam, patient is sitting upright in no acute distress.  Implants are in place bilaterally.  Incisions are healing nicely bilaterally.  There is a small bump palpated to each breast incision, that appears to be consistent with a suture underneath the skin.  The small area of firmness to the right axillary region has decreased in size since her last exam.  There is some minimal erythema over the area of firmness.  There is no fluctuance noted upon palpation.  I discussed with the patient that we will plan to still get the ultrasound to her right axillary region since the area of firmness is still present.  Patient agreed with this plan.  I discussed with the patient that she may gradually increase her exercises and transition out of a compression bra since she is 6 weeks postop.  I also discussed with the patient that she can gently massage  the areas where there appear to be sutures underneath the skin.  Patient acknowledged.  The plan is to discuss the results of the ultrasound with the patient over the phone once they are ready, and to determine if any further visits are warranted after that.  Otherwise, patient can follow-up as needed.  Patient states that she has an appointment at the end of November for nipple tattooing.  I instructed the patient to call if she has any questions or concerns.

## 2021-10-23 ENCOUNTER — Telehealth: Payer: Self-pay | Admitting: Student

## 2021-10-23 ENCOUNTER — Ambulatory Visit
Admission: RE | Admit: 2021-10-23 | Discharge: 2021-10-23 | Disposition: A | Discharge status: Home / Self Care | Payer: 59 | Source: Ambulatory Visit | Attending: Student | Admitting: Student

## 2021-10-23 DIAGNOSIS — R2231 Localized swelling, mass and lump, right upper limb: Secondary | ICD-10-CM

## 2021-10-23 DIAGNOSIS — Z9889 Other specified postprocedural states: Secondary | ICD-10-CM

## 2021-10-23 NOTE — Telephone Encounter (Signed)
I called the patient and discussed the results of the ultrasound with her. I discussed with her that she should continue to monitor the area. We will plan to see the patient in 1 month to re-evaluate.

## 2021-11-25 NOTE — Progress Notes (Signed)
Patient is a 41 year old female with history of breast cancer status post breast reconstruction.  Patient most recently underwent bilateral exchange of tissue expanders for implants on 09/09/2021 Dr. Marla Roe.  Patient had 350 cc Mentor smooth round ultrahigh profile gel implants placed bilaterally.  Patient was last seen in the clinic on 10/20/2021.  At this visit, patient reported the area of firmness to her right axillary region had gotten smaller and felt less tender.  On exam, there is a small area of firmness to her right axillary region with minimal erythema over the area.  There is no fluctuance upon palpation.  Ultrasound was ordered.  Ultrasound showed a 1.7 x 0.6 x 2.2 cm collection in the right axilla that was compatible with postoperative changes.  There were no abnormal right axillary lymph nodes identified.  These results were discussed with the patient and plan was for patient to follow-up in 1 month for reevaluation.  Today, patient reports she is doing well.  She overall is happy with her results.  She states that the area of firmness to her right axillary region has completely gone away.  She has no new concerns or issues.  Patient reports she has been putting Silagen scar ointment on her scars.  Chaperone present on exam.  On exam, implants are in place bilaterally.  There is no overlying erythema or ecchymosis.  Incisions are healed well.  There are no fluid collections palpated on exam.  The area of firmness to her right axillary region has completely resolved.  There is no overlying erythema to the area.  I discussed with the patient that everything appears to be healing well and it appears that the area in the right axillary region has completely resolved.  I discussed with the patient she can continue to put her scar cream on her incisions.   Patient to follow-up as needed.  She states she has an appointment at the end of November for nipple tattooing.  I instructed the patient to  call back if she has any questions or concerns in the meantime.  Pictures were obtained of the patient and placed in the chart with the patient's or guardian's permission.

## 2021-11-26 ENCOUNTER — Ambulatory Visit (INDEPENDENT_AMBULATORY_CARE_PROVIDER_SITE_OTHER): Payer: 59 | Admitting: Student

## 2021-11-26 DIAGNOSIS — Z9889 Other specified postprocedural states: Secondary | ICD-10-CM

## 2021-12-10 DIAGNOSIS — Z719 Counseling, unspecified: Secondary | ICD-10-CM

## 2021-12-28 ENCOUNTER — Ambulatory Visit (INDEPENDENT_AMBULATORY_CARE_PROVIDER_SITE_OTHER): Payer: 59 | Admitting: Physician Assistant

## 2021-12-28 DIAGNOSIS — Z170 Estrogen receptor positive status [ER+]: Secondary | ICD-10-CM

## 2021-12-28 DIAGNOSIS — Z9889 Other specified postprocedural states: Secondary | ICD-10-CM

## 2021-12-28 DIAGNOSIS — C50411 Malignant neoplasm of upper-outer quadrant of right female breast: Secondary | ICD-10-CM | POA: Diagnosis not present

## 2021-12-28 NOTE — Progress Notes (Signed)
Referring Provider No referring provider defined for this encounter.   CC:  Chief Complaint  Patient presents with   Follow-up      Jackie Miller is an 41 y.o. female.  HPI: Patient is a pleasant 41 year old with PMH of right-sided breast cancer s/p bilateral mastectomy 06/29/2021 and reconstruction with implant exchange performed 09/09/2021 Dr. Marla Roe who presents to clinic to discuss nipple areolar restoration.  She tells me that she is interested in NAC tattooing.  She does have a history of fever blisters, but remote.  Do not feel as though prophylactic antiviral therapy is indicated prior to her NAC tattooing.  No history of keloiding or hypertrophic scarring.     Allergies  Allergen Reactions   Bacitracin Itching and Rash   Amoxicillin Swelling   Doxycycline Other (See Comments)    Muscloskeletal pain    Outpatient Encounter Medications as of 12/28/2021  Medication Sig   cetirizine (ZYRTEC) 10 MG chewable tablet Chew 10 mg by mouth daily.   PROAIR HFA 108 (90 Base) MCG/ACT inhaler Inhale 2 puffs into the lungs every 6 (six) hours as needed.   tamoxifen (NOLVADEX) 20 MG tablet Take 1 tablet (20 mg total) by mouth daily.   montelukast (SINGULAIR) 10 MG tablet Take 10 mg by mouth at bedtime. (Patient not taking: Reported on 12/28/2021)   No facility-administered encounter medications on file as of 12/28/2021.     Past Medical History:  Diagnosis Date   Asthma    Asthma    Bursitis    right hip   Cancer ([place])    Breast   Chicken pox    Family history of adverse reaction to anesthesia    Father - slow to wake   FH: colon cancer    mom died age 40 yo   Hay fever    Headache    tension/cluster   Jaundice    as a baby   Motion sickness    moving vehicles   PCOS (polycystic ovarian syndrome)    PONV (postoperative nausea and vomiting)    Wears contact lenses     Past Surgical History:  Procedure Laterality Date   BREAST BIOPSY Right 04/24/2021   BREAST  BIOPSY Left 05/25/2021   BREAST RECONSTRUCTION WITH PLACEMENT OF TISSUE EXPANDER AND FLEX HD (ACELLULAR HYDRATED DERMIS) Bilateral 06/29/2021   Procedure: BILATERAL BREAST RECONSTRUCTION WITH PLACEMENT OF TISSUE EXPANDER AND DERMAL MATRIX SUBSTITUTE;  Surgeon: Wallace Going, DO;  Location: [place];  Service: Plastics;  Laterality: Bilateral;   COLONOSCOPY WITH PROPOFOL N/A 04/10/2019   Procedure: COLONOSCOPY WITH PROPOFOL;  Surgeon: Lucilla Lame, MD;  Location: [place];  Service: Endoscopy;  Laterality: N/A;   EYE SURGERY Bilateral 02/16/2004   lasik's   MASTECTOMY W/ SENTINEL NODE BIOPSY Right 06/29/2021   Procedure: RIGHT MASTECTOMY WITH SENTINEL LYMPH NODE BIOPSY;  Surgeon: Jovita Kussmaul, MD;  Location: [place];  Service: General;  Laterality: Right;   MYRINGOTOMY WITH TUBE PLACEMENT     REMOVAL OF BILATERAL TISSUE EXPANDERS WITH PLACEMENT OF BILATERAL BREAST IMPLANTS Bilateral 09/09/2021   Procedure: REMOVAL OF BILATERAL TISSUE EXPANDERS WITH PLACEMENT OF BILATERAL BREAST IMPLANTS;  Surgeon: Wallace Going, DO;  Location: [place];  Service: Plastics;  Laterality: Bilateral;  2 hours   TONSILLECTOMY AND ADENOIDECTOMY     TOTAL MASTECTOMY Left 06/29/2021   Procedure: LEFT TOTAL MASTECTOMY;  Surgeon: Jovita Kussmaul, MD;  Location: [place];  Service: General;  Laterality: Left;    Family History  Problem Relation Age of Onset   Colon cancer Mother 21   Hypertension Father    Breast cancer Maternal Grandmother    Lung cancer Paternal Grandfather    Breast cancer Cousin     Social History   Social History Narrative   Single w/o kids as of 02/23/19    No pets    From [facility]-    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  She understands that this will likely involve 3-5 sessions, the first of which will be determining NAC location, size, and color.  Discussed the contraindications as well as risks associated with the procedure and she would like to proceed, written consent obtained.    Discussed plans for a pink-colored areola given that she is Fitzpatrick 1 and previously had pink areolas.  Will determine precise ink selection and positioning at her first treatment which is already scheduled.  Krista Blue 12/28/2021, 8:43 AM

## 2022-01-12 ENCOUNTER — Ambulatory Visit: Payer: 59 | Admitting: Physician Assistant

## 2022-01-12 NOTE — Progress Notes (Unsigned)
NIPPLE AREOLAR TATTOO PROCEDURE  PREOPERATIVE DIAGNOSIS:  Acquired absence of bilateral nipple areolar   POSTOPERATIVE DIAGNOSIS: Acquired absence of bilateral nipple areolar    PROCEDURES: Bilateral nipple areolar tattoo   ANESTHESIA:  EMLA, 4% Lidocaine with epinephrine  COMPLICATIONS: None.  JUSTIFICATION FOR PROCEDURE:  Jackie Miller is a 41 y.o. female with a history of breast cancer status post breast reconstruction. The patient presents for nipple areolar complex tattoo. Risks, benefits, indications, and alternatives of the above described procedures were discussed with the patient and all the patient's questions were answered.   Patient's areolas were pink prior to her double mastectomy. She understands that our tattoo ink will require combination of peach and tans to achieve the outcome that she would prefer.  Areola appears slightly more pale in comparison the nipple. Thin black shading circumferentially around nipple, predominantly inferiorly, is seen in her preoperative photos.  Will try to mimic the projection.  After mixing the various ink color combinations, patient feels as though the light tan/peach areola will appear more natural.  We also discussed a darker nipple versus lighter nipple, decided to go with a selection that was a bit brighter and lighter to help with contrast, particularly after we do the surrounding nipple shading at subsequent encounter.  DESCRIPTION OF PROCEDURE: After written informed consent was obtained and proper identification of patient and surgical site was made, the patient was taken to the procedure room and placed supine on the operating room table. A time out was performed to confirm patient's identity and surgical site. The patient was prepped and draped in the usual sterile fashion. Alcohol swabs were used. Attention was turned to the selection of flesh colored permanent tattoo ink to produce the appropriate color for nipple areolar complex  tattooing.  Nipple position was then chosen with patient.  Using a digital revo 7R tattoo head, pointillism pigment was instilled to the designed nipple areolar complex which was confirmed preoperatively with the patient using the Digital-Pop Deluxe machine by [employer]. Spiral technique was used to fill out the areola. Once adequate pigment had been applied to the nipple areolar complex, Xeroform dressing was applied followed by 4 x 4 gauze and secured with Medipore tape. There were no complications and the procedure was tolerated without difficulty.    At the conclusion of the visit, it was difficult to appreciate the contrast of the areola and nipple colors, but suspect that it will be more noticeable as her tattoo fades.  She did have mild bleeding during this procedure which she attributes to having sensitive skin and possible dryness from the cold weather.  She plans to apply moisturizing creams regularly before next encounter.  Suspect that the contrast and nipple projection will be improved when we do shading at subsequent encounter.  Patient is thus far pleased with the size and color selection.  Areola-color: Bright Peach (2), Fair Peach (2), Cool Honey (1), and Fair Honey (1) in 2:2:1:1 ratio respectively.  Nipple color: Bright Peach (1) and Fair Peach (1) in 1:1 ratio.  Additional: None.   Colors EXP: 02/2024  Nipples are each approximately 3.5 cm diameter at conclusion of visit. Return in 6-8 weeks for 2 hour scheduled touch-up for areolar coloring, nipple coloring, nipple shading, and fine detailing including possible Montgomery tubercles.

## 2022-01-13 ENCOUNTER — Ambulatory Visit (INDEPENDENT_AMBULATORY_CARE_PROVIDER_SITE_OTHER): Payer: 59 | Admitting: Physician Assistant

## 2022-01-13 DIAGNOSIS — C50411 Malignant neoplasm of upper-outer quadrant of right female breast: Secondary | ICD-10-CM

## 2022-01-13 DIAGNOSIS — Z9013 Acquired absence of bilateral breasts and nipples: Secondary | ICD-10-CM

## 2022-01-13 DIAGNOSIS — Z170 Estrogen receptor positive status [ER+]: Secondary | ICD-10-CM

## 2022-01-13 DIAGNOSIS — Z853 Personal history of malignant neoplasm of breast: Secondary | ICD-10-CM | POA: Diagnosis not present

## 2022-01-25 ENCOUNTER — Ambulatory Visit: Payer: 59 | Attending: General Surgery

## 2022-01-25 VITALS — Wt 145.2 lb

## 2022-01-25 DIAGNOSIS — Z483 Aftercare following surgery for neoplasm: Secondary | ICD-10-CM | POA: Insufficient documentation

## 2022-01-25 NOTE — Therapy (Signed)
OUTPATIENT PHYSICAL THERAPY SOZO SCREENING NOTE   Patient Name: Jackie Miller MRN: [id number] DOB:[birth year], 41 y.o., female Today's Date: 01/25/2022  PCP: Merryl Hacker, No REFERRING PROVIDER: Jovita Kussmaul, MD   PT End of Session - 01/25/22 0805     Visit Number 3   # unchanged due to screen only   PT Start Time 0804    PT Stop Time 0809    PT Time Calculation (min) 5 min    Activity Tolerance Patient tolerated treatment well    Behavior During Therapy WFL for tasks assessed/performed             Past Medical History:  Diagnosis Date   Asthma    Asthma    Bursitis    right hip   Cancer ([place])    Breast   Chicken pox    Family history of adverse reaction to anesthesia    Father - slow to wake   FH: colon cancer    mom died age 25 yo   Hay fever    Headache    tension/cluster   Jaundice    as a baby   Motion sickness    moving vehicles   PCOS (polycystic ovarian syndrome)    PONV (postoperative nausea and vomiting)    Wears contact lenses    Past Surgical History:  Procedure Laterality Date   BREAST BIOPSY Right 04/24/2021   BREAST BIOPSY Left 05/25/2021   BREAST RECONSTRUCTION WITH PLACEMENT OF TISSUE EXPANDER AND FLEX HD (ACELLULAR HYDRATED DERMIS) Bilateral 06/29/2021   Procedure: BILATERAL BREAST RECONSTRUCTION WITH PLACEMENT OF TISSUE EXPANDER AND DERMAL MATRIX SUBSTITUTE;  Surgeon: Wallace Going, DO;  Location: [facility];  Service: Plastics;  Laterality: Bilateral;   COLONOSCOPY WITH PROPOFOL N/A 04/10/2019   Procedure: COLONOSCOPY WITH PROPOFOL;  Surgeon: Lucilla Lame, MD;  Location: [facility];  Service: Endoscopy;  Laterality: N/A;   EYE SURGERY Bilateral 02/16/2004   lasik's   MASTECTOMY W/ SENTINEL NODE BIOPSY Right 06/29/2021   Procedure: RIGHT MASTECTOMY WITH SENTINEL LYMPH NODE BIOPSY;  Surgeon: Jovita Kussmaul, MD;  Location: [facility];  Service: General;  Laterality: Right;   MYRINGOTOMY WITH TUBE  PLACEMENT     REMOVAL OF BILATERAL TISSUE EXPANDERS WITH PLACEMENT OF BILATERAL BREAST IMPLANTS Bilateral 09/09/2021   Procedure: REMOVAL OF BILATERAL TISSUE EXPANDERS WITH PLACEMENT OF BILATERAL BREAST IMPLANTS;  Surgeon: Wallace Going, DO;  Location: [facility];  Service: Plastics;  Laterality: Bilateral;  2 hours   TONSILLECTOMY AND ADENOIDECTOMY     TOTAL MASTECTOMY Left 06/29/2021   Procedure: LEFT TOTAL MASTECTOMY;  Surgeon: Jovita Kussmaul, MD;  Location: [facility];  Service: General;  Laterality: Left;   Patient Active Problem List   Diagnosis Date Noted   Breast cancer ([place]) 06/29/2021   Genetic testing 05/06/2021   Malignant neoplasm of upper-outer quadrant of right breast in female, estrogen receptor positive ([place]) 05/01/2021   COVID-19 07/02/2020   Cough 07/02/2020   Annual physical exam 10/08/2019   Back pain 07/11/2019   Family history of malignant neoplasm of gastrointestinal tract    Family history of colon cancer requiring screening colonoscopy 02/26/2019   Acute bronchitis 01/28/2016   Chronic fatigue 04/27/2015   PCOS (polycystic ovarian syndrome) 04/27/2015   Intolerance to cold 04/27/2015   Asthmatic bronchitis 04/27/2015    REFERRING DIAG: right breast cancer at risk for lymphedema  THERAPY DIAG:Aftercare following surgery for neoplasm  PERTINENT HISTORY: Patient was diagnosed  on 03/19/2021 with right grade II invasive ductal carcinoma breast cancer. She underwent a bilateral mastectomy with a right sentinel node biopsy (4 negative nodes) on 06/29/2021 with expanders placed at that time for reconstruction. It is ER/PR positive and HER2 negative with a Ki67 of 2%.   PRECAUTIONS: right UE Lymphedema risk  SUBJECTIVE: No complaints. Pt returns for her 3 month L-Dex screen.   PAIN:  Are you having pain? No  SOZO SCREENING: Patient was assessed today using the SOZO machine to determine the lymphedema index score. This was  compared to her baseline score. It was determined that she is within the recommended range when compared to her baseline and no further action is needed at this time. She will continue SOZO screenings. These are done every 3 months for 2 years post operatively followed by every 6 months for 2 years, and then annually.   L-DEX FLOWSHEETS - 01/25/22 0800       L-DEX LYMPHEDEMA SCREENING   Measurement Type Unilateral    L-DEX MEASUREMENT EXTREMITY Upper Extremity    POSITION  Standing    DOMINANT SIDE Left    At Risk Side Right    BASELINE SCORE (UNILATERAL) 2    L-DEX SCORE (UNILATERAL) -1    VALUE CHANGE (UNILAT) -3              Otelia Limes, PTA 01/25/2022, 8:08 AM

## 2022-02-24 ENCOUNTER — Ambulatory Visit (INDEPENDENT_AMBULATORY_CARE_PROVIDER_SITE_OTHER): Payer: 59 | Admitting: Physician Assistant

## 2022-02-24 ENCOUNTER — Encounter: Payer: 59 | Admitting: Physician Assistant

## 2022-02-24 DIAGNOSIS — Z9889 Other specified postprocedural states: Secondary | ICD-10-CM

## 2022-02-24 DIAGNOSIS — C50411 Malignant neoplasm of upper-outer quadrant of right female breast: Secondary | ICD-10-CM | POA: Diagnosis not present

## 2022-02-24 DIAGNOSIS — Z170 Estrogen receptor positive status [ER+]: Secondary | ICD-10-CM | POA: Diagnosis not present

## 2022-02-24 DIAGNOSIS — Z9013 Acquired absence of bilateral breasts and nipples: Secondary | ICD-10-CM | POA: Diagnosis not present

## 2022-02-24 NOTE — Progress Notes (Signed)
NIPPLE AREOLAR TATTOO PROCEDURE  PREOPERATIVE DIAGNOSIS:  Acquired absence of bilateral nipple areolar    POSTOPERATIVE DIAGNOSIS: Acquired absence of bilateral nipple areolar     PROCEDURES: Bilateral nipple areolar tattoo    ANESTHESIA:  EMLA, 4% Lidocaine with epinephrine   COMPLICATIONS: None.  JUSTIFICATION FOR PROCEDURE:  Jackie Miller is a 42 y.o. female with a history of breast cancer status post breast reconstruction with submuscular implants. The Jackie Miller presents for nipple areolar complex tattoo. Risks, benefits, indications, and alternatives of the above described procedures were discussed with the Jackie Miller and all the Jackie Miller's questions were answered.   01/13/22 Jackie Miller's areolas were pink prior to her double mastectomy. She understands that our tattoo ink will require combination of peach and tans to achieve the outcome that she would prefer.  Areola appears slightly more pale in comparison the nipple. Thin black shading circumferentially around nipple, predominantly inferiorly, is seen in her preoperative photos.  Will try to mimic the projection.   After mixing the various ink color combinations, Jackie Miller feels as though the light tan/peach areola will appear more natural.  We also discussed a darker nipple versus lighter nipple, decided to go with a selection that was a bit brighter and lighter to help with contrast, particularly after we do the surrounding nipple shading at subsequent encounter.  02/24/22 Jackie Miller tells that she is pleased with the shape, location, and color.  However, there was considerable fading since her initial tattoo.  She is hoping for repeat areolar tattooing in addition to nipple fill and shading.  No other changes in interim health.  DESCRIPTION OF PROCEDURE: After written informed consent was obtained and proper identification of Jackie Miller and surgical site was made, the Jackie Miller was taken to the procedure room and placed supine on the operating room  table. A time out was performed to confirm Jackie Miller's identity and surgical site. The Jackie Miller was prepped and draped in the usual sterile fashion. Alcohol swabs were used. Attention was turned to the selection of flesh colored permanent tattoo ink to produce the appropriate color for nipple areolar complex tattooing.  Nipple position was then chosen with Jackie Miller.  Using a digital revo 7R tattoo head, pointillism pigment was instilled to the designed nipple areolar complex which was confirmed preoperatively with the Jackie Miller using the Digital-Pop Deluxe machine by [employer]. Spiral technique was used to fill out the areola.  The nipples were then tattooed with a slightly lighter color, as agreed upon with the Jackie Miller.  Then used a 1P tattoo head to do shading around the nipple.  In addition to a single line around the circumference of nipple, did some additional lines around the periphery for added effect, as agreed upon with the Jackie Miller.  Once adequate pigment had been applied, Xeroform dressing was applied followed by 4 x 4 gauze and secured with Medipore tape. There were no complications and the procedure was tolerated without difficulty.    Areola-color: Bright Peach (2), Fair Peach (2), Cool Honey (1), and Fair Honey (1) in 2:2:1:1 ratio respectively.  Nipple color: Bright Peach (1) and Fair Peach (1) in 1:1 ratio.  Shading: Cool Peach (1), Cool Mink (1), and Cool Honey (1) in 1:1:1 ratio.   Colors EXP: 02/2024  Nipples are each approximately 3.5 cm diameter at conclusion of visit. Return in 8-12 weeks for 1 hour scheduled touch-up for Montgomery tubercles, nipple shading, and possible nipple coloring.

## 2022-02-26 ENCOUNTER — Encounter: Payer: Self-pay | Admitting: Adult Health

## 2022-04-01 ENCOUNTER — Encounter: Payer: Self-pay | Admitting: Physical Therapy

## 2022-04-06 NOTE — Progress Notes (Unsigned)
New patient visit   Patient: Jackie Miller   DOB: 22-May-1980   42 y.o. Female  MRN: [id number] Visit Date: 04/07/2022  Today's healthcare provider: Eulis Foster, MD   No chief complaint on file.  Subjective    Jackie Miller is a 42 y.o. female who presents today as a new patient to establish care.  HPI  ***  Past Medical History:  Diagnosis Date   Asthma    Asthma    Bursitis    right hip   Cancer ([place])    Breast   Chicken pox    Family history of adverse reaction to anesthesia    Father - slow to wake   FH: colon cancer    mom died age 23 yo   Hay fever    Headache    tension/cluster   Jaundice    as a baby   Motion sickness    moving vehicles   PCOS (polycystic ovarian syndrome)    PONV (postoperative nausea and vomiting)    Wears contact lenses    Past Surgical History:  Procedure Laterality Date   BREAST BIOPSY Right 04/24/2021   BREAST BIOPSY Left 05/25/2021   BREAST RECONSTRUCTION WITH PLACEMENT OF TISSUE EXPANDER AND FLEX HD (ACELLULAR HYDRATED DERMIS) Bilateral 06/29/2021   Procedure: BILATERAL BREAST RECONSTRUCTION WITH PLACEMENT OF TISSUE EXPANDER AND DERMAL MATRIX SUBSTITUTE;  Surgeon: Wallace Going, DO;  Location: [facility];  Service: Plastics;  Laterality: Bilateral;   COLONOSCOPY WITH PROPOFOL N/A 04/10/2019   Procedure: COLONOSCOPY WITH PROPOFOL;  Surgeon: Lucilla Lame, MD;  Location: [facility];  Service: Endoscopy;  Laterality: N/A;   EYE SURGERY Bilateral 02/16/2004   lasik's   MASTECTOMY W/ SENTINEL NODE BIOPSY Right 06/29/2021   Procedure: RIGHT MASTECTOMY WITH SENTINEL LYMPH NODE BIOPSY;  Surgeon: Jovita Kussmaul, MD;  Location: [facility];  Service: General;  Laterality: Right;   MYRINGOTOMY WITH TUBE PLACEMENT     REMOVAL OF BILATERAL TISSUE EXPANDERS WITH PLACEMENT OF BILATERAL BREAST IMPLANTS Bilateral 09/09/2021   Procedure: REMOVAL OF BILATERAL TISSUE EXPANDERS WITH PLACEMENT OF  BILATERAL BREAST IMPLANTS;  Surgeon: Wallace Going, DO;  Location: [facility];  Service: Plastics;  Laterality: Bilateral;  2 hours   TONSILLECTOMY AND ADENOIDECTOMY     TOTAL MASTECTOMY Left 06/29/2021   Procedure: LEFT TOTAL MASTECTOMY;  Surgeon: Jovita Kussmaul, MD;  Location: [facility];  Service: General;  Laterality: Left;   Family Status  Relation Name Status   Mother  Deceased   Father  Alive   MGM  Deceased   PGF  Deceased   Cousin  Deceased   Brother  Alive       younger   Family History  Problem Relation Age of Onset   Colon cancer Mother 24   Hypertension Father    Breast cancer Maternal Grandmother    Lung cancer Paternal Grandfather    Breast cancer Cousin    Social History   Socioeconomic History   Marital status: Single    Spouse name: Not on file   Number of children: Not on file   Years of education: Not on file   Highest education level: Not on file  Occupational History   Not on file  Tobacco Use   Smoking status: Never   Smokeless tobacco: Never  Vaping Use   Vaping Use: Never used  Substance and Sexual Activity   Alcohol use: No  Alcohol/week: 0.0 - 1.0 standard drinks of alcohol   Drug use: No   Sexual activity: Not Currently  Other Topics Concern   Not on file  Social History Narrative   Single w/o kids as of 02/23/19    No pets    From [facility]-    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   montelukast (SINGULAIR) 10 MG tablet Take 10 mg by mouth at  bedtime. (Patient not taking: Reported on 12/28/2021)   PROAIR HFA 108 (90 Base) MCG/ACT inhaler Inhale 2 puffs into the lungs every 6 (six) hours as needed.   tamoxifen (NOLVADEX) 20 MG tablet Take 1 tablet (20 mg total) by mouth daily.   No facility-administered medications prior to visit.   Allergies  Allergen Reactions   Bacitracin Itching and Rash   Amoxicillin Swelling   Doxycycline Other (See Comments)    Muscloskeletal pain    Immunization History  Administered Date(s) Administered   Influenza,inj,Quad PF,6+ Mos 04/18/2015   Tdap 04/18/2015    Health Maintenance  Topic Date Due   COVID-19 Vaccine (1) Never done   HIV Screening  Never done   Hepatitis C Screening  Never done   PAP SMEAR-Modifier  11/19/2019   INFLUENZA VACCINE  09/15/2021   COLONOSCOPY (Pts 45-14yr Insurance coverage will need to be confirmed)  04/09/2024   DTaP/Tdap/Td (2 - Td or Tdap) 04/17/2025   HPV VACCINES  Aged Out    Patient Care Team: Pcp, No as PCP - General TJovita Kussmaul MD as Consulting Physician (General Surgery) GNicholas Lose MD as Consulting Physician (Hematology and Oncology) KGery Pray MD as Consulting Physician (Radiation Oncology) [place], CLoel Lofty DO as Attending Physician (Plastic Surgery)  Review of Systems  {Labs  Heme  Chem  Endocrine  Serology  Results Review (optional):23779}   Objective    There were no vitals taken for this visit. {Show previous vital signs (optional):23777}  Physical Exam ***  Depression Screen    07/02/2020    7:47 AM 10/04/2019    3:45 PM 07/11/2019   11:15 AM 02/23/2019    4:00 PM  PHQ 2/9 Scores  PHQ - 2 Score 0 0 0 0   No results found for any visits on 04/07/22.  Assessment & Plan      Problem List Items Addressed This Visit   None    No follow-ups on file.      The entirety of the information documented in the History of Present Illness, Review of Systems and Physical Exam were personally obtained by me.  Portions of this information were initially documented by *** and reviewed by me for thoroughness and accuracy.MEulis Foster MD     MEulis Foster MD  [phone](phone) [phone](fax)  [place]

## 2022-04-07 ENCOUNTER — Encounter: Payer: Self-pay | Admitting: Family Medicine

## 2022-04-07 ENCOUNTER — Ambulatory Visit (INDEPENDENT_AMBULATORY_CARE_PROVIDER_SITE_OTHER): Payer: 59 | Admitting: Family Medicine

## 2022-04-07 VITALS — BP 116/78 | HR 97 | Resp 16 | Ht 67.0 in | Wt 152.8 lb

## 2022-04-07 DIAGNOSIS — E282 Polycystic ovarian syndrome: Secondary | ICD-10-CM

## 2022-04-07 DIAGNOSIS — J4520 Mild intermittent asthma, uncomplicated: Secondary | ICD-10-CM | POA: Diagnosis not present

## 2022-04-07 DIAGNOSIS — Z7689 Persons encountering health services in other specified circumstances: Secondary | ICD-10-CM

## 2022-04-07 DIAGNOSIS — R5383 Other fatigue: Secondary | ICD-10-CM | POA: Diagnosis not present

## 2022-04-07 DIAGNOSIS — E559 Vitamin D deficiency, unspecified: Secondary | ICD-10-CM

## 2022-04-07 DIAGNOSIS — Z0000 Encounter for general adult medical examination without abnormal findings: Secondary | ICD-10-CM

## 2022-04-07 NOTE — Patient Instructions (Addendum)
It was a pleasure to see you today!  Thank you for choosing [facility] for your primary care.   Jackie Miller was seen for establishing care and low energy.   Our plans for today were: We will check your thyroid levels, vitamin D and  b12 and hemoglobin/iron level We will follow up with results of labs once they are available.    To keep you healthy, please keep in mind the following health maintenance items that you are due for:   HIV screening  Hepatitis C screening  Influenza vaccine  Covid vaccine    You should return to our clinic in 4 months for physical .   Best Wishes,   Dr. Quentin Cornwall

## 2022-04-08 ENCOUNTER — Encounter: Payer: Self-pay | Admitting: Family Medicine

## 2022-04-08 DIAGNOSIS — R5383 Other fatigue: Secondary | ICD-10-CM | POA: Insufficient documentation

## 2022-04-08 DIAGNOSIS — E559 Vitamin D deficiency, unspecified: Secondary | ICD-10-CM | POA: Insufficient documentation

## 2022-04-08 DIAGNOSIS — Z0000 Encounter for general adult medical examination without abnormal findings: Secondary | ICD-10-CM | POA: Insufficient documentation

## 2022-04-08 DIAGNOSIS — Z7689 Persons encountering health services in other specified circumstances: Secondary | ICD-10-CM | POA: Insufficient documentation

## 2022-04-08 LAB — CBC
Hematocrit: 37.4 % (ref 34.0–46.6)
Hemoglobin: 12.2 g/dL (ref 11.1–15.9)
MCH: 28.6 pg (ref 26.6–33.0)
MCHC: 32.6 g/dL (ref 31.5–35.7)
MCV: 88 fL (ref 79–97)
Platelets: 260 10*3/uL (ref 150–450)
RBC: 4.26 x10E6/uL (ref 3.77–5.28)
RDW: 12.2 % (ref 11.7–15.4)
WBC: 6.3 10*3/uL (ref 3.4–10.8)

## 2022-04-08 LAB — VITAMIN B12: Vitamin B-12: 270 pg/mL (ref 232–1245)

## 2022-04-08 LAB — VITAMIN D 25 HYDROXY (VIT D DEFICIENCY, FRACTURES): Vit D, 25-Hydroxy: 14.7 ng/mL — ABNORMAL LOW (ref 30.0–100.0)

## 2022-04-08 LAB — TSH+FREE T4
Free T4: 1.02 ng/dL (ref 0.82–1.77)
TSH: 1.8 u[IU]/mL (ref 0.450–4.500)

## 2022-04-08 LAB — FERRITIN: Ferritin: 74 ng/mL (ref 15–150)

## 2022-04-08 NOTE — Assessment & Plan Note (Signed)
Chronic problem  Will check TSH,free T4 and Vitamin B12 and D levels  Will also check CBC

## 2022-04-08 NOTE — Assessment & Plan Note (Signed)
Welcomed patient to [facility]  Reviewed patient's medical history, medications, surgical and social history Discussed roles and expectations primary care physician-patient relationship

## 2022-04-08 NOTE — Assessment & Plan Note (Signed)
Chronic problem  Symptoms are well controlled Patient rarely needs albuterol rescue inhaler unless she has URI  No changes today, continue albuterol PRN

## 2022-04-08 NOTE — Assessment & Plan Note (Signed)
Chronic  Will recheck vitamin D levels today  She is not currently on supplementation

## 2022-04-08 NOTE — Assessment & Plan Note (Signed)
Chronic  Not currently on OCP  Still having light menses

## 2022-04-08 NOTE — Assessment & Plan Note (Signed)
Patient recommended for COVID vaccine, HIV screening, Hep C screening, influneza vaccine as well as pap smear (received at [facility])

## 2022-04-14 NOTE — Progress Notes (Signed)
Patient Care Team: Eulis Foster, MD as PCP - General (Family Medicine) Jovita Kussmaul, MD as Consulting Physician (General Surgery) Nicholas Lose, MD as Consulting Physician (Hematology and Oncology) Gery Pray, MD as Consulting Physician (Radiation Oncology) Dillingham, Loel Lofty, DO as Attending Physician (Plastic Surgery)  DIAGNOSIS: No diagnosis found.  SUMMARY OF ONCOLOGIC HISTORY: Oncology History  Malignant neoplasm of upper-outer quadrant of right breast in female, estrogen receptor positive ([place])  04/24/2021 Initial Diagnosis   Screening mammogram detected right breast calcifications and distortion, ultrasound UOQ 10:00: 1.6 cm biopsy grade 2 IDC ER 70%, PR greater than 95%, HER2 negative, Ki-67 2%, calcs spanned 7 cm: Biopsy intermediate grade DCIS ER 95%, PR 95%   05/06/2021 Cancer Staging   Staging form: Breast, AJCC 8th Edition - Clinical stage from 05/06/2021: Stage IA (cT1b, cN0, cM0, G2, ER+, PR+, HER2-) - Signed by Nicholas Lose, MD on 05/06/2021 Stage prefix: Initial diagnosis Histologic grading system: 3 grade system   06/2021 -  Anti-estrogen oral therapy   Tamoxifen x 10 years   06/29/2021 Surgery   A. BREAST, LEFT, MASTECTOMY:  - Fibrocystic changes with rare calcifications  - Small incidental fibroadenoma, 0.6 cm  - Biopsy site changes  - Negative for carcinoma   B. BREAST, RIGHT, MASTECTOMY:  - Invasive ductal carcinoma, 2.0 cm, grade 1 and ductal carcinoma in situ (ribbon shaped clip)  - Separate focus of patchy ductal carcinoma in situ, intermediate grade, involving an area of fibrocystic change spanning about 7 cm (X shaped clip)  - Invasive carcinoma is 0.2 cm from anterior margin  - Biopsy site changes  - See oncology table   C. LYMPH NODE, RIGHT AXILLARY #1, SENTINEL, EXCISION:  - Lymph node, negative for carcinoma (0/1)   D. LYMPH NODE, RIGHT AXILLARY #2, SENTINEL, EXCISION:  - Lymph node, negative for carcinoma (0/1)   E. LYMPH  NODE, RIGHT AXILLARY, SENTINEL, EXCISION:  - Lymph node, negative for carcinoma (0/1)   F. LYMPH NODE, RIGHT AXILLARY, SENTINEL, EXCISION:  - Lymph node, negative for carcinoma (0/1)      CHIEF COMPLIANT: Follow-up breast cancer on tamoxifen  INTERVAL HISTORY: Jackie Miller is a 42 y.o. female is here because of recent diagnosis of right Breast cancer. She presents to the clinic today for a follow-up    ALLERGIES:  is allergic to bacitracin, amoxicillin, and doxycycline.  MEDICATIONS:  Current Outpatient Medications  Medication Sig Dispense Refill   cetirizine (ZYRTEC) 10 MG chewable tablet Chew 10 mg by mouth daily.     montelukast (SINGULAIR) 10 MG tablet Take 10 mg by mouth at bedtime.     PROAIR HFA 108 (90 Base) MCG/ACT inhaler Inhale 2 puffs into the lungs every 6 (six) hours as needed. 18 g 1   tamoxifen (NOLVADEX) 20 MG tablet Take 1 tablet (20 mg total) by mouth daily. 90 tablet 3   No current facility-administered medications for this visit.    PHYSICAL EXAMINATION: ECOG PERFORMANCE STATUS: {CHL ONC ECOG [phone]}  There were no vitals filed for this visit. There were no vitals filed for this visit.  BREAST:*** No palpable masses or nodules in either right or left breasts. No palpable axillary supraclavicular or infraclavicular adenopathy no breast tenderness or nipple discharge. (exam performed in the presence of a chaperone)  LABORATORY DATA:  I have reviewed the data as listed    Latest Ref Rng & Units 05/06/2021   12:24 PM 10/04/2019    4:23 PM 08/29/2018    8:20 AM  CMP  Glucose 70 - 99 mg/dL 91  90  80   BUN 6 - 20 mg/dL '11  12  7   '$ Creatinine 0.44 - 1.00 mg/dL 0.73  0.87  0.90   Sodium 135 - 145 mmol/L 140  140  142   Potassium 3.5 - 5.1 mmol/L 4.4  4.3  4.6   Chloride 98 - 111 mmol/L 107  107  107   CO2 22 - 32 mmol/L '26  26  20   '$ Calcium 8.9 - 10.3 mg/dL 9.7  9.4  9.3   Total Protein 6.5 - 8.1 g/dL 6.7  6.6  6.2   Total Bilirubin 0.3 - 1.2 mg/dL  0.8  0.4  0.5   Alkaline Phos 38 - 126 U/L 43  40  45   AST 15 - 41 U/L '10  12  10   '$ ALT 0 - 44 U/L '7  8  8     '$ Lab Results  Component Value Date   WBC 6.3 04/07/2022   HGB 12.2 04/07/2022   HCT 37.4 04/07/2022   MCV 88 04/07/2022   PLT 260 04/07/2022   NEUTROABS 5.5 05/06/2021    ASSESSMENT & PLAN:  No problem-specific Assessment & Plan notes found for this encounter.    No orders of the defined types were placed in this encounter.  The patient has a good understanding of the overall plan. she agrees with it. she will call with any problems that may develop before the next visit here. Total time spent: 30 mins including face to face time and time spent for planning, charting and co-ordination of care   Suzzette Righter, [place] 04/14/22    I Gardiner Coins am acting as a Education administrator for [employer]  ***

## 2022-04-15 ENCOUNTER — Inpatient Hospital Stay: Payer: 59 | Attending: Hematology and Oncology | Admitting: Hematology and Oncology

## 2022-04-15 ENCOUNTER — Other Ambulatory Visit: Payer: Self-pay

## 2022-04-15 VITALS — BP 120/59 | HR 70 | Temp 97.5°F | Resp 18 | Ht 67.0 in | Wt 154.4 lb

## 2022-04-15 DIAGNOSIS — Z9013 Acquired absence of bilateral breasts and nipples: Secondary | ICD-10-CM | POA: Diagnosis not present

## 2022-04-15 DIAGNOSIS — Z79810 Long term (current) use of selective estrogen receptor modulators (SERMs): Secondary | ICD-10-CM | POA: Insufficient documentation

## 2022-04-15 DIAGNOSIS — Z170 Estrogen receptor positive status [ER+]: Secondary | ICD-10-CM

## 2022-04-15 DIAGNOSIS — C50411 Malignant neoplasm of upper-outer quadrant of right female breast: Secondary | ICD-10-CM | POA: Diagnosis present

## 2022-04-15 MED ORDER — TAMOXIFEN CITRATE 20 MG PO TABS: 20.0000 mg | ORAL_TABLET | Freq: Every day | ORAL | 3 refills | Status: DC

## 2022-04-15 NOTE — Assessment & Plan Note (Addendum)
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  Family decided on tamoxifen instead.   Tamoxifen toxicities: Very mild hot flashes but otherwise tolerating it extremely well.  No issues with menstrual cycles.  She thinks it is lightened up a bit.  Breast cancer surveillance: Breast exam 04/15/2022: Benign No role of imaging since she had bilateral mastectomies  Return to clinic in 1 year for follow-up

## 2022-04-26 ENCOUNTER — Ambulatory Visit: Payer: 59 | Attending: General Surgery

## 2022-04-26 VITALS — Wt 154.0 lb

## 2022-04-26 DIAGNOSIS — Z483 Aftercare following surgery for neoplasm: Secondary | ICD-10-CM | POA: Insufficient documentation

## 2022-04-26 NOTE — Therapy (Signed)
OUTPATIENT PHYSICAL THERAPY SOZO SCREENING NOTE   Patient Name: Jackie Miller MRN: [id number] DOB:09-26-1980, 42 y.o., female Today's Date: 04/26/2022  PCP: Eulis Foster, MD REFERRING PROVIDER: Jovita Kussmaul, MD   PT End of Session - 04/26/22 [id number]     Visit Number 3   # unchanged due to screen only   PT Start Time 0819    PT Stop Time 0824    PT Time Calculation (min) 5 min    Activity Tolerance Patient tolerated treatment well    Behavior During Therapy WFL for tasks assessed/performed             Past Medical History:  Diagnosis Date   Allergy    Asthma    Asthma    Bursitis    right hip   Cancer ([place])    Breast   Chicken pox    Family history of adverse reaction to anesthesia    Father - slow to wake   FH: colon cancer    mom died age 26 yo   Hay fever    Headache    tension/cluster   Jaundice    as a baby   Motion sickness    moving vehicles   PCOS (polycystic ovarian syndrome)    PONV (postoperative nausea and vomiting)    Wears contact lenses    Past Surgical History:  Procedure Laterality Date   BREAST BIOPSY Right 04/24/2021   BREAST BIOPSY Left 05/25/2021   BREAST RECONSTRUCTION WITH PLACEMENT OF TISSUE EXPANDER AND FLEX HD (ACELLULAR HYDRATED DERMIS) Bilateral 06/29/2021   Procedure: BILATERAL BREAST RECONSTRUCTION WITH PLACEMENT OF TISSUE EXPANDER AND DERMAL MATRIX SUBSTITUTE;  Surgeon: Wallace Going, DO;  Location: [facility];  Service: Plastics;  Laterality: Bilateral;   COLONOSCOPY WITH PROPOFOL N/A 04/10/2019   Procedure: COLONOSCOPY WITH PROPOFOL;  Surgeon: Lucilla Lame, MD;  Location: [facility];  Service: Endoscopy;  Laterality: N/A;   EYE SURGERY Bilateral 02/16/2004   lasik's   MASTECTOMY W/ SENTINEL NODE BIOPSY Right 06/29/2021   Procedure: RIGHT MASTECTOMY WITH SENTINEL LYMPH NODE BIOPSY;  Surgeon: Jovita Kussmaul, MD;  Location: [facility];  Service: General;  Laterality: Right;    MYRINGOTOMY WITH TUBE PLACEMENT     REMOVAL OF BILATERAL TISSUE EXPANDERS WITH PLACEMENT OF BILATERAL BREAST IMPLANTS Bilateral 09/09/2021   Procedure: REMOVAL OF BILATERAL TISSUE EXPANDERS WITH PLACEMENT OF BILATERAL BREAST IMPLANTS;  Surgeon: Wallace Going, DO;  Location: [facility];  Service: Plastics;  Laterality: Bilateral;  2 hours   TONSILLECTOMY AND ADENOIDECTOMY     TOTAL MASTECTOMY Left 06/29/2021   Procedure: LEFT TOTAL MASTECTOMY;  Surgeon: Jovita Kussmaul, MD;  Location: [facility];  Service: General;  Laterality: Left;   Patient Active Problem List   Diagnosis Date Noted   Encounter to establish care 04/08/2022   Decreased energy 04/08/2022   Healthcare maintenance 04/08/2022   Vitamin D deficiency 04/08/2022   Breast cancer ([place]) 06/29/2021   Easy bruising 05/06/2021   Malignant neoplasm of upper-outer quadrant of right breast in female, estrogen receptor positive ([place]) 05/01/2021   Chondromalacia of right patella 01/28/2021   Family history of colon cancer requiring screening colonoscopy 02/26/2019   PCOS (polycystic ovarian syndrome) 04/27/2015   Intolerance to cold 04/27/2015   Asthma 04/27/2015    REFERRING DIAG: right breast cancer at risk for lymphedema  THERAPY DIAG:Aftercare following surgery for neoplasm  PERTINENT HISTORY: Patient was diagnosed on 03/19/2021 with right grade II  invasive ductal carcinoma breast cancer. She underwent a bilateral mastectomy with a right sentinel node biopsy (4 negative nodes) on 06/29/2021 with expanders placed at that time for reconstruction. It is ER/PR positive and HER2 negative with a Ki67 of 2%.   PRECAUTIONS: right UE Lymphedema risk  SUBJECTIVE: No complaints. Pt returns for her 3 month L-Dex screen.   PAIN:  Are you having pain? No  SOZO SCREENING: Patient was assessed today using the SOZO machine to determine the lymphedema index score. This was compared to her baseline score. It  was determined that she is within the recommended range when compared to her baseline and no further action is needed at this time. She will continue SOZO screenings. These are done every 3 months for 2 years post operatively followed by every 6 months for 2 years, and then annually.   L-DEX FLOWSHEETS - 04/26/22 0800       L-DEX LYMPHEDEMA SCREENING   Measurement Type Unilateral    L-DEX MEASUREMENT EXTREMITY Upper Extremity    POSITION  Standing    DOMINANT SIDE Left    At Risk Side Right    BASELINE SCORE (UNILATERAL) 2    L-DEX SCORE (UNILATERAL) 2.3    VALUE CHANGE (UNILAT) 0.3              Otelia Limes, PTA 04/26/2022, 8:23 AM

## 2022-04-27 NOTE — Progress Notes (Unsigned)
NIPPLE AREOLAR TATTOO PROCEDURE   PREOPERATIVE DIAGNOSIS:  Acquired absence of bilateral nipple areolar    POSTOPERATIVE DIAGNOSIS: Acquired absence of bilateral nipple areolar     PROCEDURES: Bilateral nipple areolar tattoo    ANESTHESIA:  EMLA, 4% Lidocaine with epinephrine   COMPLICATIONS: None.   JUSTIFICATION FOR PROCEDURE:  Jackie Miller is a 42 y.o. female with a history of breast cancer status post breast reconstruction with submuscular implants. The patient presents for nipple areolar complex tattoo. Risks, benefits, indications, and alternatives of the above described procedures were discussed with the patient and all the patient's questions were answered.    01/13/22 Patient's areolas were pink prior to her double mastectomy. She understands that our tattoo ink will require combination of peach and tans to achieve the outcome that she would prefer.  Areola appears slightly more pale in comparison the nipple. Thin black shading circumferentially around nipple, predominantly inferiorly, is seen in her preoperative photos.  Will try to mimic the projection.   After mixing the various ink color combinations, patient feels as though the light tan/peach areola will appear more natural.  We also discussed a darker nipple versus lighter nipple, decided to go with a selection that was a bit brighter and lighter to help with contrast, particularly after we do the surrounding nipple shading at subsequent encounter.   02/24/22 Patient tells that she is pleased with the shape, location, and color.  However, there was considerable fading since her initial tattoo.  She is hoping for repeat areolar tattooing in addition to nipple fill and shading.  No other changes in interim health.  04/27/22 Patient is hoping that she can have slight darkening of her areola and nipple as well as creation of halo around the nipple for improved 3D projection. ***   DESCRIPTION OF PROCEDURE: After written informed  consent was obtained and proper identification of patient and surgical site was made, the patient was taken to the procedure room and placed supine on the operating room table. A time out was performed to confirm patient's identity and surgical site. The patient was prepped and draped in the usual sterile fashion. Alcohol swabs were used. Attention was turned to the selection of flesh colored permanent tattoo ink to produce the appropriate color for nipple areolar complex tattooing.  Nipple position was then chosen with patient.  Using a digital revo 7R tattoo head, pointillism pigment was instilled to the designed nipple areolar complex which was confirmed preoperatively with the patient using the Digital-Pop Deluxe machine by [employer]. Spiral technique was used to fill out the areola.  The nipples were then tattooed with a slightly lighter color, as agreed upon with the patient.  Then used a 1P tattoo head to do shading around the nipple.  In addition to a single line around the circumference of nipple, did some additional lines around the periphery for added effect, as agreed upon with the patient.  Once adequate pigment had been applied, Xeroform dressing was applied followed by 4 x 4 gauze and secured with Medipore tape. There were no complications and the procedure was tolerated without difficulty.     Areola-color: Bright Peach (2), Fair Peach (2), Cool Honey (1), and Fair Honey (1) in 2:2:1:1 ratio respectively.  Nipple color: Bright Peach (1) and Fair Peach (1) in 1:1 ratio.  Shading: Cool Peach (1), Cool Mink (1), and Cool Honey (1) in 1:1:1 ratio.   Colors EXP: 02/2024   Nipples are each approximately 3.5 cm diameter at conclusion  of visit. Return in 8-12 weeks for 1 hour scheduled touch-up for [place] tubercles, nipple shading, and possible nipple coloring.

## 2022-04-28 ENCOUNTER — Ambulatory Visit (INDEPENDENT_AMBULATORY_CARE_PROVIDER_SITE_OTHER): Payer: 59 | Admitting: Physician Assistant

## 2022-04-28 ENCOUNTER — Encounter: Payer: Self-pay | Admitting: Physician Assistant

## 2022-04-28 VITALS — BP 113/74 | HR 82

## 2022-04-28 DIAGNOSIS — Z853 Personal history of malignant neoplasm of breast: Secondary | ICD-10-CM

## 2022-04-28 DIAGNOSIS — Z9013 Acquired absence of bilateral breasts and nipples: Secondary | ICD-10-CM

## 2022-04-28 DIAGNOSIS — Z170 Estrogen receptor positive status [ER+]: Secondary | ICD-10-CM | POA: Diagnosis not present

## 2022-04-28 DIAGNOSIS — C50411 Malignant neoplasm of upper-outer quadrant of right female breast: Secondary | ICD-10-CM

## 2022-04-28 DIAGNOSIS — Z9889 Other specified postprocedural states: Secondary | ICD-10-CM

## 2022-05-25 ENCOUNTER — Ambulatory Visit (INDEPENDENT_AMBULATORY_CARE_PROVIDER_SITE_OTHER): Payer: 59 | Admitting: Physician Assistant

## 2022-05-25 DIAGNOSIS — Z9013 Acquired absence of bilateral breasts and nipples: Secondary | ICD-10-CM

## 2022-05-25 DIAGNOSIS — Z170 Estrogen receptor positive status [ER+]: Secondary | ICD-10-CM

## 2022-05-25 DIAGNOSIS — C50411 Malignant neoplasm of upper-outer quadrant of right female breast: Secondary | ICD-10-CM

## 2022-05-25 NOTE — Progress Notes (Signed)
Patient is a pleasant 42 year old female with PMH of right-sided breast cancer s/p bilateral mastectomy and reconstruction who has been coming to clinic for nipple areolar tattoo restoration who joins via telephone for follow-up.  She last received treatment 04/27/2022.  At that time, she was hoping for more contrast between the nipple and areola.  Otherwise, she was pleased with shape and color selection.  Introduced lighter color to the nipple for contrast and even did some lightening/highlights throughout the areola.  Today, she states that a lot of the lightening and highlights faded unfortunately.  For that reason, she is still hoping for improved contrast between the nipple and areola.  She plans to go to the beach early May and will push back her next visit until a couple weeks later.  The patient was at work and this provider was calling from their office.  A total of 5 minutes was spent speaking with the patient and reviewing chart.

## 2022-06-22 ENCOUNTER — Encounter: Payer: 59 | Admitting: Physician Assistant

## 2022-06-29 ENCOUNTER — Ambulatory Visit (INDEPENDENT_AMBULATORY_CARE_PROVIDER_SITE_OTHER): Payer: 59 | Admitting: Physician Assistant

## 2022-06-29 ENCOUNTER — Encounter: Payer: Self-pay | Admitting: Physician Assistant

## 2022-06-29 VITALS — BP 109/71 | HR 77

## 2022-06-29 DIAGNOSIS — Z853 Personal history of malignant neoplasm of breast: Secondary | ICD-10-CM | POA: Diagnosis not present

## 2022-06-29 DIAGNOSIS — Z170 Estrogen receptor positive status [ER+]: Secondary | ICD-10-CM | POA: Diagnosis not present

## 2022-06-29 DIAGNOSIS — Z9013 Acquired absence of bilateral breasts and nipples: Secondary | ICD-10-CM

## 2022-06-29 NOTE — Progress Notes (Signed)
NIPPLE AREOLAR TATTOO PROCEDURE   PREOPERATIVE DIAGNOSIS:  Acquired absence of bilateral nipple areolar    POSTOPERATIVE DIAGNOSIS: Acquired absence of bilateral nipple areolar     PROCEDURES: Bilateral nipple areolar tattoo    ANESTHESIA:  EMLA, 4% Lidocaine with epinephrine   COMPLICATIONS: None.   JUSTIFICATION FOR PROCEDURE:  Jackie Miller is a 42 y.o. female with a history of breast cancer status post breast reconstruction with submuscular implants. The patient presents for nipple areolar complex tattoo. Risks, benefits, indications, and alternatives of the above described procedures were discussed with the patient and all the patient's questions were answered.    01/13/22 Patient's areolas were pink prior to her double mastectomy. She understands that our tattoo ink will require combination of peach and tans to achieve the outcome that she would prefer.  Areola appears slightly more pale in comparison the nipple. Thin black shading circumferentially around nipple, predominantly inferiorly, is seen in her preoperative photos.  Will try to mimic the projection.   After mixing the various ink color combinations, patient feels as though the light tan/peach areola will appear more natural.  We also discussed a darker nipple versus lighter nipple, decided to go with a selection that was a bit brighter and lighter to help with contrast, particularly after we do the surrounding nipple shading at subsequent encounter.   02/24/22 Patient tells that she is pleased with the shape, location, and color.  However, there was considerable fading since her initial tattoo.  She is hoping for repeat areolar tattooing in addition to nipple fill and shading.  No other changes in interim health.   04/27/22 Patient is hoping for lightening of the nipple to create improved contrast with the remainder of her areola.  She is otherwise quite pleased with the color of the areola as well as size/shape.  She simply wants  more contrast.  Discussed lightened pigment for nipple as well as even lighter pigment for halo effect and detailing within the nipple.  05/25/2022 Called patient and she stated that much of the lightening/highlights have faded and was still hoping for improved contrast between the nipple and areola.  06/29/2022 Today, she tells me that she still has not had much improvement with regard to the contrast between nipples and areolas.  While she had previously suggested that she wanted her nipples to be slightly lighter in appearance, she now feels as though perhaps that should be darker compared to the areola.  Since last encounter, clinic received a new line of tattoo ink that is catered specifically to [place] 1-2 patients.  Discussed this with the patient and she would like to try the new colors.  After mixing couple options, can do decision.  Discussed that it may.  The slightly louder in appearance compared to the previous ink selection, but this is something that she is understanding and agreeable to.  She understands that I cannot tell her for sure how much will fade or if some of the peach ink will show through.  She still would like to proceed.   DESCRIPTION OF PROCEDURE: After written informed consent was obtained and proper identification of patient and surgical site was made, the patient was taken to the procedure room and placed supine on the operating room table. A time out was performed to confirm patient's identity and surgical site. The patient was prepped and draped in the usual sterile fashion. Alcohol swabs were used. Attention was turned to the selection of flesh colored permanent tattoo ink to produce  the appropriate color for nipple areolar complex tattooing.  Nipple position was then chosen with patient.  Using a Field seismologist by [employer], 7R needle was placed and pigment was applied to both the areola and nipple bilaterally.  Did not perform any shading or  lightening during today's tattoo encounter.  Once adequate pigment had been applied to the nipple areolar complex, Xeroform dressing was applied followed by 4 x 4 gauze and secured with Medipore tape. There were no complications and the procedure was tolerated without difficulty.     Areola-color: EA Dortha Schwalbe Areola 1 (8) mixed with EA Snow White (4) in 2:1 ratio.  Nipple color: EA [place] Nipple (4) mixed with EA Snow White (4) in 1:1 ratio.  Shading: Cool Peach (1), Cool Mink (1), and Cool Honey (1) in 1:1:1 ratio.   Lightening/highlights: Fair Peach (1) and Portrait White (4) in 1:4 ratio. Colors EXP: 04/2024   Nipples are each approximately 3.5 cm diameter at conclusion of visit. Will arrange for telephone encounter in 4 weeks to discuss results and decide on what she would like to do next, if anything.

## 2022-07-20 ENCOUNTER — Ambulatory Visit (INDEPENDENT_AMBULATORY_CARE_PROVIDER_SITE_OTHER): Payer: 59 | Admitting: Physician Assistant

## 2022-07-20 DIAGNOSIS — C50411 Malignant neoplasm of upper-outer quadrant of right female breast: Secondary | ICD-10-CM

## 2022-07-20 DIAGNOSIS — Z9013 Acquired absence of bilateral breasts and nipples: Secondary | ICD-10-CM

## 2022-07-20 DIAGNOSIS — Z170 Estrogen receptor positive status [ER+]: Secondary | ICD-10-CM

## 2022-07-20 NOTE — Progress Notes (Signed)
Patient is a pleasant 42 year old female with PMH of right-sided breast cancer s/p bilateral mastectomy and reconstruction who has been coming to clinic for nipple areolar tattoo restoration who joins via telephone for follow-up.   She last received tattoo treatment on 06/29/2022.  At that time, she still had not realized any considerable improvement with regard to the contrast between nipples and areolas.  While she had previously suggested that she wanted her nipples to be slightly lighter in appearance, she wanted to transition to a darker color centrally.  She also expressed that she like to proceed with a new tattoo ink specifically catered towards [place] 1-2 patients.  She was pleased after conclusion of visit, but plan was for reevaluation via telephone encounter before scheduling additional tattooing.  Today, patient reports that she is quite pleased with the new ink selection.  She states that it has not faded as much as we thought it might and the peach is not showing through.  She states that these colors are more consistent with what she would have anticipated given her skin type.  She also feels as though there is improved contrast which she was hoping for.  The only downside is she feels as though the right side is maybe faded a bit more compared to her left side and she is hoping that she could return to clinic and have another pass performed.  Plan for follow-up in approximately 6 weeks for NAC tattooing.  The patient was at work and this provider was calling from their office.  A total of 5 minutes was spent speaking with the patient and reviewing chart.

## 2022-08-02 ENCOUNTER — Ambulatory Visit: Payer: 59 | Attending: General Surgery

## 2022-08-02 VITALS — Wt 158.0 lb

## 2022-08-02 DIAGNOSIS — Z483 Aftercare following surgery for neoplasm: Secondary | ICD-10-CM | POA: Insufficient documentation

## 2022-08-02 NOTE — Therapy (Signed)
OUTPATIENT PHYSICAL THERAPY SOZO SCREENING NOTE   Patient Name: Jackie Miller MRN: [id number] DOB:1980/04/20, 42 y.o., female Today's Date: 08/02/2022  PCP: Ronnald Ramp, MD REFERRING PROVIDER: Griselda Miner, MD   PT End of Session - 08/02/22 [id number]     Visit Number 3   # unchanged due to screen only   PT Start Time 0833    PT Stop Time 0836    PT Time Calculation (min) 3 min    Activity Tolerance Patient tolerated treatment well    Behavior During Therapy WFL for tasks assessed/performed             Past Medical History:  Diagnosis Date   Allergy    Asthma    Asthma    Bursitis    right hip   Cancer (HCC)    Breast   Chicken pox    Family history of adverse reaction to anesthesia    Father - slow to wake   FH: colon cancer    mom died age 12 yo   Hay fever    Headache    tension/cluster   Jaundice    as a baby   Motion sickness    moving vehicles   PCOS (polycystic ovarian syndrome)    PONV (postoperative nausea and vomiting)    Wears contact lenses    Past Surgical History:  Procedure Laterality Date   BREAST BIOPSY Right 04/24/2021   BREAST BIOPSY Left 05/25/2021   BREAST RECONSTRUCTION WITH PLACEMENT OF TISSUE EXPANDER AND FLEX HD (ACELLULAR HYDRATED DERMIS) Bilateral 06/29/2021   Procedure: BILATERAL BREAST RECONSTRUCTION WITH PLACEMENT OF TISSUE EXPANDER AND DERMAL MATRIX SUBSTITUTE;  Surgeon: Peggye Form, DO;  Location: [facility];  Service: Plastics;  Laterality: Bilateral;   COLONOSCOPY WITH PROPOFOL N/A 04/10/2019   Procedure: COLONOSCOPY WITH PROPOFOL;  Surgeon: Midge Minium, MD;  Location: [facility];  Service: Endoscopy;  Laterality: N/A;   EYE SURGERY Bilateral 02/16/2004   lasik's   MASTECTOMY W/ SENTINEL NODE BIOPSY Right 06/29/2021   Procedure: RIGHT MASTECTOMY WITH SENTINEL LYMPH NODE BIOPSY;  Surgeon: Griselda Miner, MD;  Location: [facility];  Service: General;  Laterality: Right;    MYRINGOTOMY WITH TUBE PLACEMENT     REMOVAL OF BILATERAL TISSUE EXPANDERS WITH PLACEMENT OF BILATERAL BREAST IMPLANTS Bilateral 09/09/2021   Procedure: REMOVAL OF BILATERAL TISSUE EXPANDERS WITH PLACEMENT OF BILATERAL BREAST IMPLANTS;  Surgeon: Peggye Form, DO;  Location: [facility];  Service: Plastics;  Laterality: Bilateral;  2 hours   TONSILLECTOMY AND ADENOIDECTOMY     TOTAL MASTECTOMY Left 06/29/2021   Procedure: LEFT TOTAL MASTECTOMY;  Surgeon: Griselda Miner, MD;  Location: [facility];  Service: General;  Laterality: Left;   Patient Active Problem List   Diagnosis Date Noted   Encounter to establish care 04/08/2022   Decreased energy 04/08/2022   Healthcare maintenance 04/08/2022   Vitamin D deficiency 04/08/2022   Breast cancer (HCC) 06/29/2021   Easy bruising 05/06/2021   Malignant neoplasm of upper-outer quadrant of right breast in female, estrogen receptor positive (HCC) 05/01/2021   Chondromalacia of right patella 01/28/2021   Family history of colon cancer requiring screening colonoscopy 02/26/2019   PCOS (polycystic ovarian syndrome) 04/27/2015   Intolerance to cold 04/27/2015   Asthma 04/27/2015    REFERRING DIAG: right breast cancer at risk for lymphedema  THERAPY DIAG:Aftercare following surgery for neoplasm  PERTINENT HISTORY: Patient was diagnosed on 03/19/2021 with right grade II  invasive ductal carcinoma breast cancer. She underwent a bilateral mastectomy with a right sentinel node biopsy (4 negative nodes) on 06/29/2021 with expanders placed at that time for reconstruction. It is ER/PR positive and HER2 negative with a Ki67 of 2%.   PRECAUTIONS: right UE Lymphedema risk  SUBJECTIVE: No complaints. Pt returns for her 3 month L-Dex screen.   PAIN:  Are you having pain? No  SOZO SCREENING: Patient was assessed today using the SOZO machine to determine the lymphedema index score. This was compared to her baseline score. It  was determined that she is within the recommended range when compared to her baseline and no further action is needed at this time. She will continue SOZO screenings. These are done every 3 months for 2 years post operatively followed by every 6 months for 2 years, and then annually.   L-DEX FLOWSHEETS - 08/02/22 0800       L-DEX LYMPHEDEMA SCREENING   Measurement Type Unilateral    L-DEX MEASUREMENT EXTREMITY Upper Extremity    POSITION  Standing    DOMINANT SIDE Left    At Risk Side Right    BASELINE SCORE (UNILATERAL) 2    L-DEX SCORE (UNILATERAL) 1.6    VALUE CHANGE (UNILAT) -0.4              Hermenia Bers, PTA 08/02/2022, 8:36 AM

## 2022-08-05 DIAGNOSIS — D2262 Melanocytic nevi of left upper limb, including shoulder: Secondary | ICD-10-CM | POA: Diagnosis not present

## 2022-08-05 DIAGNOSIS — D2272 Melanocytic nevi of left lower limb, including hip: Secondary | ICD-10-CM | POA: Diagnosis not present

## 2022-08-05 DIAGNOSIS — D2261 Melanocytic nevi of right upper limb, including shoulder: Secondary | ICD-10-CM | POA: Diagnosis not present

## 2022-08-05 DIAGNOSIS — D225 Melanocytic nevi of trunk: Secondary | ICD-10-CM | POA: Diagnosis not present

## 2022-08-10 ENCOUNTER — Encounter: Payer: Self-pay | Admitting: Family Medicine

## 2022-08-10 ENCOUNTER — Ambulatory Visit: Payer: BC Managed Care – PPO | Admitting: Family Medicine

## 2022-08-10 VITALS — BP 121/74 | HR 78 | Ht 67.0 in | Wt 157.5 lb

## 2022-08-10 DIAGNOSIS — E559 Vitamin D deficiency, unspecified: Secondary | ICD-10-CM | POA: Diagnosis not present

## 2022-08-10 DIAGNOSIS — Z114 Encounter for screening for human immunodeficiency virus [HIV]: Secondary | ICD-10-CM

## 2022-08-10 DIAGNOSIS — Z13220 Encounter for screening for lipoid disorders: Secondary | ICD-10-CM | POA: Diagnosis not present

## 2022-08-10 DIAGNOSIS — Z1159 Encounter for screening for other viral diseases: Secondary | ICD-10-CM

## 2022-08-10 DIAGNOSIS — Z2821 Immunization not carried out because of patient refusal: Secondary | ICD-10-CM

## 2022-08-10 DIAGNOSIS — Z131 Encounter for screening for diabetes mellitus: Secondary | ICD-10-CM | POA: Diagnosis not present

## 2022-08-10 DIAGNOSIS — Z0000 Encounter for general adult medical examination without abnormal findings: Secondary | ICD-10-CM | POA: Diagnosis not present

## 2022-08-10 NOTE — Assessment & Plan Note (Signed)
Chronic problem  Stable  Ordered vitamin D levels today  She is not currently on supplementation

## 2022-08-10 NOTE — Patient Instructions (Signed)

## 2022-08-10 NOTE — Progress Notes (Unsigned)
I,Jackie Miller,acting as a Neurosurgeon for [employer], MD.,have documented all relevant documentation on the behalf of Jackie Ramp, MD,as directed by  Jackie Ramp, MD while in the presence of Jackie Ramp, MD.   Complete physical exam   Patient: Jackie Miller   DOB: 1980/04/01   41 y.o. Female  MRN: [id number] Visit Date: 08/10/2022  Today's healthcare provider: Ronnald Ramp, MD   No chief complaint on file.  Subjective    Jackie Miller is a 42 y.o. female who presents today for a complete physical exam.    She reports consuming a general diet.  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   She generally feels well. She reports sleeping well. She does not have additional problems to discuss today.   [facility], had recent pap in Jan 2024   HPI    Past Medical History:  Diagnosis Date   Allergy    Asthma    Asthma    Bursitis    right hip   Cancer (HCC)    Breast   Chicken pox    Family history of adverse reaction to anesthesia    Father - slow to wake   FH: colon cancer    mom died age 13 yo   Hay fever    Headache    tension/cluster   Jaundice    as a baby   Motion sickness    moving vehicles   PCOS (polycystic ovarian syndrome)    PONV (postoperative nausea and vomiting)    Wears contact lenses    Past Surgical History:  Procedure Laterality Date   BREAST BIOPSY Right 04/24/2021   BREAST BIOPSY Left 05/25/2021   BREAST RECONSTRUCTION WITH PLACEMENT OF TISSUE EXPANDER AND FLEX HD (ACELLULAR HYDRATED DERMIS) Bilateral 06/29/2021   Procedure: BILATERAL BREAST RECONSTRUCTION WITH PLACEMENT OF TISSUE EXPANDER AND DERMAL MATRIX SUBSTITUTE;  Surgeon: Peggye Form, DO;  Location: [facility];  Service: Plastics;  Laterality: Bilateral;   COLONOSCOPY WITH PROPOFOL N/A 04/10/2019    Procedure: COLONOSCOPY WITH PROPOFOL;  Surgeon: Midge Minium, MD;  Location: [facility];  Service: Endoscopy;  Laterality: N/A;   EYE SURGERY Bilateral 02/16/2004   lasik's   MASTECTOMY W/ SENTINEL NODE BIOPSY Right 06/29/2021   Procedure: RIGHT MASTECTOMY WITH SENTINEL LYMPH NODE BIOPSY;  Surgeon: Griselda Miner, MD;  Location: [facility];  Service: General;  Laterality: Right;   MYRINGOTOMY WITH TUBE PLACEMENT     REMOVAL OF BILATERAL TISSUE EXPANDERS WITH PLACEMENT OF BILATERAL BREAST IMPLANTS Bilateral 09/09/2021   Procedure: REMOVAL OF BILATERAL TISSUE EXPANDERS WITH PLACEMENT OF BILATERAL BREAST IMPLANTS;  Surgeon: Peggye Form, DO;  Location: [facility];  Service: Plastics;  Laterality: Bilateral;  2 hours   TONSILLECTOMY AND ADENOIDECTOMY     TOTAL MASTECTOMY Left 06/29/2021   Procedure: LEFT TOTAL MASTECTOMY;  Surgeon: Griselda Miner, MD;  Location: [facility];  Service: General;  Laterality: Left;   Social History   Socioeconomic History   Marital status: Single    Spouse name: Not on file   Number of children: Not on file   Years of education: Not on file   Highest education level: Not on file  Occupational History   Not on file  Tobacco Use   Smoking status: Never   Smokeless tobacco: Never  Vaping  Use   Vaping Use: Never used  Substance and Sexual Activity   Alcohol use: No    Alcohol/week: 0.0 - 1.0 standard drinks of alcohol   Drug use: No   Sexual activity: Not Currently  Other Topics Concern   Not on file  Social History Narrative   Single w/o kids as of 02/23/19    No pets    From [facility]-    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       DPR dad Alinda Money [phone] & step mom Jasmine December [phone]   Social Determinants of Health   Financial Resource Strain: Not on file  Food Insecurity: Not on file  Transportation Needs: Not on file  Physical Activity: Not on file   Stress: Not on file  Social Connections: Not on file  Intimate Partner Violence: Not on file   Family Status  Relation Name Status   Mother  Deceased   Father  Alive   Brother  Alive       younger   MGM  Deceased   PGM  Deceased   PGF  Deceased   Cousin  Deceased   Family History  Problem Relation Age of Onset   Colon cancer Mother 14   Hypertension Father    Multiple myeloma Father    Breast cancer Maternal Grandmother    Heart attack Paternal Grandmother 74   Lung cancer Paternal Grandfather    Breast cancer Cousin    Allergies  Allergen Reactions   Bacitracin Itching and Rash   Amoxicillin Swelling   Doxycycline Other (See Comments)    Muscloskeletal pain    Patient Care Team: Jackie Ramp, MD as PCP - General (Family Medicine) Griselda Miner, MD as Consulting Physician (General Surgery) Serena Croissant, MD as Consulting Physician (Hematology and Oncology) Antony Blackbird, MD as Consulting Physician (Radiation Oncology) Dillingham, Alena Bills, DO as Attending Physician (Plastic Surgery)   Medications: Outpatient Medications Prior to Visit  Medication Sig   cetirizine (ZYRTEC) 10 MG chewable tablet Chew 10 mg by mouth daily.   Cholecalciferol (VITAMIN D) 50 MCG (2000 UT) tablet Take 2,000 Units by mouth daily.   montelukast (SINGULAIR) 10 MG tablet Take 10 mg by mouth at bedtime.   PROAIR HFA 108 (90 Base) MCG/ACT inhaler Inhale 2 puffs into the lungs every 6 (six) hours as needed.   tamoxifen (NOLVADEX) 20 MG tablet Take 1 tablet (20 mg total) by mouth daily.   No facility-administered medications prior to visit.    Review of Systems  {Labs  Heme  Chem  Endocrine  Serology  Results Review (optional):23779}  Objective    There were no vitals taken for this visit. {Show previous vital signs (optional):23777}   Physical Exam Vitals reviewed.  Constitutional:      General: She is not in acute distress.    Appearance: Normal appearance. She  is not ill-appearing, toxic-appearing or diaphoretic.  HENT:     Head: Normocephalic and atraumatic.     Right Ear: Tympanic membrane and external ear normal. There is no impacted cerumen.     Left Ear: Tympanic membrane and external ear normal. There is no impacted cerumen.     Nose: Nose normal.     Mouth/Throat:     Pharynx: Oropharynx is clear.  Eyes:     General: No scleral icterus.    Extraocular Movements: Extraocular movements intact.     Conjunctiva/sclera: Conjunctivae normal.  Pupils: Pupils are equal, round, and reactive to light.  Cardiovascular:     Rate and Rhythm: Normal rate and regular rhythm.     Pulses: Normal pulses.     Heart sounds: Normal heart sounds. No murmur heard.    No friction rub. No gallop.  Pulmonary:     Effort: Pulmonary effort is normal. No respiratory distress.     Breath sounds: Normal breath sounds. No wheezing, rhonchi or rales.  Abdominal:     General: Bowel sounds are normal. There is no distension.     Palpations: Abdomen is soft. There is no mass.     Tenderness: There is no abdominal tenderness. There is no guarding.  Musculoskeletal:        General: No deformity.     Cervical back: Normal range of motion and neck supple. No rigidity.     Right lower leg: No edema.     Left lower leg: No edema.  Lymphadenopathy:     Cervical: No cervical adenopathy.  Skin:    General: Skin is warm.     Capillary Refill: Capillary refill takes less than 2 seconds.     Findings: No erythema or rash.  Neurological:     General: No focal deficit present.     Mental Status: She is alert and oriented to person, place, and time.     Motor: No weakness.     Gait: Gait normal.  Psychiatric:        Mood and Affect: Mood normal.        Behavior: Behavior normal.       Last depression screening scores    04/07/2022    2:15 PM 07/02/2020    7:47 AM 10/04/2019    3:45 PM  PHQ 2/9 Scores  PHQ - 2 Score 0 0 0  PHQ- 9 Score 2     Last fall risk  screening    04/07/2022    2:15 PM  Fall Risk   Falls in the past year? 0  Number falls in past yr: 0  Injury with Fall? 0  Risk for fall due to : No Fall Risks   Last Audit-C alcohol use screening    04/07/2022    2:16 PM  Alcohol Use Disorder Test (AUDIT)  1. How often do you have a drink containing alcohol? 0  2. How many drinks containing alcohol do you have on a typical day when you are drinking? 0  3. How often do you have six or more drinks on one occasion? 0  AUDIT-C Score 0   A score of 3 or more in women, and 4 or more in men indicates increased risk for alcohol abuse, EXCEPT if all of the points are from question 1   No results found for any visits on 08/10/22.  Assessment & Plan    Routine Health Maintenance and Physical Exam     Immunization History  Administered Date(s) Administered   Influenza,inj,Quad PF,6+ Mos 04/18/2015   Tdap 04/18/2015    Health Maintenance  Topic Date Due   COVID-19 Vaccine (1) Never done   HIV Screening  Never done   Hepatitis C Screening  Never done   PAP SMEAR-Modifier  11/19/2019   INFLUENZA VACCINE  09/16/2022   Colonoscopy  04/09/2024   DTaP/Tdap/Td (2 - Td or Tdap) 04/17/2025   HPV VACCINES  Aged Out    Problem List Items Addressed This Visit       Other   Vitamin D  deficiency    Chronic problem  Stable  Ordered vitamin D levels today  She is not currently on supplementation         Relevant Orders   Vitamin D (25 hydroxy)   CBC with Differential/Platelet   COVID-19 vaccination declined   Other Visit Diagnoses     Annual physical exam    -  Primary   Screening for HIV (human immunodeficiency virus)       Relevant Orders   HIV antibody (with reflex)   Encounter for hepatitis C screening test for low risk patient       Relevant Orders   Hepatitis C Antibody   Screening for diabetes mellitus       Relevant Orders   Comprehensive metabolic panel   Hemoglobin A1c   Screening for lipid disorders        Relevant Orders   Lipid Profile        No follow-ups on file.       The entirety of the information documented in the History of Present Illness, Review of Systems and Physical Exam were personally obtained by me. Portions of this information were initially documented by Acey Lav. I, Jackie Ramp, MD have reviewed the documentation above for thoroughness and accuracy.      Jackie Ramp, MD  [facility] [phone] (phone) [phone] (fax)  [facility]

## 2022-08-11 LAB — CBC WITH DIFFERENTIAL/PLATELET
Basophils Absolute: 0.1 10*3/uL (ref 0.0–0.2)
Basos: 1 %
EOS (ABSOLUTE): 0.1 10*3/uL (ref 0.0–0.4)
Eos: 1 %
Hematocrit: 39.5 % (ref 34.0–46.6)
Hemoglobin: 12.8 g/dL (ref 11.1–15.9)
Immature Grans (Abs): 0 10*3/uL (ref 0.0–0.1)
Immature Granulocytes: 0 %
Lymphocytes Absolute: 2 10*3/uL (ref 0.7–3.1)
Lymphs: 27 %
MCH: 28.4 pg (ref 26.6–33.0)
MCHC: 32.4 g/dL (ref 31.5–35.7)
MCV: 88 fL (ref 79–97)
Monocytes Absolute: 0.6 10*3/uL (ref 0.1–0.9)
Monocytes: 8 %
Neutrophils Absolute: 4.7 10*3/uL (ref 1.4–7.0)
Neutrophils: 63 %
Platelets: 253 10*3/uL (ref 150–450)
RBC: 4.5 x10E6/uL (ref 3.77–5.28)
RDW: 11.8 % (ref 11.7–15.4)
WBC: 7.5 10*3/uL (ref 3.4–10.8)

## 2022-08-11 LAB — COMPREHENSIVE METABOLIC PANEL
ALT: 11 IU/L (ref 0–32)
AST: 12 IU/L (ref 0–40)
Albumin: 4.4 g/dL (ref 3.9–4.9)
Alkaline Phosphatase: 46 IU/L (ref 44–121)
BUN/Creatinine Ratio: 12 (ref 9–23)
BUN: 9 mg/dL (ref 6–24)
Bilirubin Total: 0.3 mg/dL (ref 0.0–1.2)
CO2: 23 mmol/L (ref 20–29)
Calcium: 9.4 mg/dL (ref 8.7–10.2)
Chloride: 105 mmol/L (ref 96–106)
Creatinine, Ser: 0.78 mg/dL (ref 0.57–1.00)
Globulin, Total: 1.8 g/dL (ref 1.5–4.5)
Glucose: 96 mg/dL (ref 70–99)
Potassium: 4.3 mmol/L (ref 3.5–5.2)
Sodium: 142 mmol/L (ref 134–144)
Total Protein: 6.2 g/dL (ref 6.0–8.5)
eGFR: 98 mL/min/{1.73_m2} (ref >59)

## 2022-08-11 LAB — HEMOGLOBIN A1C
Est. average glucose Bld gHb Est-mCnc: 97 mg/dL
Hgb A1c MFr Bld: 5 % (ref 4.8–5.6)

## 2022-08-11 LAB — HEPATITIS C ANTIBODY: Hep C Virus Ab: NONREACTIVE

## 2022-08-11 LAB — LIPID PANEL
Chol/HDL Ratio: 2.1 ratio (ref 0.0–4.4)
Cholesterol, Total: 138 mg/dL (ref 100–199)
HDL: 65 mg/dL (ref >39)
LDL Chol Calc (NIH): 59 mg/dL (ref 0–99)
Triglycerides: 67 mg/dL (ref 0–149)
VLDL Cholesterol Cal: 14 mg/dL (ref 5–40)

## 2022-08-11 LAB — HIV ANTIBODY (ROUTINE TESTING W REFLEX): HIV Screen 4th Generation wRfx: NONREACTIVE

## 2022-08-11 LAB — VITAMIN D 25 HYDROXY (VIT D DEFICIENCY, FRACTURES): Vit D, 25-Hydroxy: 39 ng/mL (ref 30.0–100.0)

## 2022-08-12 NOTE — Assessment & Plan Note (Signed)
Chronic conditions are stable  Patient was counseled on benefits of regular physical activity with goal of 150 minutes of moderate to vigurous intensity 4 days per week  Patient was counseled to consume well balanced diet of fruits, vegetables, limited saturated fats and limited sugary foods and beverages with emphasis on consuming 6-8 glasses of water daily  Screening recommended today: A1c, lipid panel, HIV, hepatitis C; also ordered CBC and CMP Vaccines recommended today: Discussed COVID-vaccine, patient declines Patient is UTD on age appropriate screenings

## 2022-08-12 NOTE — Assessment & Plan Note (Signed)
Patient states that she does not want to receive COVID vaccinations

## 2022-08-31 ENCOUNTER — Encounter: Payer: Self-pay | Admitting: Physician Assistant

## 2022-08-31 ENCOUNTER — Ambulatory Visit: Payer: BC Managed Care – PPO | Admitting: Physician Assistant

## 2022-08-31 VITALS — BP 111/73 | HR 74

## 2022-08-31 DIAGNOSIS — Z853 Personal history of malignant neoplasm of breast: Secondary | ICD-10-CM

## 2022-08-31 DIAGNOSIS — Z9013 Acquired absence of bilateral breasts and nipples: Secondary | ICD-10-CM | POA: Diagnosis not present

## 2022-08-31 NOTE — Progress Notes (Signed)
NIPPLE AREOLAR TATTOO PROCEDURE   PREOPERATIVE DIAGNOSIS:  Acquired absence of bilateral nipple areolar    POSTOPERATIVE DIAGNOSIS: Acquired absence of bilateral nipple areolar     PROCEDURES: Bilateral nipple areolar tattoo    ANESTHESIA:  EMLA, 4% Lidocaine with epinephrine   COMPLICATIONS: None.   JUSTIFICATION FOR PROCEDURE:  Jackie Miller is a 42 y.o. female with a history of breast cancer status post breast reconstruction with submuscular implants. The patient presents for nipple areolar complex tattoo. Risks, benefits, indications, and alternatives of the above described procedures were discussed with the patient and all the patient's questions were answered.    01/13/22 Patient's areolas were pink prior to her double mastectomy. She understands that our tattoo ink will require combination of peach and tans to achieve the outcome that she would prefer.  Areola appears slightly more pale in comparison the nipple. Thin black shading circumferentially around nipple, predominantly inferiorly, is seen in her preoperative photos.  Will try to mimic the projection.   After mixing the various ink color combinations, patient feels as though the light tan/peach areola will appear more natural.  We also discussed a darker nipple versus lighter nipple, decided to go with a selection that was a bit brighter and lighter to help with contrast, particularly after we do the surrounding nipple shading at subsequent encounter.   02/24/22 Patient tells that she is pleased with the shape, location, and color.  However, there was considerable fading since her initial tattoo.  She is hoping for repeat areolar tattooing in addition to nipple fill and shading.  No other changes in interim health.   04/27/22 Patient is hoping for lightening of the nipple to create improved contrast with the remainder of her areola.  She is otherwise quite pleased with the color of the areola as well as size/shape.  She simply wants  more contrast.  Discussed lightened pigment for nipple as well as even lighter pigment for halo effect and detailing within the nipple.   05/25/2022 Called patient and she stated that much of the lightening/highlights have faded and was still hoping for improved contrast between the nipple and areola.   06/29/2022 Today, she tells me that she still has not had much improvement with regard to the contrast between nipples and areolas.  While she had previously suggested that she wanted her nipples to be slightly lighter in appearance, she now feels as though perhaps that should be darker compared to the areola.  Since last encounter, clinic received a new line of tattoo ink that is catered specifically to Jackie Miller patients.  Discussed this with the patient and she would like to try the new colors.  After mixing couple options, can do decision.  Discussed that it may.  The slightly louder in appearance compared to the previous ink selection, but this is something that she is understanding and agreeable to.  She understands that I cannot tell her for sure how much will fade or if some of the peach ink will show through.  She still would like to proceed.  08/31/2022 Patient is pleased with the new ink selection.  They are more consistent with what she would have anticipated given her skin type.  Improved contrast.  However, the right areola appeared to be a bit faded or light in comparison to the slightly darker left areola which she prefers.  She also believes that the nipples needed additional pass.  Otherwise she is pleased.   DESCRIPTION OF PROCEDURE: After written informed consent  was obtained and proper identification of patient and surgical site was made, the patient was taken to the procedure room and placed supine on the operating room table. A time out was performed to confirm patient's identity and surgical site. The patient was prepped and draped in the usual sterile fashion. Alcohol swabs were  used. Attention was turned to the selection of flesh colored permanent tattoo ink to produce the appropriate color for nipple areolar complex tattooing.  Nipple position was then chosen with patient.  Using a Digital-Pop Deluxe machine by [employer], 7R needle was placed and pigment was applied to both the nipple bilaterally as well as the right areola.  Did not perform any shading or lightening during today's tattoo encounter.  Once adequate pigment had been applied to the nipple areolar complex, Xeroform dressing was applied followed by 4 x 4 gauze and secured with Medipore tape. There were no complications and the procedure was tolerated without difficulty.     Areola-color: EA Dortha Schwalbe Areola 1 (8) mixed with EA Snow White (4) in 2:1 ratio.  Nipple color: EA Fair Maiden Nipple (4) mixed with EA Snow White (4) in 1:1 ratio.  Shading: Cool Peach (1), Cool Mink (1), and Cool Honey (1) in 1:1:1 ratio.   Lightening/highlights: Fair Peach (1) and Portrait White (4) in 1:4 ratio. Colors EXP: 04/2024   Nipples are each approximately 3.5 cm diameter at conclusion of visit.  She was quite pleased at the end of the visit and states that overall she is quite happy with the outcome of her tattooing.  She believes that this was likely the final visit that she would need. Will arrange for telephone encounter in 4 weeks to discuss results and decide on what she would like to do next, if anything.

## 2022-09-17 DIAGNOSIS — C50411 Malignant neoplasm of upper-outer quadrant of right female breast: Secondary | ICD-10-CM | POA: Diagnosis not present

## 2022-09-17 DIAGNOSIS — Z170 Estrogen receptor positive status [ER+]: Secondary | ICD-10-CM | POA: Diagnosis not present

## 2022-09-28 ENCOUNTER — Ambulatory Visit (INDEPENDENT_AMBULATORY_CARE_PROVIDER_SITE_OTHER): Payer: BC Managed Care – PPO | Admitting: Physician Assistant

## 2022-09-28 DIAGNOSIS — Z9013 Acquired absence of bilateral breasts and nipples: Secondary | ICD-10-CM

## 2022-09-28 NOTE — Progress Notes (Signed)
Patient is a pleasant 42 year old female with PMH of right-sided breast cancer s/p bilateral mastectomy and reconstruction who has been coming to clinic for nipple areolar tattoo restoration who joins via telephone for follow-up.   She was last treated here in clinic on 08/31/2022.  At that time, she was pleased with the new ink selection.  For what ever reason, the right areola appeared a bit faded/lightened compared to the left areola.  She also wanted the nipples to have an additional pass.  Plan was to follow-up in 4 weeks to discuss whether or not further intervention will be needed.  The patient was at work and this provider was calling from their office.  A total of 9 minutes was spent speaking with the patient and reviewing chart.    Today, patient reports that she is doing well.  She is quite pleased with the outcome of her tattoo restoration and states that the right areola now completely matches the left areola.  She states there is no longer discrepancy in terms of lightness/fading.  Patient remains quite pleased with the new color selection.  It has been 1 year since her implant exchange with Dr. Ulice Bold.  She feels as though the implants might be a bit low on her chest, but at this time she is not wanting to move forward with any additional surgical intervention.  Fat grafting is also something that she would not be interested in moving forward.    Informed patient that if there is any concerns that she has, she can certainly call the clinic to schedule an appointment with Dr. Ulice Bold and she would be happy to discuss any options available.  She understands and states that she is pleased with the care she has received from the team here.  No additional tattoo follow-up needed.

## 2022-11-01 ENCOUNTER — Encounter: Payer: Self-pay | Admitting: Hematology and Oncology

## 2022-11-08 ENCOUNTER — Ambulatory Visit: Payer: BC Managed Care – PPO | Attending: General Surgery

## 2022-11-08 VITALS — Wt 159.5 lb

## 2022-11-08 DIAGNOSIS — Z483 Aftercare following surgery for neoplasm: Secondary | ICD-10-CM | POA: Insufficient documentation

## 2022-11-08 NOTE — Therapy (Signed)
OUTPATIENT PHYSICAL THERAPY SOZO SCREENING NOTE   Patient Name: Jackie Miller MRN: [id number] DOB:01/04/1981, 42 y.o., female Today's Date: 11/08/2022  PCP: Ronnald Ramp, MD REFERRING PROVIDER: Griselda Miner, MD   PT End of Session - 11/08/22 [phone]     Visit Number 3   # unchanged due to screen only   PT Start Time 0836    PT Stop Time 0840    PT Time Calculation (min) 4 min    Activity Tolerance Patient tolerated treatment well    Behavior During Therapy WFL for tasks assessed/performed             Past Medical History:  Diagnosis Date   Allergy    Asthma    Asthma    Bursitis    right hip   Cancer (HCC)    Breast   Chicken pox    Family history of adverse reaction to anesthesia    Father - slow to wake   FH: colon cancer    mom died age 27 yo   Hay fever    Headache    tension/cluster   Jaundice    as a baby   Motion sickness    moving vehicles   PCOS (polycystic ovarian syndrome)    PONV (postoperative nausea and vomiting)    Wears contact lenses    Past Surgical History:  Procedure Laterality Date   BREAST BIOPSY Right 04/24/2021   BREAST BIOPSY Left 05/25/2021   BREAST RECONSTRUCTION WITH PLACEMENT OF TISSUE EXPANDER AND FLEX HD (ACELLULAR HYDRATED DERMIS) Bilateral 06/29/2021   Procedure: BILATERAL BREAST RECONSTRUCTION WITH PLACEMENT OF TISSUE EXPANDER AND DERMAL MATRIX SUBSTITUTE;  Surgeon: Peggye Form, DO;  Location: [facility];  Service: Plastics;  Laterality: Bilateral;   COLONOSCOPY WITH PROPOFOL N/A 04/10/2019   Procedure: COLONOSCOPY WITH PROPOFOL;  Surgeon: Midge Minium, MD;  Location: [facility];  Service: Endoscopy;  Laterality: N/A;   EYE SURGERY Bilateral 02/16/2004   lasik's   MASTECTOMY W/ SENTINEL NODE BIOPSY Right 06/29/2021   Procedure: RIGHT MASTECTOMY WITH SENTINEL LYMPH NODE BIOPSY;  Surgeon: Griselda Miner, MD;  Location: [facility];  Service: General;  Laterality: Right;    MYRINGOTOMY WITH TUBE PLACEMENT     REMOVAL OF BILATERAL TISSUE EXPANDERS WITH PLACEMENT OF BILATERAL BREAST IMPLANTS Bilateral 09/09/2021   Procedure: REMOVAL OF BILATERAL TISSUE EXPANDERS WITH PLACEMENT OF BILATERAL BREAST IMPLANTS;  Surgeon: Peggye Form, DO;  Location: [facility];  Service: Plastics;  Laterality: Bilateral;  2 hours   TONSILLECTOMY AND ADENOIDECTOMY     TOTAL MASTECTOMY Left 06/29/2021   Procedure: LEFT TOTAL MASTECTOMY;  Surgeon: Griselda Miner, MD;  Location:  SURGERY CENTER;  Service: General;  Laterality: Left;   Patient Active Problem List   Diagnosis Date Noted   COVID-19 vaccination declined 08/10/2022   Decreased energy 04/08/2022   Healthcare maintenance 04/08/2022   Vitamin D deficiency 04/08/2022   Breast cancer (HCC) 06/29/2021   Easy bruising 05/06/2021   Malignant neoplasm of upper-outer quadrant of right breast in female, estrogen receptor positive (HCC) 05/01/2021   Chondromalacia of right patella 01/28/2021   Annual physical exam 10/08/2019   Family history of colon cancer requiring screening colonoscopy 02/26/2019   PCOS (polycystic ovarian syndrome) 04/27/2015   Intolerance to cold 04/27/2015   Asthma 04/27/2015    REFERRING DIAG: right breast cancer at risk for lymphedema  THERAPY DIAG:Aftercare following surgery for neoplasm  PERTINENT HISTORY: Patient was diagnosed on  03/19/2021 with right grade II invasive ductal carcinoma breast cancer. She underwent a bilateral mastectomy with a right sentinel node biopsy (4 negative nodes) on 06/29/2021 with expanders placed at that time for reconstruction. It is ER/PR positive and HER2 negative with a Ki67 of 2%.   PRECAUTIONS: right UE Lymphedema risk  SUBJECTIVE: Pt returns for her 3 month L-Dex screen.   PAIN:  Are you having pain? No  SOZO SCREENING: Patient was assessed today using the SOZO machine to determine the lymphedema index score. This was compared to her  baseline score. It was determined that she is within the recommended range when compared to her baseline and no further action is needed at this time. She will continue SOZO screenings. These are done every 3 months for 2 years post operatively followed by every 6 months for 2 years, and then annually.   L-DEX FLOWSHEETS - 11/08/22 0800       L-DEX LYMPHEDEMA SCREENING   Measurement Type Unilateral    L-DEX MEASUREMENT EXTREMITY Upper Extremity    POSITION  Standing    DOMINANT SIDE Left    At Risk Side Right    BASELINE SCORE (UNILATERAL) 2    L-DEX SCORE (UNILATERAL) -0.9    VALUE CHANGE (UNILAT) -2.9              Hermenia Bers, PTA 11/08/2022, 8:39 AM

## 2023-01-26 DIAGNOSIS — S93492A Sprain of other ligament of left ankle, initial encounter: Secondary | ICD-10-CM | POA: Diagnosis not present

## 2023-01-26 DIAGNOSIS — S93491A Sprain of other ligament of right ankle, initial encounter: Secondary | ICD-10-CM | POA: Diagnosis not present

## 2023-02-07 ENCOUNTER — Ambulatory Visit: Payer: BC Managed Care – PPO | Attending: General Surgery

## 2023-02-07 VITALS — Wt 157.1 lb

## 2023-02-07 DIAGNOSIS — Z483 Aftercare following surgery for neoplasm: Secondary | ICD-10-CM | POA: Insufficient documentation

## 2023-02-07 NOTE — Therapy (Signed)
OUTPATIENT PHYSICAL THERAPY SOZO SCREENING NOTE   Patient Name: Jackie Miller MRN: [id number] DOB:01-17-1981, 42 y.o., female Today's Date: 02/07/2023  PCP: Ronnald Ramp, MD REFERRING PROVIDER: Griselda Miner, MD   PT End of Session - 02/07/23 1045     Visit Number 3   # unchanged due to screen only   PT Start Time 1043    PT Stop Time 1047    PT Time Calculation (min) 4 min    Activity Tolerance Patient tolerated treatment well    Behavior During Therapy WFL for tasks assessed/performed             Past Medical History:  Diagnosis Date   Allergy    Asthma    Asthma    Bursitis    right hip   Cancer (HCC)    Breast   Chicken pox    Family history of adverse reaction to anesthesia    Father - slow to wake   FH: colon cancer    mom died age 23 yo   Hay fever    Headache    tension/cluster   Jaundice    as a baby   Motion sickness    moving vehicles   PCOS (polycystic ovarian syndrome)    PONV (postoperative nausea and vomiting)    Wears contact lenses    Past Surgical History:  Procedure Laterality Date   BREAST BIOPSY Right 04/24/2021   BREAST BIOPSY Left 05/25/2021   BREAST RECONSTRUCTION WITH PLACEMENT OF TISSUE EXPANDER AND FLEX HD (ACELLULAR HYDRATED DERMIS) Bilateral 06/29/2021   Procedure: BILATERAL BREAST RECONSTRUCTION WITH PLACEMENT OF TISSUE EXPANDER AND DERMAL MATRIX SUBSTITUTE;  Surgeon: Peggye Form, DO;  Location: [facility];  Service: Plastics;  Laterality: Bilateral;   COLONOSCOPY WITH PROPOFOL N/A 04/10/2019   Procedure: COLONOSCOPY WITH PROPOFOL;  Surgeon: Midge Minium, MD;  Location: [facility];  Service: Endoscopy;  Laterality: N/A;   EYE SURGERY Bilateral 02/16/2004   lasik's   MASTECTOMY W/ SENTINEL NODE BIOPSY Right 06/29/2021   Procedure: RIGHT MASTECTOMY WITH SENTINEL LYMPH NODE BIOPSY;  Surgeon: Griselda Miner, MD;  Location: [facility];  Service: General;  Laterality:  Right;   MYRINGOTOMY WITH TUBE PLACEMENT     REMOVAL OF BILATERAL TISSUE EXPANDERS WITH PLACEMENT OF BILATERAL BREAST IMPLANTS Bilateral 09/09/2021   Procedure: REMOVAL OF BILATERAL TISSUE EXPANDERS WITH PLACEMENT OF BILATERAL BREAST IMPLANTS;  Surgeon: Peggye Form, DO;  Location: [facility];  Service: Plastics;  Laterality: Bilateral;  2 hours   TONSILLECTOMY AND ADENOIDECTOMY     TOTAL MASTECTOMY Left 06/29/2021   Procedure: LEFT TOTAL MASTECTOMY;  Surgeon: Griselda Miner, MD;  Location: [facility];  Service: General;  Laterality: Left;   Patient Active Problem List   Diagnosis Date Noted   COVID-19 vaccination declined 08/10/2022   Decreased energy 04/08/2022   Healthcare maintenance 04/08/2022   Vitamin D deficiency 04/08/2022   Breast cancer (HCC) 06/29/2021   Easy bruising 05/06/2021   Malignant neoplasm of upper-outer quadrant of right breast in female, estrogen receptor positive (HCC) 05/01/2021   Chondromalacia of right patella 01/28/2021   Annual physical exam 10/08/2019   Family history of colon cancer requiring screening colonoscopy 02/26/2019   PCOS (polycystic ovarian syndrome) 04/27/2015   Intolerance to cold 04/27/2015   Asthma 04/27/2015    REFERRING DIAG: right breast cancer at risk for lymphedema  THERAPY DIAG:Aftercare following surgery for neoplasm  PERTINENT HISTORY: Patient was diagnosed on  03/19/2021 with right grade II invasive ductal carcinoma breast cancer. She underwent a bilateral mastectomy with a right sentinel node biopsy (4 negative nodes) on 06/29/2021 with expanders placed at that time for reconstruction. It is ER/PR positive and HER2 negative with a Ki67 of 2%.   PRECAUTIONS: right UE Lymphedema risk  SUBJECTIVE: Pt returns for her 3 month L-Dex screen.   PAIN:  Are you having pain? No  SOZO SCREENING: Patient was assessed today using the SOZO machine to determine the lymphedema index score. This was compared  to her baseline score. It was determined that she is within the recommended range when compared to her baseline and no further action is needed at this time. She will continue SOZO screenings. These are done every 3 months for 2 years post operatively followed by every 6 months for 2 years, and then annually.   L-DEX FLOWSHEETS - 02/07/23 1000       L-DEX LYMPHEDEMA SCREENING   Measurement Type Unilateral    L-DEX MEASUREMENT EXTREMITY Upper Extremity    POSITION  Standing    DOMINANT SIDE Left    At Risk Side Right    BASELINE SCORE (UNILATERAL) 2    L-DEX SCORE (UNILATERAL) 0.8    VALUE CHANGE (UNILAT) -1.2              Hermenia Bers, PTA 02/07/2023, 10:46 AM

## 2023-04-19 ENCOUNTER — Inpatient Hospital Stay: Payer: 59 | Attending: Hematology and Oncology | Admitting: Hematology and Oncology

## 2023-04-19 VITALS — BP 115/62 | HR 70 | Temp 98.5°F | Resp 18 | Wt 160.4 lb

## 2023-04-19 DIAGNOSIS — C50411 Malignant neoplasm of upper-outer quadrant of right female breast: Secondary | ICD-10-CM | POA: Diagnosis not present

## 2023-04-19 DIAGNOSIS — Z79810 Long term (current) use of selective estrogen receptor modulators (SERMs): Secondary | ICD-10-CM | POA: Insufficient documentation

## 2023-04-19 DIAGNOSIS — Z170 Estrogen receptor positive status [ER+]: Secondary | ICD-10-CM | POA: Diagnosis not present

## 2023-04-19 MED ORDER — LORAZEPAM 1 MG PO TABS: 1.0000 mg | ORAL_TABLET | Freq: Three times a day (TID) | ORAL | 0 refills | Status: AC

## 2023-04-19 MED ORDER — TAMOXIFEN CITRATE 20 MG PO TABS: 20.0000 mg | ORAL_TABLET | Freq: Every day | ORAL | 3 refills | Status: AC

## 2023-04-19 NOTE — Progress Notes (Signed)
 Patient Care Team: Ronnald Ramp, MD as PCP - General (Family Medicine) Griselda Miner, MD as Consulting Physician (General Surgery) Serena Croissant, MD as Consulting Physician (Hematology and Oncology) Antony Blackbird, MD as Consulting Physician (Radiation Oncology) Dillingham, Alena Bills, DO as Attending Physician (Plastic Surgery) [facility]  DIAGNOSIS:  Encounter Diagnosis  Name Primary?   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 BREAST, LEFT, MASTECTOMY:  - Fibrocystic changes with rare calcifications  - Small incidental fibroadenoma, 0.6 cm  - Biopsy site changes  - Negative for carcinoma   B. BREAST, RIGHT, MASTECTOMY:  - Invasive ductal carcinoma, 2.0 cm, grade 1 and ductal carcinoma in situ (ribbon shaped clip)  - Separate focus of patchy ductal carcinoma in situ, intermediate grade, involving an area of fibrocystic change spanning about 7 cm (X shaped clip)  - Invasive carcinoma is 0.2 cm from anterior margin  - Biopsy site changes  - See oncology table   C. LYMPH NODE, RIGHT AXILLARY #1, SENTINEL,  EXCISION:  - Lymph node, negative for carcinoma (0/1)   D. LYMPH NODE, RIGHT AXILLARY #2, SENTINEL, EXCISION:  - Lymph node, negative for carcinoma (0/1)   E. LYMPH NODE, RIGHT AXILLARY, SENTINEL, EXCISION:  - Lymph node, negative for carcinoma (0/1)   F. LYMPH NODE, RIGHT AXILLARY, SENTINEL, EXCISION:  - Lymph node, negative for carcinoma (0/1)      CHIEF COMPLIANT: Follow-up on tamoxifen therapy  HISTORY OF PRESENT ILLNESS:   History of Present Illness The patient, with a history of bilateral mastectomy and currently on tamoxifen, presents for a routine follow-up. She reports tolerating tamoxifen well with rare hot flashes and some mood swings. She denies any breast pain or discomfort. She has not had a mammogram since her diagnosis and bilateral mastectomy. She is also on omeprazole and engages in regular exercise, teaching acrobatics.  The patient also reports light and occasionally missed periods, which she attributes to the tamoxifen. She expresses no concern about this change in her menstrual cycle.     ALLERGIES:  is allergic to bacitracin, amoxicillin, and doxycycline.  MEDICATIONS:  Current Outpatient Medications  Medication Sig Dispense Refill   cetirizine (ZYRTEC) 10 MG chewable tablet Chew 10 mg by mouth daily.     Cholecalciferol (VITAMIN D) 50 MCG (2000 UT) tablet Take 2,000 Units by mouth daily.     montelukast (SINGULAIR) 10 MG tablet Take 10 mg by mouth at bedtime.     PROAIR HFA 108 (90 Base) MCG/ACT inhaler Inhale 2 puffs into the lungs every 6 (six) hours as needed. 18 g 1   LORazepam (ATIVAN) 1 MG tablet Take 1 tablet (1 mg total)  by mouth every 8 (eight) hours. 5 tablet 0   tamoxifen (NOLVADEX) 20 MG tablet Take 1 tablet (20 mg total) by mouth daily. 90 tablet 3   No current facility-administered medications for this visit.    PHYSICAL EXAMINATION: ECOG PERFORMANCE STATUS: 1 - Symptomatic but completely ambulatory  Vitals:   04/19/23 0800  BP:  115/62  Pulse: 70  Resp: 18  Temp: 98.5 F (36.9 C)  SpO2: 98%   Filed Weights   04/19/23 0800  Weight: 160 lb 6.4 oz (72.8 kg)    Physical Exam Bilateral mastectomies with reconstruction.  No palpable lumps or nodules in bilateral breasts or axilla  (exam performed in the presence of a chaperone)  LABORATORY DATA:  I have reviewed the data as listed    Latest Ref Rng & Units 08/10/2022    4:09 PM 05/06/2021   12:24 PM 10/04/2019    4:23 PM  CMP  Glucose 70 - 99 mg/dL 96  91  90   BUN 6 - 24 mg/dL 9  11  12    Creatinine 0.57 - 1.00 mg/dL 0.98  1.19  1.47   Sodium 134 - 144 mmol/L 142  140  140   Potassium 3.5 - 5.2 mmol/L 4.3  4.4  4.3   Chloride 96 - 106 mmol/L 105  107  107   CO2 20 - 29 mmol/L 23  26  26    Calcium 8.7 - 10.2 mg/dL 9.4  9.7  9.4   Total Protein 6.0 - 8.5 g/dL 6.2  6.7  6.6   Total Bilirubin 0.0 - 1.2 mg/dL 0.3  0.8  0.4   Alkaline Phos 44 - 121 IU/L 46  43  40   AST 0 - 40 IU/L 12  10  12    ALT 0 - 32 IU/L 11  7  8      Lab Results  Component Value Date   WBC 7.5 08/10/2022   HGB 12.8 08/10/2022   HCT 39.5 08/10/2022   MCV 88 08/10/2022   PLT 253 08/10/2022   NEUTROABS 4.7 08/10/2022    ASSESSMENT & PLAN:  Malignant neoplasm of upper-outer quadrant of right breast in female, estrogen receptor positive (HCC) 04/24/2021:Screening mammogram detected right breast calcifications and distortion, ultrasound UOQ 10:00: 1.6 cm biopsy grade 2 IDC ER 70%, PR greater than 95%, HER2 negative, Ki-67 2%, calcs spanned 7 cm: Biopsy intermediate grade DCIS ER 95%, PR 95% Oncotype DX score 20: 6% risk of recurrence   06/29/2021: Bilateral mastectomies: Left mastectomy: Fibrocystic changes and fibroadenoma 0.6 cm; right mastectomy: 2 cm grade 1 IDC with DCIS, separate focus of patchy DCIS intermediate grade, 0/4 lymph nodes negative ER 70%, PR greater than 95%, HER2 negative, Ki-67 2%   Treatment plan: adjuvant antiestrogen therapy (we discussed ovarian suppression  with AI versus tamoxifen).  Family decided on tamoxifen instead.   Tamoxifen toxicities: Very mild hot flashes but otherwise tolerating it extremely well.  Lightening of menstrual cycles.  Occasional mood swings   Breast cancer surveillance: Breast exam 04/19/2023: Benign No role of imaging since she had bilateral mastectomies We plan to do breast MRIs every 3 years.  We will set her up for an MRI in 1 month (she requests Ativan for anxiety: I sent the prescription today)   Return to clinic in 1 year for follow-up      Orders Placed This Encounter  Procedures   MR BREAST BILATERAL W WO CONTRAST INC CAD    Standing Status:  Future    Expected Date:   05/20/2023    Expiration Date:   04/18/2024    If indicated for the ordered procedure, I authorize the administration of contrast media per Radiology protocol:   Yes    What is the patient's sedation requirement?:   Anti-anxiety    Does the patient have a pacemaker or implanted devices?:   No    Preferred imaging location?:   [facility] (table limit-550lbs)    Release to patient:   Immediate   The patient has a good understanding of the overall plan. she agrees with it. she will call with any problems that may develop before the next visit here. Total time spent: 30 mins including face to face time and time spent for planning, charting and co-ordination of care   Tamsen Meek, MD 04/19/23

## 2023-04-19 NOTE — Assessment & Plan Note (Signed)
 04/24/2021:Screening mammogram detected right breast calcifications and distortion, ultrasound UOQ 10:00: 1.6 cm biopsy grade 2 IDC ER 70%, PR greater than 95%, HER2 negative, Ki-67 2%, calcs spanned 7 cm: Biopsy intermediate grade DCIS ER 95%, PR 95% Oncotype DX score 20: 6% risk of recurrence   06/29/2021: Bilateral mastectomies: Left mastectomy: Fibrocystic changes and fibroadenoma 0.6 cm; right mastectomy: 2 cm grade 1 IDC with DCIS, separate focus of patchy DCIS intermediate grade, 0/4 lymph nodes negative ER 70%, PR greater than 95%, HER2 negative, Ki-67 2%   Treatment plan: adjuvant antiestrogen therapy (we discussed ovarian suppression with AI versus tamoxifen).  Family decided on tamoxifen instead.   Tamoxifen toxicities: Very mild hot flashes but otherwise tolerating it extremely well.  No issues with menstrual cycles.  She thinks it is lightened up a bit.   Breast cancer surveillance: Breast exam 04/19/2023: Benign No role of imaging since she had bilateral mastectomies   Return to clinic in 1 year for follow-up

## 2023-04-22 ENCOUNTER — Ambulatory Visit
Admission: RE | Admit: 2023-04-22 | Discharge: 2023-04-22 | Disposition: A | Discharge status: Home / Self Care | Source: Ambulatory Visit | Attending: Hematology and Oncology | Admitting: Hematology and Oncology

## 2023-04-22 DIAGNOSIS — Z9013 Acquired absence of bilateral breasts and nipples: Secondary | ICD-10-CM | POA: Diagnosis not present

## 2023-04-22 DIAGNOSIS — C50411 Malignant neoplasm of upper-outer quadrant of right female breast: Secondary | ICD-10-CM

## 2023-04-22 DIAGNOSIS — Z853 Personal history of malignant neoplasm of breast: Secondary | ICD-10-CM | POA: Diagnosis not present

## 2023-04-22 MED ORDER — GADOPICLENOL 0.5 MMOL/ML IV SOLN
7.0000 mL | Freq: Once | INTRAVENOUS | Status: AC | PRN
  Administered 2023-04-22: 7 mL via INTRAVENOUS

## 2023-05-09 ENCOUNTER — Ambulatory Visit: Payer: BC Managed Care – PPO | Attending: General Surgery

## 2023-05-09 VITALS — Wt 159.5 lb

## 2023-05-09 DIAGNOSIS — Z483 Aftercare following surgery for neoplasm: Secondary | ICD-10-CM | POA: Insufficient documentation

## 2023-05-09 NOTE — Therapy (Signed)
 OUTPATIENT PHYSICAL THERAPY SOZO SCREENING NOTE   Patient Name: Jackie Miller MRN: [id number] DOB:November 08, 1980, 43 y.o., female Today's Date: 05/09/2023  PCP: Ronnald Ramp, MD REFERRING PROVIDER: Griselda Miner, MD   PT End of Session - 05/09/23 0809     Visit Number 3   # unchanged due to screen only   PT Start Time 0808    PT Stop Time 0811    PT Time Calculation (min) 3 min    Activity Tolerance Patient tolerated treatment well    Behavior During Therapy WFL for tasks assessed/performed             Past Medical History:  Diagnosis Date   Allergy    Asthma    Asthma    Bursitis    right hip   Cancer (HCC)    Breast   Chicken pox    Family history of adverse reaction to anesthesia    Father - slow to wake   FH: colon cancer    mom died age 53 yo   Hay fever    Headache    tension/cluster   Jaundice    as a baby   Motion sickness    moving vehicles   PCOS (polycystic ovarian syndrome)    PONV (postoperative nausea and vomiting)    Wears contact lenses    Past Surgical History:  Procedure Laterality Date   BREAST BIOPSY Right 04/24/2021   BREAST BIOPSY Left 05/25/2021   BREAST RECONSTRUCTION WITH PLACEMENT OF TISSUE EXPANDER AND FLEX HD (ACELLULAR HYDRATED DERMIS) Bilateral 06/29/2021   Procedure: BILATERAL BREAST RECONSTRUCTION WITH PLACEMENT OF TISSUE EXPANDER AND DERMAL MATRIX SUBSTITUTE;  Surgeon: Peggye Form, DO;  Location: [facility];  Service: Plastics;  Laterality: Bilateral;   COLONOSCOPY WITH PROPOFOL N/A 04/10/2019   Procedure: COLONOSCOPY WITH PROPOFOL;  Surgeon: Midge Minium, MD;  Location: [facility];  Service: Endoscopy;  Laterality: N/A;   EYE SURGERY Bilateral 02/16/2004   lasik's   MASTECTOMY W/ SENTINEL NODE BIOPSY Right 06/29/2021   Procedure: RIGHT MASTECTOMY WITH SENTINEL LYMPH NODE BIOPSY;  Surgeon: Griselda Miner, MD;  Location: [facility];  Service: General;  Laterality: Right;    MYRINGOTOMY WITH TUBE PLACEMENT     REMOVAL OF BILATERAL TISSUE EXPANDERS WITH PLACEMENT OF BILATERAL BREAST IMPLANTS Bilateral 09/09/2021   Procedure: REMOVAL OF BILATERAL TISSUE EXPANDERS WITH PLACEMENT OF BILATERAL BREAST IMPLANTS;  Surgeon: Peggye Form, DO;  Location: [facility];  Service: Plastics;  Laterality: Bilateral;  2 hours   TONSILLECTOMY AND ADENOIDECTOMY     TOTAL MASTECTOMY Left 06/29/2021   Procedure: LEFT TOTAL MASTECTOMY;  Surgeon: Griselda Miner, MD;  Location:  SUR[facility]ERY CENTER;  Service: General;  Laterality: Left;   Patient Active Problem List   Diagnosis Date Noted   COVID-19 vaccination declined 08/10/2022   Decreased energy 04/08/2022   Healthcare maintenance 04/08/2022   Vitamin D deficiency 04/08/2022   Breast cancer (HCC) 06/29/2021   Easy bruising 05/06/2021   Malignant neoplasm of upper-outer quadrant of right breast in female, estrogen receptor positive (HCC) 05/01/2021   Chondromalacia of right patella 01/28/2021   Annual physical exam 10/08/2019   Family history of colon cancer requiring screening colonoscopy 02/26/2019   PCOS (polycystic ovarian syndrome) 04/27/2015   Intolerance to cold 04/27/2015   Asthma 04/27/2015    REFERRING DIAG: right breast cancer at risk for lymphedema  THERAPY DIAG:Aftercare following surgery for neoplasm  PERTINENT HISTORY: Patient was diagnosed on  03/19/2021 with right grade II invasive ductal carcinoma breast cancer. She underwent a bilateral mastectomy with a right sentinel node biopsy (4 negative nodes) on 06/29/2021 with expanders placed at that time for reconstruction. It is ER/PR positive and HER2 negative with a Ki67 of 2%.   PRECAUTIONS: right UE Lymphedema risk  SUBJECTIVE: Pt returns for her 3 month L-Dex screen.   PAIN:  Are you having pain? No  SOZO SCREENING: Patient was assessed today using the SOZO machine to determine the lymphedema index score. This was compared to her  baseline score. It was determined that she is within the recommended range when compared to her baseline and no further action is needed at this time. She will continue SOZO screenings. These are done every 3 months for 2 years post operatively followed by every 6 months for 2 years, and then annually.   L-DEX FLOWSHEETS - 05/09/23 0800       L-DEX LYMPHEDEMA SCREENING   Measurement Type Unilateral    L-DEX MEASUREMENT EXTREMITY Upper Extremity    POSITION  Standing    DOMINANT SIDE Left    At Risk Side Right    BASELINE SCORE (UNILATERAL) 2    L-DEX SCORE (UNILATERAL) 0.8    VALUE CHANGE (UNILAT) -1.2              Hermenia Bers, PTA 05/09/2023, 8:10 AM

## 2023-05-16 DIAGNOSIS — Z1151 Encounter for screening for human papillomavirus (HPV): Secondary | ICD-10-CM | POA: Diagnosis not present

## 2023-05-16 DIAGNOSIS — Z01419 Encounter for gynecological examination (general) (routine) without abnormal findings: Secondary | ICD-10-CM | POA: Diagnosis not present

## 2023-05-16 DIAGNOSIS — Z6825 Body mass index (BMI) 25.0-25.9, adult: Secondary | ICD-10-CM | POA: Diagnosis not present

## 2023-05-16 DIAGNOSIS — Z124 Encounter for screening for malignant neoplasm of cervix: Secondary | ICD-10-CM | POA: Diagnosis not present

## 2023-05-20 LAB — HM PAP SMEAR: HPV, high-risk: NEGATIVE

## 2023-08-08 ENCOUNTER — Ambulatory Visit: Attending: General Surgery

## 2023-08-08 VITALS — Wt 160.1 lb

## 2023-08-08 DIAGNOSIS — Z483 Aftercare following surgery for neoplasm: Secondary | ICD-10-CM | POA: Insufficient documentation

## 2023-08-08 NOTE — Therapy (Signed)
 OUTPATIENT PHYSICAL THERAPY SOZO SCREENING NOTE   Patient Name: Jackie Miller MRN: [id number] DOB:01-30-1981, 43 y.o., female Today's Date: 08/08/2023  PCP: Sharma Coyer, MD REFERRING PROVIDER: Curvin Deward MOULD, MD   PT End of Session - 08/08/23 0805     Visit Number 3   # unchanged due to screen only   PT Start Time 0804    PT Stop Time 0807    PT Time Calculation (min) 3 min    Activity Tolerance Patient tolerated treatment well    Behavior During Therapy WFL for tasks assessed/performed          Past Medical History:  Diagnosis Date   Allergy    Asthma    Asthma    Bursitis    right hip   Cancer (HCC)    Breast   Chicken pox    Family history of adverse reaction to anesthesia    Father - slow to wake   FH: colon cancer    mom died age 75 yo   Hay fever    Headache    tension/cluster   Jaundice    as a baby   Motion sickness    moving vehicles   PCOS (polycystic ovarian syndrome)    PONV (postoperative nausea and vomiting)    Wears contact lenses    Past Surgical History:  Procedure Laterality Date   BREAST BIOPSY Right 04/24/2021   BREAST BIOPSY Left 05/25/2021   BREAST RECONSTRUCTION WITH PLACEMENT OF TISSUE EXPANDER AND FLEX HD (ACELLULAR HYDRATED DERMIS) Bilateral 06/29/2021   Procedure: BILATERAL BREAST RECONSTRUCTION WITH PLACEMENT OF TISSUE EXPANDER AND DERMAL MATRIX SUBSTITUTE;  Surgeon: Lowery Estefana RAMAN, DO;  Location: [facility];  Service: Plastics;  Laterality: Bilateral;   COLONOSCOPY WITH PROPOFOL  N/A 04/10/2019   Procedure: COLONOSCOPY WITH PROPOFOL ;  Surgeon: Jinny Carmine, MD;  Location: [facility];  Service: Endoscopy;  Laterality: N/A;   EYE SURGERY Bilateral 02/16/2004   lasik's   MASTECTOMY W/ SENTINEL NODE BIOPSY Right 06/29/2021   Procedure: RIGHT MASTECTOMY WITH SENTINEL LYMPH NODE BIOPSY;  Surgeon: Curvin Deward MOULD, MD;  Location: [facility];  Service: General;  Laterality: Right;    MYRINGOTOMY WITH TUBE PLACEMENT     REMOVAL OF BILATERAL TISSUE EXPANDERS WITH PLACEMENT OF BILATERAL BREAST IMPLANTS Bilateral 09/09/2021   Procedure: REMOVAL OF BILATERAL TISSUE EXPANDERS WITH PLACEMENT OF BILATERAL BREAST IMPLANTS;  Surgeon: Lowery Estefana RAMAN, DO;  Location: [facility];  Service: Plastics;  Laterality: Bilateral;  2 hours   TONSILLECTOMY AND ADENOIDECTOMY     TOTAL MASTECTOMY Left 06/29/2021   Procedure: LEFT TOTAL MASTECTOMY;  Surgeon: Curvin Deward MOULD, MD;  Location: [facility];  Service: General;  Laterality: Left;   Patient Active Problem List   Diagnosis Date Noted   COVID-19 vaccination declined 08/10/2022   Decreased energy 04/08/2022   Healthcare maintenance 04/08/2022   Vitamin D  deficiency 04/08/2022   Breast cancer (HCC) 06/29/2021   Easy bruising 05/06/2021   Malignant neoplasm of upper-outer quadrant of right breast in female, estrogen receptor positive (HCC) 05/01/2021   Chondromalacia of right patella 01/28/2021   Annual physical exam 10/08/2019   Family history of colon cancer requiring screening colonoscopy 02/26/2019   PCOS (polycystic ovarian syndrome) 04/27/2015   Intolerance to cold 04/27/2015   Asthma 04/27/2015    REFERRING DIAG: right breast cancer at risk for lymphedema  THERAPY DIAG:Aftercare following surgery for neoplasm  PERTINENT HISTORY: Patient was diagnosed on 03/19/2021 with right  grade II invasive ductal carcinoma breast cancer. She underwent a bilateral mastectomy with a right sentinel node biopsy (4 negative nodes) on 06/29/2021 with expanders placed at that time for reconstruction. It is ER/PR positive and HER2 negative with a Ki67 of 2%.   PRECAUTIONS: right UE Lymphedema risk  SUBJECTIVE: Pt returns for her 3 month L-Dex screen. I'll be 3 years in Sept. I want to start 6 months now since my numbers have stayed os low.  PAIN:  Are you having pain? No  SOZO SCREENING: Patient was assessed today  using the SOZO machine to determine the lymphedema index score. This was compared to her baseline score. It was determined that she is within the recommended range when compared to her baseline and no further action is needed at this time. She will continue SOZO screenings. These are done every 3 months for 2 years post operatively followed by every 6 months for 2 years, and then annually.   L-DEX FLOWSHEETS - 08/08/23 0800       L-DEX LYMPHEDEMA SCREENING   Measurement Type Unilateral    L-DEX MEASUREMENT EXTREMITY Upper Extremity    POSITION  Standing    DOMINANT SIDE Left    At Risk Side Right    BASELINE SCORE (UNILATERAL) 2    L-DEX SCORE (UNILATERAL) -0.4    VALUE CHANGE (UNILAT) -2.4         P: Begin 6 month screens.   Aden Berwyn Caldron, PTA 08/08/2023, 8:07 AM

## 2023-08-11 ENCOUNTER — Encounter: Payer: Self-pay | Admitting: Family Medicine

## 2023-10-24 ENCOUNTER — Ambulatory Visit (INDEPENDENT_AMBULATORY_CARE_PROVIDER_SITE_OTHER): Admitting: Family Medicine

## 2023-10-24 ENCOUNTER — Encounter: Payer: Self-pay | Admitting: Family Medicine

## 2023-10-24 VITALS — BP 108/70 | HR 65 | Temp 98.2°F | Ht 67.0 in | Wt 155.4 lb

## 2023-10-24 DIAGNOSIS — Z131 Encounter for screening for diabetes mellitus: Secondary | ICD-10-CM

## 2023-10-24 DIAGNOSIS — J4520 Mild intermittent asthma, uncomplicated: Secondary | ICD-10-CM

## 2023-10-24 DIAGNOSIS — Z0000 Encounter for general adult medical examination without abnormal findings: Secondary | ICD-10-CM

## 2023-10-24 DIAGNOSIS — H6992 Unspecified Eustachian tube disorder, left ear: Secondary | ICD-10-CM

## 2023-10-24 DIAGNOSIS — Z13220 Encounter for screening for lipoid disorders: Secondary | ICD-10-CM | POA: Diagnosis not present

## 2023-10-24 DIAGNOSIS — E559 Vitamin D deficiency, unspecified: Secondary | ICD-10-CM

## 2023-10-24 NOTE — Progress Notes (Signed)
 Complete physical exam   Patient: Jackie Miller   DOB: 1980/06/16   43 y.o. Female  MRN: [id number] Visit Date: 10/24/2023  Today's healthcare provider: Rockie Agent, MD   Chief Complaint  Patient presents with   Annual Exam    Patient presents for annual exam.  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 Requesting [facility] from [facility]     Subjective    KENIAH Miller is a 43 y.o. female who presents today for a complete physical exam.    She does not have additional problems to discuss today.   Discussed the use of AI scribe software for clinical note transcription with the patient, who gave verbal consent to proceed.  History of Present Illness Jackie Miller is a 43 year old female who presents for an annual physical exam.  She reports feeling weak.  She has postnasal drip and congestion attributed to allergies, with daily difficulty clearing her throat. She experiences occasional left-sided throat pain, possibly originating from her ear, which is inconsistent and sometimes lasts a week. This pain can make talking difficult. She has a history of ear infections.  She takes Zyrtec for allergies and has used Flonase in the past, particularly during winter when she had bronchitis, but does not use it regularly.  No sore throat is reported, but she describes pain when swallowing, which is not present today.     Past Medical History:  Diagnosis Date   Allergy    Asthma    Asthma    Bursitis    right hip   Cancer (HCC)    Breast   Chicken pox    Family history of adverse reaction to anesthesia    Father - slow to wake   FH: colon cancer    mom died age 42 yo   Hay fever    Headache    tension/cluster   Jaundice    as a baby   Motion sickness    moving vehicles   PCOS (polycystic ovarian  syndrome)    PONV (postoperative nausea and vomiting)    Wears contact lenses    Past Surgical History:  Procedure Laterality Date   BREAST BIOPSY Right 04/24/2021   BREAST BIOPSY Left 05/25/2021   BREAST RECONSTRUCTION WITH PLACEMENT OF TISSUE EXPANDER AND FLEX HD (ACELLULAR HYDRATED DERMIS) Bilateral 06/29/2021   Procedure: BILATERAL BREAST RECONSTRUCTION WITH PLACEMENT OF TISSUE EXPANDER AND DERMAL MATRIX SUBSTITUTE;  Surgeon: Lowery Estefana RAMAN, DO;  Location: [facility];  Service: Plastics;  Laterality: Bilateral;   COLONOSCOPY WITH PROPOFOL  N/A 04/10/2019   Procedure: COLONOSCOPY WITH PROPOFOL ;  Surgeon: Jinny Carmine, MD;  Location: [facility];  Service: Endoscopy;  Laterality: N/A;   EYE SURGERY Bilateral 02/16/2004   lasik's   MASTECTOMY W/ SENTINEL NODE BIOPSY Right 06/29/2021   Procedure: RIGHT MASTECTOMY WITH SENTINEL LYMPH NODE BIOPSY;  Surgeon: Curvin Deward MOULD, MD;  Location: [facility];  Service: General;  Laterality: Right;   MYRINGOTOMY WITH TUBE PLACEMENT     REMOVAL OF BILATERAL TISSUE EXPANDERS WITH PLACEMENT OF BILATERAL BREAST IMPLANTS Bilateral 09/09/2021   Procedure: REMOVAL OF BILATERAL TISSUE EXPANDERS WITH PLACEMENT OF BILATERAL BREAST IMPLANTS;  Surgeon: Lowery Estefana RAMAN, DO;  Location: [facility];  Service: Plastics;  Laterality: Bilateral;  2 hours  TONSILLECTOMY AND ADENOIDECTOMY     TOTAL MASTECTOMY Left 06/29/2021   Procedure: LEFT TOTAL MASTECTOMY;  Surgeon: Curvin Deward MOULD, MD;  Location: [facility];  Service: General;  Laterality: Left;   Social History   Socioeconomic History   Marital status: Single    Spouse name: Not on file   Number of children: Not on file   Years of education: Not on file   Highest education level: Not on file  Occupational History   Not on file  Tobacco Use   Smoking status: Never   Smokeless tobacco: Never  Vaping Use   Vaping status: Never Used   Substance and Sexual Activity   Alcohol use: No    Alcohol/week: 0.0 - 1.0 standard drinks of alcohol   Drug use: No   Sexual activity: Not Currently  Other Topics Concern   Not on file  Social History Narrative   Single w/o kids as of 02/23/19    No pets    From [facility]-    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       DPR dad Koren [phone] & step mom Reena [phone]   Social Drivers of Health   Financial Resource Strain: Not on file  Food Insecurity: Not on file  Transportation Needs: Not on file  Physical Activity: Not on file  Stress: Not on file  Social Connections: Not on file  Intimate Partner Violence: Not on file   Family Status  Relation Name Status   Mother  Deceased   Father  Alive   Brother  Alive       younger   MGM  Deceased   PGM  Deceased   PGF  Deceased   Cousin  Deceased  No partnership data on file   Family History  Problem Relation Age of Onset   Colon cancer Mother 90   Hypertension Father    Multiple myeloma Father    Breast cancer Maternal Grandmother    Heart attack Paternal Grandmother 76   Lung cancer Paternal Grandfather    Breast cancer Cousin    Allergies  Allergen Reactions   Betadine [Povidone Iodine] Hives   Bacitracin Itching and Rash   Amoxicillin Swelling   Doxycycline  Other (See Comments)    Muscloskeletal pain     Medications: Outpatient Medications Prior to Visit  Medication Sig   cetirizine (ZYRTEC) 10 MG chewable tablet Chew 10 mg by mouth daily.   Cholecalciferol (VITAMIN D ) 50 MCG (2000 UT) tablet Take 2,000 Units by mouth daily.   montelukast  (SINGULAIR ) 10 MG tablet Take 10 mg by mouth at bedtime.   PROAIR  HFA 108 (90 Base) MCG/ACT inhaler Inhale 2 puffs into the lungs every 6 (six) hours as needed.   tamoxifen  (NOLVADEX ) 20 MG tablet Take 1 tablet (20 mg total) by mouth daily.   LORazepam  (ATIVAN ) 1 MG tablet Take 1 tablet (1 mg total) by mouth every 8 (eight)  hours. (Patient not taking: Reported on 10/24/2023)   No facility-administered medications prior to visit.    Review of Systems     Objective    BP 108/70 (BP Location: Left Arm, Patient Position: Sitting, Cuff Size: Normal)   Pulse 65   Temp 98.2 F (36.8 C) (Oral)   Ht 5' 7 (1.702 m)   Wt 155 lb 6.4 oz (70.5 kg)   LMP 10/18/2023 (Exact Date)   SpO2 100%   BMI 24.34 kg/m  Physical Exam Vitals reviewed.  Constitutional:      General: She is not in acute distress.    Appearance: Normal appearance. She is not ill-appearing, toxic-appearing or diaphoretic.  HENT:     Head: Normocephalic and atraumatic.     Comments: No maxillary nor frontal sinus tenderness     Right Ear: External ear normal. A middle ear effusion is present. There is no impacted cerumen. Tympanic membrane is not injected, perforated, erythematous or bulging.     Left Ear: External ear normal. No decreased hearing noted. No drainage or tenderness. A middle ear effusion is present. There is no impacted cerumen. Tympanic membrane is not injected, perforated, erythematous or bulging.     Nose: Nose normal.     Mouth/Throat:     Mouth: Mucous membranes are moist.     Pharynx: Oropharynx is clear. Postnasal drip present. No posterior oropharyngeal erythema.  Eyes:     General: No scleral icterus.    Extraocular Movements: Extraocular movements intact.     Conjunctiva/sclera: Conjunctivae normal.     Pupils: Pupils are equal, round, and reactive to light.  Cardiovascular:     Rate and Rhythm: Normal rate and regular rhythm.     Pulses: Normal pulses.     Heart sounds: Normal heart sounds. No murmur heard.    No friction rub. No gallop.  Pulmonary:     Effort: Pulmonary effort is normal. No respiratory distress.     Breath sounds: Normal breath sounds. No wheezing, rhonchi or rales.  Abdominal:     General: Bowel sounds are normal. There is no distension.     Palpations: Abdomen is soft. There is no  mass.     Tenderness: There is no abdominal tenderness. There is no guarding.  Musculoskeletal:        General: No deformity.     Cervical back: Normal range of motion and neck supple.     Right lower leg: No edema.     Left lower leg: No edema.  Lymphadenopathy:     Cervical: No cervical adenopathy.  Skin:    General: Skin is warm.     Capillary Refill: Capillary refill takes less than 2 seconds.     Findings: No erythema or rash.  Neurological:     General: No focal deficit present.     Mental Status: She is alert and oriented to person, place, and time.     Cranial Nerves: Cranial nerves 2-12 are intact. No cranial nerve deficit or facial asymmetry.     Motor: Motor function is intact. No weakness.     Gait: Gait normal.  Psychiatric:        Mood and Affect: Mood normal.        Behavior: Behavior normal.       Last depression screening scores    10/24/2023    9:12 AM 08/10/2022    3:42 PM 04/07/2022    2:15 PM  PHQ 2/9 Scores  PHQ - 2 Score 0 0 0  PHQ- 9 Score 0 0 2    Last fall risk screening    10/24/2023    9:12 AM  Fall Risk   Falls in the past year? 0  Number falls in past yr: 0  Injury with Fall? 0  Risk for fall due to : No Fall Risks  Follow up Falls evaluation completed    Last Audit-C alcohol use screening    04/07/2022    2:16 PM  Alcohol Use Disorder  Test (AUDIT)  1. How often do you have a drink containing alcohol? 0  2. How many drinks containing alcohol do you have on a typical day when you are drinking? 0  3. How often do you have six or more drinks on one occasion? 0  AUDIT-C Score 0   A score of 3 or more in women, and 4 or more in men indicates increased risk for alcohol abuse, EXCEPT if all of the points are from question 1   No results found for any visits on 10/24/23.  Assessment & Plan    Routine Health Maintenance and Physical Exam  Immunization History  Administered Date(s) Administered   Hepatitis B, ADULT 04/02/2013,  05/23/2013, 03/07/2014   Influenza,inj,Quad PF,6+ Mos 04/18/2015   Tdap 04/18/2015    Health Maintenance  Topic Date Due   Cervical Cancer Screening (HPV/Pap Cotest)  11/19/2019   COVID-19 Vaccine (1) 11/09/2023 (Originally 08/28/1986)   Influenza Vaccine  05/15/2024 (Originally 09/16/2023)   Pneumococcal Vaccine (1 of 2 - PCV) 10/23/2024 (Originally 08/27/2000)   HPV VACCINES (1 - Risk 3-dose SCDM series) 10/23/2024 (Originally 08/27/2008)   Colonoscopy  04/09/2024   DTaP/Tdap/Td (2 - Td or Tdap) 04/17/2025   Hepatitis B Vaccines 19-59 Average Risk  Completed   Hepatitis C Screening  Completed   HIV Screening  Completed   Meningococcal B Vaccine  Aged Out    Problem List Items Addressed This Visit     Annual physical exam - Primary   Relevant Orders   Lipid panel   CBC with Differential/Platelet   Comprehensive Metabolic Panel (CMET)   Asthma   Healthcare maintenance   Relevant Orders   Lipid panel   CBC with Differential/Platelet   Comprehensive Metabolic Panel (CMET)   Vitamin D  deficiency   Relevant Orders   Vitamin D  (25 hydroxy)   Other Visit Diagnoses       Screening for diabetes mellitus       Relevant Orders   HgB A1c     Screening for lipid disorders       Relevant Orders   Lipid panel     Eustachian tube disorder, left           Assessment and Plan Assessment & Plan Adult Wellness Visit Routine annual physical examination. Recent gynecological exam was normal. - Order A1c, metabolic panel, CBC, cholesterol panel, and vitamin D  level - Schedule next annual physical exam - recommend continued regular exercise  - recommend well balanced diet   Allergic rhinitis with postnasal drip Acute on Chronic  Intermittent throat pain and postnasal drip likely due to allergies. Symptoms include congestion and throat discomfort, primarily on the left side. No signs of sinus or ear infection. Current use of Zyrtec, but not Flonase regularly. - Recommend regular use  of Flonase, two sprays each nostril once a day, pt plans to purchase OTC  - Consider switching to Allegra or Claritin if symptoms persist - Use decongestant like Allegra D if very congested  Vitamin D  deficiency Vitamin D  level to be checked as part of routine blood work. - Order vitamin D  level       Return in about 1 year (around 10/23/2024) for CPE.       Rockie Agent, MD  [facility] [phone] (phone) [phone] (fax)  [facility]

## 2023-10-24 NOTE — Patient Instructions (Signed)
 It was a pleasure to see you today!  Thank you for choosing [facility] for your primary care.   Today you were seen for your annual physical  Please review the attached information regarding helpful preventive health topics.   To keep you healthy, please keep in mind the following health maintenance items that you are due for:   Health Maintenance Due  Topic Date Due   Cervical Cancer Screening (HPV/Pap Cotest)  11/19/2019     Best Wishes,   Dr. Lang

## 2023-10-25 LAB — COMPREHENSIVE METABOLIC PANEL WITH GFR
ALT: 7 IU/L (ref 0–32)
AST: 13 IU/L (ref 0–40)
Albumin: 4.4 g/dL (ref 3.9–4.9)
Alkaline Phosphatase: 47 IU/L (ref 44–121)
BUN/Creatinine Ratio: 13 (ref 9–23)
BUN: 10 mg/dL (ref 6–24)
Bilirubin Total: 0.3 mg/dL (ref 0.0–1.2)
CO2: 19 mmol/L — ABNORMAL LOW (ref 20–29)
Calcium: 9.6 mg/dL (ref 8.7–10.2)
Chloride: 109 mmol/L — ABNORMAL HIGH (ref 96–106)
Creatinine, Ser: 0.8 mg/dL (ref 0.57–1.00)
Globulin, Total: 1.8 g/dL (ref 1.5–4.5)
Glucose: 92 mg/dL (ref 70–99)
Potassium: 5.1 mmol/L (ref 3.5–5.2)
Sodium: 143 mmol/L (ref 134–144)
Total Protein: 6.2 g/dL (ref 6.0–8.5)
eGFR: 94 mL/min/1.73 (ref >59)

## 2023-10-25 LAB — CBC WITH DIFFERENTIAL/PLATELET
Basophils Absolute: 0.1 x10E3/uL (ref 0.0–0.2)
Basos: 1 %
EOS (ABSOLUTE): 0.2 x10E3/uL (ref 0.0–0.4)
Eos: 3 %
Hematocrit: 41.9 % (ref 34.0–46.6)
Hemoglobin: 13.5 g/dL (ref 11.1–15.9)
Immature Grans (Abs): 0 x10E3/uL (ref 0.0–0.1)
Immature Granulocytes: 0 %
Lymphocytes Absolute: 1.5 x10E3/uL (ref 0.7–3.1)
Lymphs: 24 %
MCH: 29.2 pg (ref 26.6–33.0)
MCHC: 32.2 g/dL (ref 31.5–35.7)
MCV: 91 fL (ref 79–97)
Monocytes Absolute: 0.6 x10E3/uL (ref 0.1–0.9)
Monocytes: 9 %
Neutrophils Absolute: 3.8 x10E3/uL (ref 1.4–7.0)
Neutrophils: 63 %
Platelets: 266 x10E3/uL (ref 150–450)
RBC: 4.63 x10E6/uL (ref 3.77–5.28)
RDW: 11.9 % (ref 11.7–15.4)
WBC: 6.1 x10E3/uL (ref 3.4–10.8)

## 2023-10-25 LAB — VITAMIN D 25 HYDROXY (VIT D DEFICIENCY, FRACTURES): Vit D, 25-Hydroxy: 52 ng/mL (ref 30.0–100.0)

## 2023-10-25 LAB — HEMOGLOBIN A1C
Est. average glucose Bld gHb Est-mCnc: 100 mg/dL
Hgb A1c MFr Bld: 5.1 % (ref 4.8–5.6)

## 2023-10-26 ENCOUNTER — Ambulatory Visit: Payer: Self-pay | Admitting: Family Medicine

## 2023-10-28 ENCOUNTER — Encounter: Payer: Self-pay | Admitting: Obstetrics and Gynecology

## 2023-11-04 ENCOUNTER — Encounter: Payer: Self-pay | Admitting: Hematology and Oncology

## 2023-11-11 ENCOUNTER — Ambulatory Visit (INDEPENDENT_AMBULATORY_CARE_PROVIDER_SITE_OTHER): Admitting: Plastic Surgery

## 2023-11-11 VITALS — BP 120/78

## 2023-11-11 DIAGNOSIS — C50411 Malignant neoplasm of upper-outer quadrant of right female breast: Secondary | ICD-10-CM

## 2023-11-11 DIAGNOSIS — N651 Disproportion of reconstructed breast: Secondary | ICD-10-CM | POA: Diagnosis not present

## 2023-11-11 DIAGNOSIS — Z9013 Acquired absence of bilateral breasts and nipples: Secondary | ICD-10-CM | POA: Diagnosis not present

## 2023-11-11 DIAGNOSIS — C50919 Malignant neoplasm of unspecified site of unspecified female breast: Secondary | ICD-10-CM

## 2023-11-11 DIAGNOSIS — Z170 Estrogen receptor positive status [ER+]: Secondary | ICD-10-CM

## 2023-11-11 DIAGNOSIS — Z853 Personal history of malignant neoplasm of breast: Secondary | ICD-10-CM | POA: Diagnosis not present

## 2023-11-11 NOTE — Progress Notes (Signed)
   Subjective:    Patient ID: Jackie Miller, female    DOB: [birth year], 43 y.o.   MRN: [id number]  The patient is a 43 year old female here for evaluation of her breast.  She was diagnosed with right breast cancer in 2023.  She underwent bilateral mastectomies with expander placement.  Then in July 2023 she had Mentor smooth round ultra high-profile gel 350 cc implants placed bilaterally.  She has a very long torso and this is adding to the look of the implants being lower than she would like.  The right side looks like it is dropped about a centimeter in comparison to the left.  This is making it a little difficult for her to feel comfortable in bras or a bathing suit.      Review of Systems  Constitutional: Negative.   HENT: Negative.    Eyes: Negative.   Respiratory: Negative.    Cardiovascular: Negative.   Gastrointestinal: Negative.   Endocrine: Negative.   Genitourinary: Negative.   Musculoskeletal: Negative.        Objective:   Physical Exam Vitals reviewed.  Constitutional:      Appearance: Normal appearance.  HENT:     Head: Atraumatic.  Cardiovascular:     Rate and Rhythm: Normal rate.     Pulses: Normal pulses.  Pulmonary:     Effort: Pulmonary effort is normal.  Skin:    Capillary Refill: Capillary refill takes less than 2 seconds.  Neurological:     Mental Status: She is alert and oriented to person, place, and time.  Psychiatric:        Mood and Affect: Mood normal.        Behavior: Behavior normal.        Thought Content: Thought content normal.        Judgment: Judgment normal.           Assessment & Plan:     ICD-10-CM   1. Malignant neoplasm of female breast, unspecified estrogen receptor status, unspecified laterality, unspecified site of breast (HCC)  C50.919     2. Malignant neoplasm of upper-outer quadrant of right breast in female, estrogen receptor positive (HCC)  C50.411    Z17.0     3. Breast asymmetry following reconstructive surgery   N65.1        The patient is a candidate for bilateral removal of excess skin of the breasts and repositioning of the right breast implant.  Pictures were obtained of the patient and placed in the chart with the patient's or guardian's permission.

## 2024-02-06 ENCOUNTER — Ambulatory Visit

## 2024-02-08 ENCOUNTER — Ambulatory Visit: Attending: General Surgery | Admitting: Rehabilitation

## 2024-02-08 DIAGNOSIS — Z483 Aftercare following surgery for neoplasm: Secondary | ICD-10-CM | POA: Insufficient documentation

## 2024-02-08 NOTE — Therapy (Signed)
 " OUTPATIENT PHYSICAL THERAPY SOZO SCREENING NOTE   Patient Name: Jackie Miller MRN: [id number] DOB:[birth year], 43 y.o., female Today's Date: 02/08/2024  PCP: Sharma Coyer, MD REFERRING PROVIDER: Curvin Deward MOULD, MD   PT End of Session - 02/08/24 0954     Visit Number 3   screen   PT Start Time 0950    PT Stop Time 0955    PT Time Calculation (min) 5 min    Activity Tolerance Patient tolerated treatment well    Behavior During Therapy WFL for tasks assessed/performed          Past Medical History:  Diagnosis Date   Allergy    Asthma    Asthma    Bursitis    right hip   Cancer (HCC)    Breast   Chicken pox    Family history of adverse reaction to anesthesia    Father - slow to wake   FH: colon cancer    mom died age 62 yo   Hay fever    Headache    tension/cluster   Jaundice    as a baby   Motion sickness    moving vehicles   PCOS (polycystic ovarian syndrome)    PONV (postoperative nausea and vomiting)    Wears contact lenses    Past Surgical History:  Procedure Laterality Date   BREAST BIOPSY Right 04/24/2021   BREAST BIOPSY Left 05/25/2021   BREAST RECONSTRUCTION WITH PLACEMENT OF TISSUE EXPANDER AND FLEX HD (ACELLULAR HYDRATED DERMIS) Bilateral 06/29/2021   Procedure: BILATERAL BREAST RECONSTRUCTION WITH PLACEMENT OF TISSUE EXPANDER AND DERMAL MATRIX SUBSTITUTE;  Surgeon: Lowery Estefana RAMAN, DO;  Location: [facility];  Service: Plastics;  Laterality: Bilateral;   COLONOSCOPY WITH PROPOFOL  N/A 04/10/2019   Procedure: COLONOSCOPY WITH PROPOFOL ;  Surgeon: Jinny Carmine, MD;  Location: [facility];  Service: Endoscopy;  Laterality: N/A;   EYE SURGERY Bilateral 02/16/2004   lasik's   MASTECTOMY W/ SENTINEL NODE BIOPSY Right 06/29/2021   Procedure: RIGHT MASTECTOMY WITH SENTINEL LYMPH NODE BIOPSY;  Surgeon: Curvin Deward MOULD, MD;  Location: [facility];  Service: General;  Laterality: Right;   MYRINGOTOMY WITH TUBE  PLACEMENT     REMOVAL OF BILATERAL TISSUE EXPANDERS WITH PLACEMENT OF BILATERAL BREAST IMPLANTS Bilateral 09/09/2021   Procedure: REMOVAL OF BILATERAL TISSUE EXPANDERS WITH PLACEMENT OF BILATERAL BREAST IMPLANTS;  Surgeon: Lowery Estefana RAMAN, DO;  Location: [facility];  Service: Plastics;  Laterality: Bilateral;  2 hours   TONSILLECTOMY AND ADENOIDECTOMY     TOTAL MASTECTOMY Left 06/29/2021   Procedure: LEFT TOTAL MASTECTOMY;  Surgeon: Curvin Deward MOULD, MD;  Location: [facility];  Service: General;  Laterality: Left;   Patient Active Problem List   Diagnosis Date Noted   Breast asymmetry following reconstructive surgery 11/11/2023   COVID-19 vaccination declined 08/10/2022   Decreased energy 04/08/2022   Healthcare maintenance 04/08/2022   Vitamin D  deficiency 04/08/2022   Breast cancer (HCC) 06/29/2021   Easy bruising 05/06/2021   Malignant neoplasm of upper-outer quadrant of right breast in female, estrogen receptor positive (HCC) 05/01/2021   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 She underwent a bilateral mastectomy with a right sentinel node biopsy (4 negative nodes) on 06/29/2021 with expanders placed at that time for reconstruction. It is ER/PR positive and HER2 negative with a Ki67 of 2%.   PRECAUTIONS: right UE Lymphedema risk  SUBJECTIVE: Pt returns for her 3 month L-Dex screen. I'll be 3 years in Sept. I want to start 6 months now since my numbers have stayed os low.  PAIN:  Are you having pain?  No  SOZO SCREENING: Patient was assessed today using the SOZO machine to determine the lymphedema index score. This was compared to her baseline score. It was determined that she is within the recommended range when compared to her baseline and no further action is needed at this time. She will continue SOZO screenings. These are done every 3 months for 2 years post operatively followed by every 6 months for 2 years, and then annually.   L-DEX FLOWSHEETS - 02/08/24 0900       L-DEX LYMPHEDEMA SCREENING   Measurement Type Unilateral    L-DEX MEASUREMENT EXTREMITY Upper Extremity    POSITION  Standing    DOMINANT SIDE Left    At Risk Side Right    BASELINE SCORE (UNILATERAL) 2    L-DEX SCORE (UNILATERAL) 2.9    VALUE CHANGE (UNILAT) 0.9         P:  6 month screens.   Jackie Miller, PT 02/08/2024, 9:55 AM    "

## 2024-02-21 NOTE — Progress Notes (Signed)
 "    Patient ID: Jackie Miller, female    DOB: [birth year], 44 y.o.   MRN: [id number]  Chief Complaint  Patient presents with   Pre-op Exam      ICD-10-CM   1. S/P breast reconstruction  Z98.890        History of Present Illness: Jackie Miller is a 44 y.o.  female  with a history of right-sided breast cancer with bilateral mastectomy and reconstruction .  She presents for preoperative evaluation for upcoming procedure, removal of excess tissue bilaterally with possible repositioning of right breast implant, scheduled for 03/22/2024 with Dr. Lowery.  The patient has not had problems with anesthesia aside from mild PONV.  She denies any significant changes in her interim health.  She does have family history of pulmonary embolism (mother).  This was in the setting of end-stage cancer.  She denies any personal history of DVT/PE or clotting disorder.  She denies any significant cardiac or pulmonary disease, varicosities, or nicotine use.  Her asthma is controlled.  History of the right breast cancer.  Recommend holding tamoxifen  2 weeks prior to surgery and restart 1 week afterwards.  She will also hold any vitamins/supplements 1 week prior to surgery.  Discussed expectations regarding surgery and she is understanding and agreeable to proceed.  She tells me that she has a Betadine allergy and is requesting that a another skin prep be used.  Summary of Previous Visit: Patient met with Dr. Lowery 11/11/2023 for follow-up.  At that time, she was concerned that the implants had dropped, right side a bit lower than the left.  Discussed possible repositioning as well as removal of excess skin of the breasts.  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  PMH Significant for: Right-sided breast cancer on maintenance tamoxifen , asthma.   Past Medical History: Allergies: Allergies[1]  Current Medications: Current Medications[2]  Past Medical Problems: Past  Medical History:  Diagnosis Date   Allergy    Asthma    Asthma    Bursitis    right hip   Cancer (HCC)    Breast   Chicken pox    Family history of adverse reaction to anesthesia    Father - slow to wake   FH: colon cancer    mom died age 36 yo   Hay fever    Headache    tension/cluster   Jaundice    as a baby   Motion sickness    moving vehicles   PCOS (polycystic ovarian syndrome)    PONV (postoperative nausea and vomiting)    Wears contact lenses     Past Surgical History: Past Surgical History:  Procedure Laterality Date   BREAST BIOPSY Right 04/24/2021   BREAST BIOPSY Left 05/25/2021   BREAST RECONSTRUCTION WITH PLACEMENT OF TISSUE EXPANDER AND FLEX HD (ACELLULAR HYDRATED DERMIS) Bilateral 06/29/2021   Procedure: BILATERAL BREAST RECONSTRUCTION WITH PLACEMENT OF TISSUE EXPANDER AND DERMAL MATRIX SUBSTITUTE;  Surgeon: Lowery Estefana RAMAN, DO;  Location: [facility];  Service: Plastics;  Laterality: Bilateral;   COLONOSCOPY WITH PROPOFOL  N/A 04/10/2019   Procedure: COLONOSCOPY WITH PROPOFOL ;  Surgeon: Jinny Carmine, MD;  Location: [facility];  Service: Endoscopy;  Laterality: N/A;   EYE SURGERY Bilateral 02/16/2004   lasik's   MASTECTOMY W/ SENTINEL NODE BIOPSY Right 06/29/2021   Procedure: RIGHT MASTECTOMY WITH SENTINEL LYMPH NODE BIOPSY;  Surgeon: Curvin Deward MOULD, MD;  Location: [facility];  Service: General;  Laterality: Right;   MYRINGOTOMY WITH TUBE PLACEMENT     REMOVAL OF BILATERAL TISSUE EXPANDERS WITH PLACEMENT OF BILATERAL BREAST IMPLANTS Bilateral 09/09/2021   Procedure: REMOVAL OF BILATERAL TISSUE EXPANDERS WITH PLACEMENT OF BILATERAL BREAST IMPLANTS;  Surgeon: Lowery Estefana RAMAN, DO;  Location: [facility];  Service: Plastics;  Laterality: Bilateral;  2 hours   TONSILLECTOMY AND ADENOIDECTOMY     TOTAL MASTECTOMY Left 06/29/2021   Procedure: LEFT TOTAL MASTECTOMY;  Surgeon: Curvin Deward MOULD, MD;  Location:  [facility];  Service: General;  Laterality: Left;    Social History: Social History   Socioeconomic History   Marital status: Single    Spouse name: Not on file   Number of children: Not on file   Years of education: Not on file   Highest education level: Not on file  Occupational History   Not on file  Tobacco Use   Smoking status: Never   Smokeless tobacco: Never  Vaping Use   Vaping status: Never Used  Substance and Sexual Activity   Alcohol use: No    Alcohol/week: 0.0 - 1.0 standard drinks of alcohol   Drug use: No   Sexual activity: Not Currently  Other Topics Concern   Not on file  Social History Narrative   Single w/o kids as of 02/23/19    No pets    From [facility]    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       DPR dad Koren [phone] & step mom Reena [phone]   Social Drivers of Health   Tobacco Use: Low Risk (02/23/2024)   Patient History    Smoking Tobacco Use: Never    Smokeless Tobacco Use: Never    Passive Exposure: Not on file  Financial Resource Strain: Not on file  Food Insecurity: Not on file  Transportation Needs: Not on file  Physical Activity: Not on file  Stress: Not on file  Social Connections: Not on file  Intimate Partner Violence: Not on file  Depression (PHQ2-9): Low Risk (10/24/2023)   Depression (PHQ2-9)    PHQ-2 Score: 0  Alcohol Screen: Low Risk (04/07/2022)   Alcohol Screen    Last Alcohol Screening Score (AUDIT): 0  Housing: Unknown (09/29/2023)   Received from [facility]   Epic    Unable to Pay for Housing in the Last Year: Not on file    Number of Times Moved in the Last Year: Not on file    At any time in the past 12 months, were you homeless or living in a shelter (including now)?: No  Utilities: Not on file  Health Literacy: Not on file    Family History: Family History  Problem Relation Age of Onset   Colon cancer Mother 62   Hypertension  Father    Multiple myeloma Father    Breast cancer Maternal Grandmother    Heart attack Paternal Grandmother 47   Lung cancer Paternal Grandfather    Breast cancer Cousin     Review of Systems: ROS She denies any recent trauma or infection.  Physical Exam: Vital Signs BP 130/81 (Cuff Size: Normal)   Pulse 77   Ht 5' 6 (1.676 m)   Wt 152 lb (68.9 kg)   SpO2 96%   BMI 24.53 kg/m   Physical Exam Constitutional:      General: Not in acute distress.    Appearance: Normal appearance. Not ill-appearing.  HENT:     Head: Normocephalic and atraumatic.  Eyes:     Pupils: Pupils are equal, round. Cardiovascular:     Rate and Rhythm: Normal rate.    Pulses: Normal pulses.  Pulmonary:     Effort: No respiratory distress or increased work of breathing.  Speaks in full sentences. Abdominal:     General: Abdomen is flat. No distension.   Musculoskeletal: Normal range of motion. No lower extremity swelling or edema. No varicosities. Skin:    General: Skin is warm and dry.     Findings: No erythema or rash.  Neurological:     Mental Status: Alert and oriented to person, place, and time.  Psychiatric:        Mood and Affect: Mood normal.        Behavior: Behavior normal.    Assessment/Plan: The patient is scheduled for removal of excess tissue bilaterally with possible repositioning of right breast implant with Dr. Lowery.  Risks, benefits, and alternatives of procedure discussed, questions answered and consent obtained.    Smoking Status: Non-smoker.  Last Mammogram: 04/2023; Results: BI-RADS Category 2: Benign.  Caprini Score: 8; Risk Factors include: Age, family history of pulmonary embolism (mother), personal history of breast cancer, and length of planned surgery. Recommendation for mechanical and possibly pharmacological prophylaxis.  Will discuss with Dr. Lowery and prescribe Lovenox if indicated.  Encourage early ambulation.   Pictures obtained:  11/11/2023  Post-op Rx sent to pharmacy: Clindamycin , oxycodone , and Zofran .   Patient was provided with the General Surgical Risk consent document and Pain Medication Agreement prior to their appointment.  They had adequate time to read through the risk consent documents and Pain Medication Agreement. We also discussed them in person together during this preop appointment. All of their questions were answered to their satisfaction.  Recommended calling if they have any further questions.  Risk consent form and Pain Medication Agreement to be scanned into patient's chart.    Electronically signed by: Honora Seip, PA-C 02/23/2024 9:19 AM     [1]  Allergies Allergen Reactions   Betadine [Povidone Iodine] Hives   Bacitracin Itching and Rash   Amoxicillin Swelling   Doxycycline  Other (See Comments)    Muscloskeletal pain  [2]  Current Outpatient Medications:    APPLE CIDER VINEGAR PO, Take by mouth., Disp: , Rfl:    Cholecalciferol (VITAMIN D ) 50 MCG (2000 UT) tablet, Take 2,000 Units by mouth daily., Disp: , Rfl:    clindamycin  (CLEOCIN ) 150 MG capsule, Take 3 capsules (450 mg total) by mouth 3 (three) times daily for 5 days., Disp: 45 capsule, Rfl: 0   ELDERBERRY PO, Take by mouth., Disp: , Rfl:    L-Theanine 200 MG CAPS, Take by mouth., Disp: , Rfl:    montelukast  (SINGULAIR ) 10 MG tablet, Take 10 mg by mouth at bedtime., Disp: , Rfl:    ondansetron  (ZOFRAN -ODT) 4 MG disintegrating tablet, Take 1 tablet (4 mg total) by mouth every 8 (eight) hours as needed for nausea or vomiting., Disp: 20 tablet, Rfl: 0   oxyCODONE  (ROXICODONE ) 5 MG immediate release tablet, Take 1 tablet (5 mg total) by mouth every 8 (eight) hours as needed for up to 5 days for severe pain (pain score 7-10)., Disp: 6 tablet, Rfl: 0   PROAIR  HFA 108 (90 Base) MCG/ACT inhaler, Inhale 2 puffs into the lungs every 6 (six) hours as needed., Disp: 18 g, Rfl: 1   tamoxifen  (NOLVADEX ) 20 MG tablet, Take 1 tablet (20 mg total)  by mouth daily., Disp: 90 tablet, Rfl: 3   cetirizine (ZYRTEC) 10 MG chewable tablet, Chew 10 mg by mouth daily. (Patient not taking: Reported on 02/23/2024), Disp: , Rfl:    LORazepam  (ATIVAN ) 1 MG tablet, Take 1 tablet (1 mg total) by mouth every 8 (eight) hours. (Patient not taking: Reported on 02/23/2024), Disp: 5 tablet, Rfl: 0  "

## 2024-02-21 NOTE — H&P (View-Only) (Signed)
 "    Patient ID: Jackie Miller, female    DOB: [birth year], 44 y.o.   MRN: [id number]  Chief Complaint  Patient presents with   Pre-op Exam      ICD-10-CM   1. S/P breast reconstruction  Z98.890        History of Present Illness: Jackie Miller is a 44 y.o.  female  with a history of right-sided breast cancer with bilateral mastectomy and reconstruction .  She presents for preoperative evaluation for upcoming procedure, removal of excess tissue bilaterally with possible repositioning of right breast implant, scheduled for 03/22/2024 with Dr. Lowery.  The patient has not had problems with anesthesia aside from mild PONV.  She denies any significant changes in her interim health.  She does have family history of pulmonary embolism (mother).  This was in the setting of end-stage cancer.  She denies any personal history of DVT/PE or clotting disorder.  She denies any significant cardiac or pulmonary disease, varicosities, or nicotine use.  Her asthma is controlled.  History of the right breast cancer.  Recommend holding tamoxifen  2 weeks prior to surgery and restart 1 week afterwards.  She will also hold any vitamins/supplements 1 week prior to surgery.  Discussed expectations regarding surgery and she is understanding and agreeable to proceed.  She tells me that she has a Betadine allergy and is requesting that a another skin prep be used.  Summary of Previous Visit: Patient met with Dr. Lowery 11/11/2023 for follow-up.  At that time, she was concerned that the implants had dropped, right side a bit lower than the left.  Discussed possible repositioning as well as removal of excess skin of the breasts.  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  PMH Significant for: Right-sided breast cancer on maintenance tamoxifen , asthma.   Past Medical History: Allergies: Allergies[1]  Current Medications: Current Medications[2]  Past Medical Problems: Past  Medical History:  Diagnosis Date   Allergy    Asthma    Asthma    Bursitis    right hip   Cancer (HCC)    Breast   Chicken pox    Family history of adverse reaction to anesthesia    Father - slow to wake   FH: colon cancer    mom died age 36 yo   Hay fever    Headache    tension/cluster   Jaundice    as a baby   Motion sickness    moving vehicles   PCOS (polycystic ovarian syndrome)    PONV (postoperative nausea and vomiting)    Wears contact lenses     Past Surgical History: Past Surgical History:  Procedure Laterality Date   BREAST BIOPSY Right 04/24/2021   BREAST BIOPSY Left 05/25/2021   BREAST RECONSTRUCTION WITH PLACEMENT OF TISSUE EXPANDER AND FLEX HD (ACELLULAR HYDRATED DERMIS) Bilateral 06/29/2021   Procedure: BILATERAL BREAST RECONSTRUCTION WITH PLACEMENT OF TISSUE EXPANDER AND DERMAL MATRIX SUBSTITUTE;  Surgeon: Lowery Estefana RAMAN, DO;  Location: [facility];  Service: Plastics;  Laterality: Bilateral;   COLONOSCOPY WITH PROPOFOL  N/A 04/10/2019   Procedure: COLONOSCOPY WITH PROPOFOL ;  Surgeon: Jinny Carmine, MD;  Location: [facility];  Service: Endoscopy;  Laterality: N/A;   EYE SURGERY Bilateral 02/16/2004   lasik's   MASTECTOMY W/ SENTINEL NODE BIOPSY Right 06/29/2021   Procedure: RIGHT MASTECTOMY WITH SENTINEL LYMPH NODE BIOPSY;  Surgeon: Curvin Deward MOULD, MD;  Location: [facility];  Service: General;  Laterality: Right;   MYRINGOTOMY WITH TUBE PLACEMENT     REMOVAL OF BILATERAL TISSUE EXPANDERS WITH PLACEMENT OF BILATERAL BREAST IMPLANTS Bilateral 09/09/2021   Procedure: REMOVAL OF BILATERAL TISSUE EXPANDERS WITH PLACEMENT OF BILATERAL BREAST IMPLANTS;  Surgeon: Lowery Estefana RAMAN, DO;  Location: [facility];  Service: Plastics;  Laterality: Bilateral;  2 hours   TONSILLECTOMY AND ADENOIDECTOMY     TOTAL MASTECTOMY Left 06/29/2021   Procedure: LEFT TOTAL MASTECTOMY;  Surgeon: Curvin Deward MOULD, MD;  Location:  [facility];  Service: General;  Laterality: Left;    Social History: Social History   Socioeconomic History   Marital status: Single    Spouse name: Not on file   Number of children: Not on file   Years of education: Not on file   Highest education level: Not on file  Occupational History   Not on file  Tobacco Use   Smoking status: Never   Smokeless tobacco: Never  Vaping Use   Vaping status: Never Used  Substance and Sexual Activity   Alcohol use: No    Alcohol/week: 0.0 - 1.0 standard drinks of alcohol   Drug use: No   Sexual activity: Not Currently  Other Topics Concern   Not on file  Social History Narrative   Single w/o kids as of 02/23/19    No pets    From [facility]-    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       DPR dad Koren [phone] & step mom Reena [phone]   Social Drivers of Health   Tobacco Use: Low Risk (02/23/2024)   Patient History    Smoking Tobacco Use: Never    Smokeless Tobacco Use: Never    Passive Exposure: Not on file  Financial Resource Strain: Not on file  Food Insecurity: Not on file  Transportation Needs: Not on file  Physical Activity: Not on file  Stress: Not on file  Social Connections: Not on file  Intimate Partner Violence: Not on file  Depression (PHQ2-9): Low Risk (10/24/2023)   Depression (PHQ2-9)    PHQ-2 Score: 0  Alcohol Screen: Low Risk (04/07/2022)   Alcohol Screen    Last Alcohol Screening Score (AUDIT): 0  Housing: Unknown (09/29/2023)   Received from [facility]   Epic    Unable to Pay for Housing in the Last Year: Not on file    Number of Times Moved in the Last Year: Not on file    At any time in the past 12 months, were you homeless or living in a shelter (including now)?: No  Utilities: Not on file  Health Literacy: Not on file    Family History: Family History  Problem Relation Age of Onset   Colon cancer Mother 62   Hypertension  Father    Multiple myeloma Father    Breast cancer Maternal Grandmother    Heart attack Paternal Grandmother 47   Lung cancer Paternal Grandfather    Breast cancer Cousin     Review of Systems: ROS She denies any recent trauma or infection.  Physical Exam: Vital Signs BP 130/81 (Cuff Size: Normal)   Pulse 77   Ht 5' 6 (1.676 m)   Wt 152 lb (68.9 kg)   SpO2 96%   BMI 24.53 kg/m   Physical Exam Constitutional:      General: Not in acute distress.    Appearance: Normal appearance. Not ill-appearing.  HENT:     Head: Normocephalic and atraumatic.  Eyes:     Pupils: Pupils are equal, round. Cardiovascular:     Rate and Rhythm: Normal rate.    Pulses: Normal pulses.  Pulmonary:     Effort: No respiratory distress or increased work of breathing.  Speaks in full sentences. Abdominal:     General: Abdomen is flat. No distension.   Musculoskeletal: Normal range of motion. No lower extremity swelling or edema. No varicosities. Skin:    General: Skin is warm and dry.     Findings: No erythema or rash.  Neurological:     Mental Status: Alert and oriented to person, place, and time.  Psychiatric:        Mood and Affect: Mood normal.        Behavior: Behavior normal.    Assessment/Plan: The patient is scheduled for removal of excess tissue bilaterally with possible repositioning of right breast implant with Dr. Lowery.  Risks, benefits, and alternatives of procedure discussed, questions answered and consent obtained.    Smoking Status: Non-smoker.  Last Mammogram: 04/2023; Results: BI-RADS Category 2: Benign.  Caprini Score: 8; Risk Factors include: Age, family history of pulmonary embolism (mother), personal history of breast cancer, and length of planned surgery. Recommendation for mechanical and possibly pharmacological prophylaxis.  Will discuss with Dr. Lowery and prescribe Lovenox if indicated.  Encourage early ambulation.   Pictures obtained:  11/11/2023  Post-op Rx sent to pharmacy: Clindamycin , oxycodone , and Zofran .   Patient was provided with the General Surgical Risk consent document and Pain Medication Agreement prior to their appointment.  They had adequate time to read through the risk consent documents and Pain Medication Agreement. We also discussed them in person together during this preop appointment. All of their questions were answered to their satisfaction.  Recommended calling if they have any further questions.  Risk consent form and Pain Medication Agreement to be scanned into patient's chart.    Electronically signed by: Honora Seip, PA-C 02/23/2024 9:19 AM     [1]  Allergies Allergen Reactions   Betadine [Povidone Iodine] Hives   Bacitracin Itching and Rash   Amoxicillin Swelling   Doxycycline  Other (See Comments)    Muscloskeletal pain  [2]  Current Outpatient Medications:    APPLE CIDER VINEGAR PO, Take by mouth., Disp: , Rfl:    Cholecalciferol (VITAMIN D ) 50 MCG (2000 UT) tablet, Take 2,000 Units by mouth daily., Disp: , Rfl:    clindamycin  (CLEOCIN ) 150 MG capsule, Take 3 capsules (450 mg total) by mouth 3 (three) times daily for 5 days., Disp: 45 capsule, Rfl: 0   ELDERBERRY PO, Take by mouth., Disp: , Rfl:    L-Theanine 200 MG CAPS, Take by mouth., Disp: , Rfl:    montelukast  (SINGULAIR ) 10 MG tablet, Take 10 mg by mouth at bedtime., Disp: , Rfl:    ondansetron  (ZOFRAN -ODT) 4 MG disintegrating tablet, Take 1 tablet (4 mg total) by mouth every 8 (eight) hours as needed for nausea or vomiting., Disp: 20 tablet, Rfl: 0   oxyCODONE  (ROXICODONE ) 5 MG immediate release tablet, Take 1 tablet (5 mg total) by mouth every 8 (eight) hours as needed for up to 5 days for severe pain (pain score 7-10)., Disp: 6 tablet, Rfl: 0   PROAIR  HFA 108 (90 Base) MCG/ACT inhaler, Inhale 2 puffs into the lungs every 6 (six) hours as needed., Disp: 18 g, Rfl: 1   tamoxifen  (NOLVADEX ) 20 MG tablet, Take 1 tablet (20 mg total)  by mouth daily., Disp: 90 tablet, Rfl: 3   cetirizine (ZYRTEC) 10 MG chewable tablet, Chew 10 mg by mouth daily. (Patient not taking: Reported on 02/23/2024), Disp: , Rfl:    LORazepam  (ATIVAN ) 1 MG tablet, Take 1 tablet (1 mg total) by mouth every 8 (eight) hours. (Patient not taking: Reported on 02/23/2024), Disp: 5 tablet, Rfl: 0  "

## 2024-02-23 ENCOUNTER — Ambulatory Visit (INDEPENDENT_AMBULATORY_CARE_PROVIDER_SITE_OTHER): Admitting: Physician Assistant

## 2024-02-23 ENCOUNTER — Encounter: Payer: Self-pay | Admitting: Physician Assistant

## 2024-02-23 VITALS — BP 130/81 | HR 77 | Ht 66.0 in | Wt 152.0 lb

## 2024-02-23 DIAGNOSIS — N651 Disproportion of reconstructed breast: Secondary | ICD-10-CM

## 2024-02-23 DIAGNOSIS — Z853 Personal history of malignant neoplasm of breast: Secondary | ICD-10-CM | POA: Diagnosis not present

## 2024-02-23 DIAGNOSIS — Z9889 Other specified postprocedural states: Secondary | ICD-10-CM

## 2024-02-23 DIAGNOSIS — Z9013 Acquired absence of bilateral breasts and nipples: Secondary | ICD-10-CM

## 2024-02-23 MED ORDER — OXYCODONE HCL 5 MG PO TABS: 5.0000 mg | ORAL_TABLET | Freq: Three times a day (TID) | ORAL | 0 refills | Status: AC | PRN

## 2024-02-23 MED ORDER — CLINDAMYCIN HCL 150 MG PO CAPS: 450.0000 mg | ORAL_CAPSULE | Freq: Three times a day (TID) | ORAL | 0 refills | Status: AC

## 2024-02-23 MED ORDER — ONDANSETRON 4 MG PO TBDP: 4.0000 mg | ORAL_TABLET | Freq: Three times a day (TID) | ORAL | 0 refills | Status: AC | PRN

## 2024-03-14 ENCOUNTER — Encounter (HOSPITAL_BASED_OUTPATIENT_CLINIC_OR_DEPARTMENT_OTHER): Payer: Self-pay | Admitting: Plastic Surgery

## 2024-03-14 ENCOUNTER — Other Ambulatory Visit: Payer: Self-pay

## 2024-03-22 ENCOUNTER — Other Ambulatory Visit: Payer: Self-pay

## 2024-03-22 ENCOUNTER — Ambulatory Visit (HOSPITAL_BASED_OUTPATIENT_CLINIC_OR_DEPARTMENT_OTHER): Admitting: Anesthesiology

## 2024-03-22 ENCOUNTER — Ambulatory Visit (HOSPITAL_BASED_OUTPATIENT_CLINIC_OR_DEPARTMENT_OTHER): Admit: 2024-03-22 | Admitting: Plastic Surgery

## 2024-03-22 ENCOUNTER — Encounter (HOSPITAL_BASED_OUTPATIENT_CLINIC_OR_DEPARTMENT_OTHER): Admission: RE | Disposition: A | Payer: Self-pay | Source: Home / Self Care | Attending: Plastic Surgery

## 2024-03-22 ENCOUNTER — Encounter (HOSPITAL_BASED_OUTPATIENT_CLINIC_OR_DEPARTMENT_OTHER): Payer: Self-pay | Admitting: Plastic Surgery

## 2024-03-22 DIAGNOSIS — Z01818 Encounter for other preprocedural examination: Secondary | ICD-10-CM

## 2024-03-22 LAB — POCT PREGNANCY, URINE: Preg Test, Ur: NEGATIVE

## 2024-03-22 MED ORDER — LACTATED RINGERS IV SOLN: INTRAVENOUS | Status: DC

## 2024-03-22 MED ORDER — ACETAMINOPHEN 325 MG PO TABS: 650.0000 mg | ORAL_TABLET | ORAL | Status: DC | PRN

## 2024-03-22 MED ORDER — PROPOFOL 500 MG/50ML IV EMUL
INTRAVENOUS | Status: DC | PRN
  Administered 2024-03-22: 25 ug/kg/min via INTRAVENOUS

## 2024-03-22 MED ORDER — DEXAMETHASONE SOD PHOSPHATE PF 10 MG/ML IJ SOLN
INTRAMUSCULAR | Status: DC | PRN
  Administered 2024-03-22: 10 mg via INTRAVENOUS

## 2024-03-22 MED ORDER — HYDROMORPHONE HCL 1 MG/ML IJ SOLN
INTRAMUSCULAR | Status: AC
  Filled 2024-03-22: qty 0.5

## 2024-03-22 MED ORDER — DEXAMETHASONE SOD PHOSPHATE PF 10 MG/ML IJ SOLN
INTRAMUSCULAR | Status: AC
  Filled 2024-03-22: qty 1

## 2024-03-22 MED ORDER — FENTANYL CITRATE (PF) 100 MCG/2ML IJ SOLN
INTRAMUSCULAR | Status: AC
  Filled 2024-03-22: qty 2

## 2024-03-22 MED ORDER — SCOPOLAMINE 1 MG/3DAYS TD PT72
MEDICATED_PATCH | TRANSDERMAL | Status: AC
  Filled 2024-03-22: qty 1

## 2024-03-22 MED ORDER — SODIUM CHLORIDE 0.9% FLUSH: 3.0000 mL | Freq: Two times a day (BID) | INTRAVENOUS | Status: DC

## 2024-03-22 MED ORDER — MIDAZOLAM HCL 2 MG/2ML IJ SOLN
INTRAMUSCULAR | Status: AC
  Filled 2024-03-22: qty 2

## 2024-03-22 MED ORDER — DROPERIDOL 2.5 MG/ML IJ SOLN: 0.6250 mg | Freq: Once | INTRAMUSCULAR | Status: DC | PRN

## 2024-03-22 MED ORDER — ACETAMINOPHEN 325 MG RE SUPP: 650.0000 mg | RECTAL | Status: DC | PRN

## 2024-03-22 MED ORDER — SODIUM CHLORIDE 0.9% FLUSH: 3.0000 mL | INTRAVENOUS | Status: DC | PRN

## 2024-03-22 MED ORDER — ONDANSETRON HCL 4 MG/2ML IJ SOLN
INTRAMUSCULAR | Status: DC | PRN
  Administered 2024-03-22: 4 mg via INTRAVENOUS

## 2024-03-22 MED ORDER — CIPROFLOXACIN IN D5W 400 MG/200ML IV SOLN
400.0000 mg | INTRAVENOUS | Status: AC
  Administered 2024-03-22: 400 mg via INTRAVENOUS

## 2024-03-22 MED ORDER — HYDROMORPHONE HCL 1 MG/ML IJ SOLN
0.2500 mg | INTRAMUSCULAR | Status: DC | PRN
  Administered 2024-03-22: 0.5 mg via INTRAVENOUS

## 2024-03-22 MED ORDER — FENTANYL CITRATE (PF) 100 MCG/2ML IJ SOLN
INTRAMUSCULAR | Status: DC | PRN
  Administered 2024-03-22 (×2): 25 ug via INTRAVENOUS
  Administered 2024-03-22: 50 ug via INTRAVENOUS

## 2024-03-22 MED ORDER — MIDAZOLAM HCL 5 MG/5ML IJ SOLN
INTRAMUSCULAR | Status: DC | PRN
  Administered 2024-03-22: 2 mg via INTRAVENOUS

## 2024-03-22 MED ORDER — CHLORHEXIDINE GLUCONATE CLOTH 2 % EX PADS: 6.0000 | MEDICATED_PAD | Freq: Once | CUTANEOUS | Status: DC

## 2024-03-22 MED ORDER — ACETAMINOPHEN 500 MG PO TABS
1000.0000 mg | ORAL_TABLET | Freq: Once | ORAL | Status: AC
  Administered 2024-03-22: 1000 mg via ORAL

## 2024-03-22 MED ORDER — LIDOCAINE 2% (20 MG/ML) 5 ML SYRINGE
INTRAMUSCULAR | Status: AC
  Filled 2024-03-22: qty 5

## 2024-03-22 MED ORDER — PROPOFOL 10 MG/ML IV BOLUS
INTRAVENOUS | Status: DC | PRN
  Administered 2024-03-22: 150 mg via INTRAVENOUS

## 2024-03-22 MED ORDER — SODIUM CHLORIDE 0.9 % IV SOLN: 250.0000 mL | INTRAVENOUS | Status: DC | PRN

## 2024-03-22 MED ORDER — LIDOCAINE 2% (20 MG/ML) 5 ML SYRINGE
INTRAMUSCULAR | Status: DC | PRN
  Administered 2024-03-22: 100 mg via INTRAVENOUS

## 2024-03-22 MED ORDER — OXYCODONE HCL 5 MG PO TABS: 5.0000 mg | ORAL_TABLET | ORAL | Status: DC | PRN

## 2024-03-22 MED ORDER — LIDOCAINE-EPINEPHRINE 1 %-1:100000 IJ SOLN
INTRAMUSCULAR | Status: DC | PRN
  Administered 2024-03-22: 10 mL

## 2024-03-22 MED ORDER — ONDANSETRON HCL 4 MG/2ML IJ SOLN
INTRAMUSCULAR | Status: AC
  Filled 2024-03-22: qty 2

## 2024-03-22 MED ORDER — FENTANYL CITRATE (PF) 100 MCG/2ML IJ SOLN: 25.0000 ug | INTRAMUSCULAR | Status: DC | PRN

## 2024-03-22 MED ORDER — CIPROFLOXACIN IN D5W 400 MG/200ML IV SOLN
INTRAVENOUS | Status: AC
  Filled 2024-03-22: qty 200

## 2024-03-22 MED ORDER — ACETAMINOPHEN 500 MG PO TABS
ORAL_TABLET | ORAL | Status: AC
  Filled 2024-03-22: qty 2

## 2024-03-22 MED ORDER — VASHE WOUND IRRIGATION OPTIME
TOPICAL | Status: DC | PRN
  Administered 2024-03-22: 34 [oz_av]

## 2024-03-22 MED ORDER — SCOPOLAMINE 1 MG/3DAYS TD PT72
1.0000 | MEDICATED_PATCH | TRANSDERMAL | Status: DC
  Administered 2024-03-22: 1 mg via TRANSDERMAL

## 2024-03-22 NOTE — Anesthesia Procedure Notes (Signed)
 Procedure Name: LMA Insertion Date/Time: 03/22/2024 9:32 AM  Performed by: Casimir Lucie HERO, CRNAPre-anesthesia Checklist: Patient identified, Emergency Drugs available, Suction available and Patient being monitored Patient Re-evaluated:Patient Re-evaluated prior to induction Oxygen Delivery Method: Circle system utilized Preoxygenation: Pre-oxygenation with 100% oxygen Induction Type: IV induction Ventilation: Mask ventilation without difficulty LMA: LMA inserted LMA Size: 4.0 Number of attempts: 1 Airway Equipment and Method: Bite block Placement Confirmation: positive ETCO2 Tube secured with: Tape Dental Injury: Teeth and Oropharynx as per pre-operative assessment

## 2024-03-22 NOTE — Anesthesia Preprocedure Evaluation (Addendum)
"                                    Anesthesia Evaluation  Patient identified by MRN, date of birth, ID band Patient awake    Reviewed: Allergy & Precautions, NPO status , Patient's Chart, lab work & pertinent test results  History of Anesthesia Complications (+) PONV and history of anesthetic complications  Airway Mallampati: II  TM Distance: >3 FB Neck ROM: Full    Dental no notable dental hx.    Pulmonary asthma    Pulmonary exam normal        Cardiovascular negative cardio ROS Normal cardiovascular exam     Neuro/Psych  Headaches    GI/Hepatic negative GI ROS, Neg liver ROS,,,  Endo/Other  negative endocrine ROS    Renal/GU negative Renal ROS     Musculoskeletal negative musculoskeletal ROS (+)    Abdominal   Peds  Hematology negative hematology ROS (+)   Anesthesia Other Findings Breast ca  Reproductive/Obstetrics                              Anesthesia Physical Anesthesia Plan  ASA: 2  Anesthesia Plan: General   Post-op Pain Management: Tylenol  PO (pre-op)*   Induction: Intravenous  PONV Risk Score and Plan: 4 or greater and Treatment may vary due to age or medical condition, Ondansetron , Dexamethasone , Midazolam , Propofol  infusion and Scopolamine  patch - Pre-op  Airway Management Planned: Oral ETT  Additional Equipment: None  Intra-op Plan:   Post-operative Plan: Extubation in OR  Informed Consent: I have reviewed the patients History and Physical, chart, labs and discussed the procedure including the risks, benefits and alternatives for the proposed anesthesia with the patient or authorized representative who has indicated his/her understanding and acceptance.     Dental advisory given  Plan Discussed with: CRNA  Anesthesia Plan Comments:          Anesthesia Quick Evaluation  "

## 2024-03-22 NOTE — Transfer of Care (Signed)
 Immediate Anesthesia Transfer of Care Note  Patient: Jackie Miller  Procedure(s) Performed: REVISION, RECONSTRUCTION, BREAST (Right: Breast) CAPSULECTOMY, BREAST (Right: Breast)  Patient Location: PACU  Anesthesia Type:General  Level of Consciousness: drowsy and patient cooperative  Airway & Oxygen Therapy: Patient Spontanous Breathing  Post-op Assessment: Report given to RN and Post -op Vital signs reviewed and stable  Post vital signs: Reviewed and stable  Last Vitals:  Vitals Value Taken Time  BP 88/48 03/22/24 10:15  Temp 36 C 03/22/24 10:15  Pulse 72 03/22/24 10:15  Resp 11 03/22/24 10:15  SpO2 100 % 03/22/24 10:15  Vitals shown include unfiled device data.  Last Pain:  Vitals:   03/22/24 0732  TempSrc: Temporal  PainSc: 0-No pain      Patients Stated Pain Goal: 3 (03/22/24 0732)  Complications: No notable events documented.

## 2024-03-22 NOTE — Interval H&P Note (Signed)
 History and Physical Interval Note:  03/22/2024 8:46 AM  Jackie Miller  has presented today for surgery, with the diagnosis of Breast asymmetry following reconstructive surgery.  The various methods of treatment have been discussed with the patient and family. After consideration of risks, benefits and other options for treatment, the patient has consented to  Procedures with comments: REVISION, RECONSTRUCTION, BREAST (Bilateral) - Removal of bilateral excess tissue, possible repositioning of right breast implant CAPSULECTOMY, BREAST (Right) as a surgical intervention.  The patient's history has been reviewed, patient examined, no change in status, stable for surgery.  I have reviewed the patient's chart and labs.  Questions were answered to the patient's satisfaction.     Estefana RAMAN Brogan Martis

## 2024-03-22 NOTE — Op Note (Signed)
 Op report Unilateral Breast Exchange   DATE OF OPERATION:  03/22/2024  LOCATION: [facility]  SURGICAL DIVISION: Plastic Surgery  PREOPERATIVE DIAGNOSES:  1. History of breast cancer.  2. Asymmetry of right breast implant 3. Acquired absence of bilateral breast.   POSTOPERATIVE DIAGNOSES:  Same as preoperative diagnosis  PROCEDURE:  1. Capsulectomy of right breast for implant respositioning 3 x 8 cm  SURGEON: Jeanet Lupe, DO  ANESTHESIA:  General.   COMPLICATIONS: None.   INDICATIONS FOR PROCEDURE:  The patient, Jackie Miller, is a 44 y.o. female born on [birth year], is here for further treatment after a mastectomy and placement of a tissue expander. She now presents for symmetry of the right inframammary fold to the left. We won't remove skin as fat grafting will help fill the space.  MRN: [id number]  CONSENT:  Informed consent was obtained directly from the patient. Risks, benefits and alternatives were fully discussed. Specific risks including but not limited to bleeding, infection, hematoma, seroma, scarring, pain, implant infection, implant extrusion, capsular contracture, asymmetry, wound healing problems, and need for further surgery were all discussed. The patient did have an ample opportunity to have her questions answered to her satisfaction.   DESCRIPTION OF PROCEDURE:  The patient was taken to the operating room. SCDs were placed and IV antibiotics were given. The patient's chest was prepped and draped in a sterile fashion. A time out was performed and the implants to be used were identified.  Local with epinephrine  was used to infiltrate the area at the inframammary fold.   An incision was made at the inframammary fold.  The bovie was used to dissect to the capsule.  The capsule was thin and opened.  The implant was removed and placed in the Vashe.  Undermining was done for several centimeters to decrease the tension of closure. An area of 3 x 8  cm of capsule at the inframammary fold was excised.  Hemostasis was ensured with electrocautery.  The pocket was irrigated with Vashe solution. The inframammary fold and capsule were repositioned 2.5 cm superiorly.  This was done with 2-0 PDS.  New gloves were placed.  The implant was placed in the pocket and oriented appropriately using the keller funnel. The capsule was closed with a 3-0 PDS suture. The remaining skin was closed with 3-0 PDS and then a 3-0 Monocryl.  Dermabond and steri strips were applied.  A breast binder and ABD was applied.  The patient was allowed to wake from anesthesia and taken to the recovery room in satisfactory condition.

## 2024-03-22 NOTE — Discharge Instructions (Addendum)
 INSTRUCTIONS FOR AFTER BREAST SURGERY   You will likely have some questions about what to expect following your operation.  The following information will help you and your family understand what to expect when you are discharged from the hospital.  It is important to follow these guidelines to help ensure a smooth recovery and reduce complication.  Postoperative instructions include information on: diet, wound care, medications and physical activity.  AFTER SURGERY Expect to go home after the procedure.  In some cases, you may need to spend one night in the hospital for observation.  DIET Breast surgery does not require a specific diet.  However, the healthier you eat the better your body will heal. It is important to increasing your protein intake.  This means limiting the foods with sugar and carbohydrates.  Focus on vegetables and some meat.  If you have liposuction during your procedure be sure to drink water.  If your urine is bright yellow, then it is concentrated, and you need to drink more water.  As a general rule after surgery, you should have 8 ounces of water every hour while awake.  If you find you are persistently nauseated or unable to take in liquids let us  know.  NO TOBACCO USE or EXPOSURE.  This will slow your healing process and lead to a wound.  WOUND CARE Leave the binder on for 3 days . Use fragrance free soap like Dial, Dove or Ivory.   After 3 days you can remove the binder to shower. Once dry apply binder or sports bra. If you have liposuction you will have a soft and spongy dressing (Lipofoam) that helps prevent creases in your skin.  Remove before you shower and then replace it.  It is also available on [employer]. If you have steri-strips / tape directly attached to your skin leave them in place. It is OK to get these wet.   No baths, pools or hot tubs for four weeks. We close your incision to leave the smallest and best-looking scar. No ointment or creams on your incisions  for four weeks.  No Neosporin (Too many skin reactions).  A few weeks after surgery you can use Mederma and start massaging the scar. We ask you to wear your binder or sports bra for the first 6 weeks around the clock, including while sleeping. This provides added comfort and helps reduce the fluid accumulation at the surgery site. NO Ice or heating pads to the operative site.  You have a very high risk of a BURN before you feel the temperature change.  ACTIVITY No heavy lifting until cleared by the doctor.  This usually means no more than a half-gallon of milk.  It is OK to walk and climb stairs. Moving your legs is very important to decrease your risk of a blood clot.  It will also help keep you from getting deconditioned.  Every 1 to 2 hours get up and walk for 5 minutes. This will help with a quicker recovery back to normal.  Let pain be your guide so you don't do too much.  This time is for you to recover.  You will be more comfortable if you sleep and rest with your head elevated either with a few pillows under you or in a recliner.  No stomach sleeping for a three months.  WORK Everyone returns to work at different times. As a rough guide, most people take at least 1 - 2 weeks off prior to returning to work. If  you need documentation for your job, give the forms to the front staff at the clinic.  DRIVING Arrange for someone to bring you home from the hospital after your surgery.  You may be able to drive a few days after surgery but not while taking any narcotics or valium .  BOWEL MOVEMENTS Constipation can occur after anesthesia and while taking pain medication.  It is important to stay ahead for your comfort.  We recommend taking Milk of Magnesia (2 tablespoons; twice a day) while taking the pain pills.  MEDICATIONS You may be prescribed should start after surgery At your preoperative visit for you history and physical you were given the following medications: Antibiotic: Start this  medication when you get home and take according to the instructions on the bottle. Zofran  4 mg:  This is to treat nausea and vomiting.  You can take this every 6 hours as needed and only if needed. Oxycodone  5 mg every 6 hours for 3 - 5 days.  This is to be used after you have taken the Motrin  or the Tylenol . 4.   Gabapentin 300 mg every 12 hours for 7 days.  Over the counter Medication to take: Ibuprofen  (Motrin ) 400 - 600 mg every 6 hour for 7 days Tylenol  500 mg every 6 hours for 7 days.  Only take the Oxycodone  after you have tried these two. MiraLAX or stool softener of choice: Take this according to the bottle if you take the Norco.  If muscle work done:  Flexeril 5 mg every 12 hours for 7 days.  WHEN TO CALL Call your surgeon's office if any of the following occur: Fever 101 degrees F or greater Excessive bleeding or fluid from the incision site. Pain that increases over time without aid from the medications Redness, warmth, or pus draining from incision sites Persistent nausea or inability to take in liquids Severe misshapen area that underwent the operation.   Post Anesthesia Home Care Instructions  Activity: Get plenty of rest for the remainder of the day. A responsible individual must stay with you for 24 hours following the procedure.  For the next 24 hours, DO NOT: -Drive a car -Advertising copywriter -Drink alcoholic beverages -Take any medication unless instructed by your physician -Make any legal decisions or sign important papers.  Meals: Start with liquid foods such as gelatin or soup. Progress to regular foods as tolerated. Avoid greasy, spicy, heavy foods. If nausea and/or vomiting occur, drink only clear liquids until the nausea and/or vomiting subsides. Call your physician if vomiting continues.  Special Instructions/Symptoms: Your throat may feel dry or sore from the anesthesia or the breathing tube placed in your throat during surgery. If this causes  discomfort, gargle with warm salt water. The discomfort should disappear within 24 hours.  If you had a scopolamine  patch placed behind your ear for the management of post- operative nausea and/or vomiting:  1. The medication in the patch is effective for 72 hours, after which it should be removed.  Wrap patch in a tissue and discard in the trash. Wash hands thoroughly with soap and water. 2. You may remove the patch earlier than 72 hours if you experience unpleasant side effects which may include dry mouth, dizziness or visual disturbances. 3. Avoid touching the patch. Wash your hands with soap and water after contact with the patch.

## 2024-03-22 NOTE — Anesthesia Postprocedure Evaluation (Signed)
"   Anesthesia Post Note  Patient: Jackie Miller  Procedure(s) Performed: REVISION, RECONSTRUCTION, BREAST (Right: Breast) CAPSULECTOMY, BREAST (Right: Breast)     Patient location during evaluation: PACU Anesthesia Type: General Level of consciousness: awake and alert Pain management: pain level controlled Vital Signs Assessment: post-procedure vital signs reviewed and stable Respiratory status: spontaneous breathing, nonlabored ventilation and respiratory function stable Cardiovascular status: blood pressure returned to baseline Postop Assessment: no apparent nausea or vomiting Anesthetic complications: no   No notable events documented.  Last Vitals:  Vitals:   03/22/24 1045 03/22/24 1100  BP: 108/66   Pulse: 67 68  Resp: 12 13  Temp:    SpO2: 100% 99%    Last Pain:  Vitals:   03/22/24 1100  TempSrc:   PainSc: 2    Pain Goal: Patients Stated Pain Goal: 3 (03/22/24 0732)                 Vertell Row      "

## 2024-03-23 ENCOUNTER — Encounter (HOSPITAL_BASED_OUTPATIENT_CLINIC_OR_DEPARTMENT_OTHER): Payer: Self-pay | Admitting: Plastic Surgery

## 2024-03-30 ENCOUNTER — Encounter: Admitting: Student

## 2024-04-10 ENCOUNTER — Encounter: Admitting: Plastic Surgery

## 2024-04-18 ENCOUNTER — Inpatient Hospital Stay: Admitting: Hematology and Oncology

## 2024-05-04 ENCOUNTER — Encounter: Admitting: Plastic Surgery

## 2024-07-23 ENCOUNTER — Ambulatory Visit

## 2024-10-25 ENCOUNTER — Encounter: Admitting: Family Medicine
